# Patient Record
Sex: Male | Born: 1950 | Hispanic: Yes | Marital: Married | Smoking: Never smoker
Health system: Southern US, Community
[De-identification: ages and names within clinical notes are randomized; demographics above are authoritative.]

## PROBLEM LIST (undated history)

## (undated) DIAGNOSIS — K219 Gastro-esophageal reflux disease without esophagitis: Secondary | ICD-10-CM

## (undated) DIAGNOSIS — R112 Nausea with vomiting, unspecified: Secondary | ICD-10-CM

## (undated) DIAGNOSIS — D303 Benign neoplasm of bladder: Secondary | ICD-10-CM

## (undated) DIAGNOSIS — M1990 Unspecified osteoarthritis, unspecified site: Secondary | ICD-10-CM

## (undated) DIAGNOSIS — N281 Cyst of kidney, acquired: Secondary | ICD-10-CM

## (undated) DIAGNOSIS — T8859XA Other complications of anesthesia, initial encounter: Secondary | ICD-10-CM

## (undated) DIAGNOSIS — R3915 Urgency of urination: Secondary | ICD-10-CM

## (undated) DIAGNOSIS — Z9889 Other specified postprocedural states: Secondary | ICD-10-CM

## (undated) DIAGNOSIS — F32A Depression, unspecified: Secondary | ICD-10-CM

## (undated) DIAGNOSIS — Z87440 Personal history of urinary (tract) infections: Secondary | ICD-10-CM

## (undated) DIAGNOSIS — R102 Pelvic and perineal pain: Secondary | ICD-10-CM

## (undated) DIAGNOSIS — G4733 Obstructive sleep apnea (adult) (pediatric): Secondary | ICD-10-CM

## (undated) DIAGNOSIS — N3289 Other specified disorders of bladder: Secondary | ICD-10-CM

## (undated) DIAGNOSIS — Z9989 Dependence on other enabling machines and devices: Secondary | ICD-10-CM

## (undated) DIAGNOSIS — N3010 Interstitial cystitis (chronic) without hematuria: Secondary | ICD-10-CM

## (undated) DIAGNOSIS — R350 Frequency of micturition: Secondary | ICD-10-CM

## (undated) DIAGNOSIS — R011 Cardiac murmur, unspecified: Secondary | ICD-10-CM

## (undated) DIAGNOSIS — R351 Nocturia: Secondary | ICD-10-CM

## (undated) DIAGNOSIS — I10 Essential (primary) hypertension: Secondary | ICD-10-CM

## (undated) DIAGNOSIS — R3911 Hesitancy of micturition: Secondary | ICD-10-CM

## (undated) DIAGNOSIS — L570 Actinic keratosis: Secondary | ICD-10-CM

## (undated) HISTORY — DX: Interstitial cystitis (chronic) without hematuria: N30.10

## (undated) HISTORY — PX: KNEE ARTHROSCOPY: SUR90

## (undated) HISTORY — DX: Actinic keratosis: L57.0

## (undated) HISTORY — PX: TRANSURETHRAL RESECTION OF PROSTATE: SHX73

## (undated) HISTORY — PX: JOINT REPLACEMENT: SHX530

## (undated) HISTORY — PX: COLONOSCOPY: SHX174

## (undated) SURGERY — Surgical Case
Anesthesia: *Unknown

---

## 2009-11-20 HISTORY — PX: INCISION OF BLADDER NECK CONTRACTURE: SHX1811

## 2010-01-22 ENCOUNTER — Emergency Department: Payer: Self-pay | Admitting: Emergency Medicine

## 2010-07-28 ENCOUNTER — Emergency Department: Payer: Self-pay | Admitting: Emergency Medicine

## 2010-08-05 ENCOUNTER — Ambulatory Visit: Payer: Self-pay | Admitting: Urology

## 2010-08-24 ENCOUNTER — Ambulatory Visit: Payer: Self-pay | Admitting: Urology

## 2010-08-25 ENCOUNTER — Ambulatory Visit: Payer: Self-pay | Admitting: Urology

## 2010-10-28 ENCOUNTER — Ambulatory Visit: Payer: Self-pay | Admitting: Gastroenterology

## 2010-11-01 LAB — PATHOLOGY REPORT

## 2011-01-17 DIAGNOSIS — N400 Enlarged prostate without lower urinary tract symptoms: Secondary | ICD-10-CM | POA: Insufficient documentation

## 2011-01-17 DIAGNOSIS — R109 Unspecified abdominal pain: Secondary | ICD-10-CM

## 2011-01-17 HISTORY — DX: Unspecified abdominal pain: R10.9

## 2011-01-31 ENCOUNTER — Ambulatory Visit: Payer: Self-pay | Admitting: Urology

## 2011-04-04 DIAGNOSIS — E785 Hyperlipidemia, unspecified: Secondary | ICD-10-CM | POA: Insufficient documentation

## 2011-04-04 DIAGNOSIS — L989 Disorder of the skin and subcutaneous tissue, unspecified: Secondary | ICD-10-CM | POA: Insufficient documentation

## 2011-04-11 ENCOUNTER — Encounter (HOSPITAL_BASED_OUTPATIENT_CLINIC_OR_DEPARTMENT_OTHER): Payer: Self-pay | Admitting: Anesthesiology

## 2011-05-07 ENCOUNTER — Emergency Department: Payer: Self-pay | Admitting: Unknown Physician Specialty

## 2011-05-08 ENCOUNTER — Emergency Department: Payer: Self-pay | Admitting: Emergency Medicine

## 2011-05-30 ENCOUNTER — Other Ambulatory Visit: Payer: Self-pay | Admitting: Urology

## 2011-05-31 ENCOUNTER — Encounter (HOSPITAL_BASED_OUTPATIENT_CLINIC_OR_DEPARTMENT_OTHER): Payer: Self-pay | Admitting: *Deleted

## 2011-05-31 NOTE — Progress Notes (Addendum)
SPOKE W/ PT WIFE, SPEAKS ONLY SPANISH.  ALL INFO OBTAINED THRU PACIFIC INTERPRETOR [phone].  NPO AFTER MN. ARRIVES AT 1100. NEEDS ISTAT AND EKG. WILL TAKE AMLODIPINE AM OF SURG. W/ SIP OF WATER. PT TO BRING LIST OF MEDS.   SPANISH INTERPRETOR REQUESTED AND ARRANGED TO ARRIVE AT 1100 [phone]).

## 2011-06-01 NOTE — H&P (Signed)
History of Present Illness   [name] was here for urodynamics today. He has had 3 TUR procedures and he voids every hour because of pressure. The pressure is relieved when he voids. He is [place] he received his treatment. He reports a very slow dribbling flow and can almost leak on himself when the flow is slow. He has diffuse suprapubic discomfort that comes and goes. He is currently on Flomax and finasteride. He has had a stricture in the past.   He has tried Rapaflo. I had not seen a recent PSA and he had a small pea-size nodule on the right lateral lobe of the prostate.   He has had a negative cystoscopy and negative CT scan by Vincent Cole. I felt that he could have interstitial cystitis, but my index of suspicion was lower. He voids every 1 to 2 hours and gets up 4 or 5 times a night and has a slow flow. He has infrequent ankle edema.   Review of systems: No change in bowel or neurologic systems.  Unfortunately, I could insert a urodynamics catheter today. The nurses in urodynamics tried the usual techniques, including piggybacking the catheter with different size catheters and coud catheters. I tried the same and failed.   Cystoscopy: The patient underwent cystoscopy to assess for a bladder neck contracture. The penile bulbar and sphincter looked normal. He had a short prostatic urethra. He had a ski-jump effect at 6 o' clock with an open bladder neck.  I could quite readily insert a urodynamics catheter to the bladder neck, but I could not thread it into the bladder under cystoscopic guidance doing the piggyback type maneuver. Oddly, twice the urodynamics catheter would curl up in the penile urethra and with all techniques described above he seemed to have a tight urethra throughout his entire length. In my opinion, this explained why I could not thread the urodynamics catheter beside the cystoscopy.  Review of systems: No change in bowel or neurologic systems.    Past  Medical History Problems  1. History of  Adult Sleep Apnea 780.57 2. History of  Anxiety (Symptom) 300.00 3. History of  Hypercholesterolemia 272.0 4. History of  Hypertension 401.9 5. History of  Murmurs 785.2  Surgical History Problems  1. History of  Knee Surgery 2. History of  Transurethral Resection Of Prostate (TURP)  Current Meds 1. AmLODIPine Besylate 10 MG Oral Tablet; Therapy: (Recorded:10Oct2012) to 2. Finasteride TABS; Therapy: (Recorded:10Oct2012) to 3. Flomax 0.4 MG Oral Capsule; Therapy: (Recorded:10Oct2012) to 4. Ibuprofen CAPS; Therapy: (Recorded:10Oct2012) to  Allergies Medication  1. No Known Drug Allergies  Family History Problems  1. Maternal uncle's history of  Cancer 2. Maternal uncle's history of  Cancer  Social History Problems  1. Alcohol Use 2. Caffeine Use 3. Marital History - Currently Married 4. Never A Smoker 5. Occupation: Games developer Assessed  1. Urinary Stream Is Smaller 788.62 2. Abdominal Pain 789.00  Plan Urinary Stream Is Smaller (788.62)  1. Cysto  Done: 05Nov2012  Discussion/Summary Vincent Cole has a slow flow, but I could not perform urodynamics today.  He does not have a clinically significant stricture or bladder neck contracture.  With 3 previous TUR procedures, it is more doubtful that he still is obstructed.  His pain could be from an inflamed bladder.    Vincent Cole and I talked about a hydrodistension.  We talked about cystoscopy/hydrodistension and instillation in detail. Pros, cons, general surgical and anesthetic risks, and other  options including watchful waiting were discussed. Risks were described but not limited to pain, infection, and bleeding. The risk of bladder perforation and management were discussed. The patient understands that it is primarily a diagnostic procedure.   He would like to proceed with a hydrodistension. I went over his flow symptoms in detail and he says on a few occasions he  has actually gone into retention and he would his abdominal pain syndrome and the retention sorted out. For this reason, we are going to attempt the following.  After his hydrodistension, I am going to place a urodynamics catheter in him. We will tape it to the penis. After he wakes up and eats, etc., we will do urodynamics following, recognizing some of the limitations associated with this. It should not particularly effect the voiding phase.  After a thorough review of the management options for the patient's condition the patient  elected to proceed with surgical therapy as noted above. We have discussed the potential benefits and risks of the procedure, side effects of the proposed treatment, the likelihood of the patient achieving the goals of the procedure, and any potential problems that might occur during the procedure or recuperation. Informed consent has been obtained.

## 2011-06-02 ENCOUNTER — Encounter (HOSPITAL_BASED_OUTPATIENT_CLINIC_OR_DEPARTMENT_OTHER): Payer: Self-pay | Admitting: Anesthesiology

## 2011-06-02 ENCOUNTER — Ambulatory Visit (HOSPITAL_BASED_OUTPATIENT_CLINIC_OR_DEPARTMENT_OTHER): Admission: RE | Admit: 2011-06-02 | Payer: Self-pay | Source: Ambulatory Visit | Admitting: Urology

## 2011-06-02 ENCOUNTER — Encounter (HOSPITAL_BASED_OUTPATIENT_CLINIC_OR_DEPARTMENT_OTHER): Payer: Self-pay | Admitting: *Deleted

## 2011-06-02 ENCOUNTER — Encounter (HOSPITAL_BASED_OUTPATIENT_CLINIC_OR_DEPARTMENT_OTHER): Admission: RE | Payer: Self-pay | Source: Ambulatory Visit

## 2011-06-02 ENCOUNTER — Ambulatory Visit (HOSPITAL_BASED_OUTPATIENT_CLINIC_OR_DEPARTMENT_OTHER): Payer: Federal, State, Local not specified - PPO | Admitting: Anesthesiology

## 2011-06-02 ENCOUNTER — Other Ambulatory Visit: Payer: Self-pay

## 2011-06-02 ENCOUNTER — Encounter (HOSPITAL_BASED_OUTPATIENT_CLINIC_OR_DEPARTMENT_OTHER)
Admission: RE | Disposition: A | Discharge status: Other Acute Inpatient Hospital | Payer: Self-pay | Source: Ambulatory Visit | Attending: Urology

## 2011-06-02 ENCOUNTER — Ambulatory Visit (HOSPITAL_BASED_OUTPATIENT_CLINIC_OR_DEPARTMENT_OTHER)
Admission: RE | Admit: 2011-06-02 | Discharge: 2011-06-02 | Payer: Federal, State, Local not specified - PPO | Source: Ambulatory Visit | Attending: Urology | Admitting: Urology

## 2011-06-02 DIAGNOSIS — G4730 Sleep apnea, unspecified: Secondary | ICD-10-CM | POA: Insufficient documentation

## 2011-06-02 DIAGNOSIS — R39198 Other difficulties with micturition: Secondary | ICD-10-CM | POA: Insufficient documentation

## 2011-06-02 DIAGNOSIS — N3010 Interstitial cystitis (chronic) without hematuria: Secondary | ICD-10-CM | POA: Insufficient documentation

## 2011-06-02 DIAGNOSIS — R109 Unspecified abdominal pain: Secondary | ICD-10-CM | POA: Insufficient documentation

## 2011-06-02 DIAGNOSIS — E7800 Pure hypercholesterolemia, unspecified: Secondary | ICD-10-CM | POA: Insufficient documentation

## 2011-06-02 DIAGNOSIS — I10 Essential (primary) hypertension: Secondary | ICD-10-CM | POA: Insufficient documentation

## 2011-06-02 DIAGNOSIS — Z79899 Other long term (current) drug therapy: Secondary | ICD-10-CM | POA: Insufficient documentation

## 2011-06-02 HISTORY — DX: Dependence on other enabling machines and devices: Z99.89

## 2011-06-02 HISTORY — DX: Essential (primary) hypertension: I10

## 2011-06-02 HISTORY — DX: Personal history of urinary (tract) infections: Z87.440

## 2011-06-02 HISTORY — DX: Obstructive sleep apnea (adult) (pediatric): G47.33

## 2011-06-02 HISTORY — DX: Gastro-esophageal reflux disease without esophagitis: K21.9

## 2011-06-02 HISTORY — DX: Nocturia: R35.1

## 2011-06-02 HISTORY — DX: Cyst of kidney, acquired: N28.1

## 2011-06-02 HISTORY — DX: Benign neoplasm of bladder: D30.3

## 2011-06-02 HISTORY — DX: Pelvic and perineal pain: R10.2

## 2011-06-02 HISTORY — DX: Urgency of urination: R39.15

## 2011-06-02 HISTORY — DX: Frequency of micturition: R35.0

## 2011-06-02 HISTORY — DX: Hesitancy of micturition: R39.11

## 2011-06-02 HISTORY — PX: CYSTO WITH HYDRODISTENSION: SHX5453

## 2011-06-02 HISTORY — DX: Other specified disorders of bladder: N32.89

## 2011-06-02 LAB — POCT I-STAT 4, (NA,K, GLUC, HGB,HCT)
Glucose, Bld: 97 mg/dL (ref 70–99)
Hemoglobin: 13.9 g/dL (ref 13.0–17.0)
Potassium: 3.8 mEq/L (ref 3.5–5.1)
Sodium: 142 mEq/L (ref 135–145)

## 2011-06-02 SURGERY — CYSTOSCOPY, WITH BLADDER HYDRODISTENSION
Anesthesia: General | Site: Bladder | Wound class: Clean Contaminated

## 2011-06-02 SURGERY — CYSTOSCOPY, WITH BLADDER HYDRODISTENSION
Anesthesia: General

## 2011-06-02 MED ORDER — LACTATED RINGERS IV SOLN
INTRAVENOUS | Status: DC
  Administered 2011-06-02: 11:00:00 via INTRAVENOUS

## 2011-06-02 MED ORDER — PROPOFOL 10 MG/ML IV EMUL
INTRAVENOUS | Status: DC | PRN
  Administered 2011-06-02: 200 mg via INTRAVENOUS

## 2011-06-02 MED ORDER — CIPROFLOXACIN HCL 250 MG PO TABS: 250.0000 mg | ORAL_TABLET | Freq: Two times a day (BID) | ORAL | Status: AC

## 2011-06-02 MED ORDER — IOHEXOL 350 MG/ML SOLN
INTRAVENOUS | Status: DC | PRN
  Administered 2011-06-02: 5 mL via INTRAVENOUS

## 2011-06-02 MED ORDER — DEXAMETHASONE SODIUM PHOSPHATE 4 MG/ML IJ SOLN
INTRAMUSCULAR | Status: DC | PRN
  Administered 2011-06-02: 8 mg via INTRAVENOUS

## 2011-06-02 MED ORDER — FENTANYL CITRATE 0.05 MG/ML IJ SOLN
INTRAMUSCULAR | Status: DC | PRN
  Administered 2011-06-02 (×2): 50 ug via INTRAVENOUS

## 2011-06-02 MED ORDER — HYDROCODONE-ACETAMINOPHEN 5-500 MG PO TABS: 1.0000 | ORAL_TABLET | Freq: Four times a day (QID) | ORAL | Status: AC | PRN

## 2011-06-02 MED ORDER — PROMETHAZINE HCL 25 MG/ML IJ SOLN: 6.2500 mg | INTRAMUSCULAR | Status: DC | PRN

## 2011-06-02 MED ORDER — PHENAZOPYRIDINE HCL 200 MG PO TABS: ORAL | Status: DC | PRN

## 2011-06-02 MED ORDER — LIDOCAINE HCL (CARDIAC) 20 MG/ML IV SOLN
INTRAVENOUS | Status: DC | PRN
  Administered 2011-06-02: 100 mg via INTRAVENOUS

## 2011-06-02 MED ORDER — LACTATED RINGERS IV SOLN: INTRAVENOUS | Status: DC

## 2011-06-02 MED ORDER — STERILE WATER FOR IRRIGATION IR SOLN
Status: DC | PRN
  Administered 2011-06-02: 3000 mL

## 2011-06-02 MED ORDER — MIDAZOLAM HCL 5 MG/5ML IJ SOLN
INTRAMUSCULAR | Status: DC | PRN
  Administered 2011-06-02: 2 mg via INTRAVENOUS

## 2011-06-02 MED ORDER — FENTANYL CITRATE 0.05 MG/ML IJ SOLN: 25.0000 ug | INTRAMUSCULAR | Status: DC | PRN

## 2011-06-02 MED ORDER — CIPROFLOXACIN IN D5W 400 MG/200ML IV SOLN
400.0000 mg | INTRAVENOUS | Status: AC
  Administered 2011-06-02: 400 mg via INTRAVENOUS

## 2011-06-02 MED ORDER — MEPERIDINE HCL 25 MG/ML IJ SOLN: 6.2500 mg | INTRAMUSCULAR | Status: DC | PRN

## 2011-06-02 MED ORDER — ONDANSETRON HCL 4 MG/2ML IJ SOLN
INTRAMUSCULAR | Status: DC | PRN
  Administered 2011-06-02: 4 mg via INTRAVENOUS

## 2011-06-02 SURGICAL SUPPLY — 20 items
BAG DRAIN URO-CYSTO SKYTR STRL (DRAIN) ×2 IMPLANT
CANISTER SUCT LVC 12 LTR MEDI- (MISCELLANEOUS) IMPLANT
CATH FOLEY 2WAY SLVR  5CC 18FR (CATHETERS)
CATH FOLEY 2WAY SLVR 5CC 18FR (CATHETERS) IMPLANT
CATH ROBINSON RED A/P 12FR (CATHETERS) IMPLANT
CATH ROBINSON RED A/P 14FR (CATHETERS) IMPLANT
CLOTH BEACON ORANGE TIMEOUT ST (SAFETY) ×2 IMPLANT
DRAPE CAMERA CLOSED 9X96 (DRAPES) ×2 IMPLANT
ELECT REM PT RETURN 9FT ADLT (ELECTROSURGICAL)
ELECTRODE REM PT RTRN 9FT ADLT (ELECTROSURGICAL) IMPLANT
GLOVE BIO SURGEON STRL SZ7.5 (GLOVE) ×2 IMPLANT
GLOVE ECLIPSE 6.0 STRL STRAW (GLOVE) ×2 IMPLANT
GLOVE INDICATOR 6.5 STRL GRN (GLOVE) ×2 IMPLANT
GOWN STRL REIN XL XLG (GOWN DISPOSABLE) ×2 IMPLANT
NDL SAFETY ECLIPSE 18X1.5 (NEEDLE) ×1 IMPLANT
NEEDLE HYPO 18GX1.5 SHARP (NEEDLE) ×1
PACK CYSTOSCOPY (CUSTOM PROCEDURE TRAY) ×2 IMPLANT
SUT SILK 0 TIES 10X30 (SUTURE) IMPLANT
SYR 20CC LL (SYRINGE) ×2 IMPLANT
WATER STERILE IRR 3000ML UROMA (IV SOLUTION) ×2 IMPLANT

## 2011-06-02 NOTE — Transfer of Care (Signed)
Immediate Anesthesia Transfer of Care Note  Patient: Vincent Cole  Procedure(s) Performed:  CYSTOSCOPY/HYDRODISTENSION - catheter placement  Patient Location: PACU  Anesthesia Type: General  Level of Consciousness: awake, oriented, sedated and patient cooperative  Airway & Oxygen Therapy: Patient Spontanous Breathing and Patient connected to face mask oxygen  Post-op Assessment: Report given to PACU RN and Post -op Vital signs reviewed and stable  Post vital signs: Reviewed and stable  Complications: No apparent anesthesia complications

## 2011-06-02 NOTE — Anesthesia Procedure Notes (Signed)
Procedure Name: LMA Insertion Date/Time: 06/02/2011 12:01 PM Performed by: Renella Cunas D Pre-anesthesia Checklist: Patient identified, Emergency Drugs available, Suction available and Patient being monitored Patient Re-evaluated:Patient Re-evaluated prior to inductionOxygen Delivery Method: Circle System Utilized Preoxygenation: Pre-oxygenation with 100% oxygen Intubation Type: IV induction Ventilation: Mask ventilation without difficulty LMA: LMA inserted LMA Size: 4.0 Number of attempts: 1 Airway Equipment and Method: bite block Placement Confirmation: positive ETCO2 Tube secured with: Tape Dental Injury: Teeth and Oropharynx as per pre-operative assessment

## 2011-06-02 NOTE — Anesthesia Preprocedure Evaluation (Signed)
Anesthesia Evaluation  Patient identified by MRN, date of birth, ID band Patient awake    Reviewed: Allergy & Precautions, H&P , NPO status , Patient's Chart, lab work & pertinent test results  Airway Mallampati: II TM Distance: >3 FB Neck ROM: Full    Dental No notable dental hx.    Pulmonary neg pulmonary ROS, sleep apnea and Continuous Positive Airway Pressure Ventilation ,  clear to auscultation  Pulmonary exam normal       Cardiovascular hypertension, Pt. on medications neg cardio ROS Regular Normal    Neuro/Psych Negative Neurological ROS  Negative Psych ROS   GI/Hepatic negative GI ROS, Neg liver ROS,   Endo/Other  Negative Endocrine ROS  Renal/GU negative Renal ROS  Genitourinary negative   Musculoskeletal negative musculoskeletal ROS (+)   Abdominal   Peds negative pediatric ROS (+)  Hematology negative hematology ROS (+)   Anesthesia Other Findings   Reproductive/Obstetrics negative OB ROS                           Anesthesia Physical Anesthesia Plan  ASA: II  Anesthesia Plan: General   Post-op Pain Management:    Induction: Intravenous  Airway Management Planned: LMA  Additional Equipment:   Intra-op Plan:   Post-operative Plan: Extubation in OR  Informed Consent: I have reviewed the patients History and Physical, chart, labs and discussed the procedure including the risks, benefits and alternatives for the proposed anesthesia with the patient or authorized representative who has indicated his/her understanding and acceptance.   Dental advisory given  Plan Discussed with: CRNA  Anesthesia Plan Comments:         Anesthesia Quick Evaluation

## 2011-06-02 NOTE — Anesthesia Postprocedure Evaluation (Signed)
  Anesthesia Post-op Note  Patient: Vincent Cole  Procedure(s) Performed:  CYSTOSCOPY/HYDRODISTENSION - catheter placement  Patient Location: PACU  Anesthesia Type: General  Level of Consciousness: awake and alert   Airway and Oxygen Therapy: Patient Spontanous Breathing  Post-op Pain: mild  Post-op Assessment: Post-op Vital signs reviewed, Patient's Cardiovascular Status Stable, Respiratory Function Stable, Patent Airway and No signs of Nausea or vomiting  Post-op Vital Signs: stable  Complications: No apparent anesthesia complications

## 2011-06-02 NOTE — Interval H&P Note (Signed)
History and Physical Interval Note:  06/02/2011 7:04 AM  Vincent Cole  has presented today for surgery, with the diagnosis of PELVIC PAIN  The various methods of treatment have been discussed with the patient and family. After consideration of risks, benefits and other options for treatment, the patient has consented to  Procedure(s): CYSTOSCOPY/HYDRODISTENSION as a surgical intervention .  The patients' history has been reviewed, patient examined, no change in status, stable for surgery.  I have reviewed the patients' chart and labs.  Questions were answered to the patient's satisfaction.     [name]

## 2011-06-02 NOTE — Op Note (Signed)
Preoperative diagnosis: Pelvic pain and poor flow Postoperative diagnosis: Interstitial cystitis and poor flow Surgery: Bladder hydrodistention plus cystoscopy plus insertion of urodynamics catheter and cystogram Surgeon Dr. Lorin Picket Lamanda Rudder  This patient has chronic pelvic pain and I was trying to sort out of the initial cystitis. Preoperative laboratory tests were normal. Leg position was good. Preoperative antibiotics were given  He had mild meatal stenosis a 52 [place] scope was utilized. The penile bulbar membranous and prostatic urethra were visualized. Distally all urethra was normal. He had a very deep ski jump at the bladder neck the 2 previous transurethral resections of the prostate. He grade 1 or grade 2/4 bladder trabeculation. There is no stitch form body or carcinoma.  He was hydrodistended to 550 mL. The bladder was emptied. On reinspection he mild diffuse glomerulations but no active bleeding and no injury  Because of the anatomy I cystoscoped I. urodynamics catheter up into the bladder neck curling in the bladder.  A separate procedure I did a quick cystogram using some contrast and there is no question the length of the urinary catheter was curled nicely in his bladder. There were no bladder abnormalities or reflux. I taped securely to the penis with my usual technique.  Patient was sent to recovery room will have urodynamics saphena

## 2011-06-03 ENCOUNTER — Encounter (HOSPITAL_BASED_OUTPATIENT_CLINIC_OR_DEPARTMENT_OTHER): Payer: Self-pay | Admitting: Urology

## 2011-06-08 ENCOUNTER — Encounter (HOSPITAL_BASED_OUTPATIENT_CLINIC_OR_DEPARTMENT_OTHER): Payer: Self-pay

## 2011-07-04 DIAGNOSIS — N3010 Interstitial cystitis (chronic) without hematuria: Secondary | ICD-10-CM | POA: Insufficient documentation

## 2012-07-25 ENCOUNTER — Emergency Department: Payer: Self-pay

## 2013-05-02 ENCOUNTER — Ambulatory Visit (INDEPENDENT_AMBULATORY_CARE_PROVIDER_SITE_OTHER): Payer: Federal, State, Local not specified - PPO | Admitting: Podiatry

## 2013-05-02 ENCOUNTER — Ambulatory Visit (INDEPENDENT_AMBULATORY_CARE_PROVIDER_SITE_OTHER): Payer: Federal, State, Local not specified - PPO

## 2013-05-02 ENCOUNTER — Encounter: Payer: Self-pay | Admitting: Podiatry

## 2013-05-02 VITALS — BP 164/97 | HR 98 | Resp 18 | Ht 65.5 in | Wt 244.0 lb

## 2013-05-02 DIAGNOSIS — M79671 Pain in right foot: Secondary | ICD-10-CM

## 2013-05-02 DIAGNOSIS — G5790 Unspecified mononeuropathy of unspecified lower limb: Secondary | ICD-10-CM

## 2013-05-02 DIAGNOSIS — G5791 Unspecified mononeuropathy of right lower limb: Secondary | ICD-10-CM

## 2013-05-02 DIAGNOSIS — M19079 Primary osteoarthritis, unspecified ankle and foot: Secondary | ICD-10-CM

## 2013-05-02 DIAGNOSIS — L608 Other nail disorders: Secondary | ICD-10-CM

## 2013-05-02 DIAGNOSIS — M79609 Pain in unspecified limb: Secondary | ICD-10-CM

## 2013-05-02 MED ORDER — MELOXICAM 7.5 MG PO TABS: 7.5000 mg | ORAL_TABLET | Freq: Every day | ORAL | Status: DC

## 2013-05-02 NOTE — Patient Instructions (Signed)

## 2013-05-02 NOTE — Progress Notes (Signed)
   Subjective:    Patient ID: Vincent Cole, male    DOB: [birth year], 62 y.o.   MRN: [id number]  HPI Comments: " i have a bunion on my right foot that makes my toe feel numb, also my left foot sometimes gets a dull ache in it .  N numb    Dull ache  Fungus on left great toenail  L right foot , left foot top of foot dull ache  D 3 years ago , couple of months ago for the left foot  O gradual,    All of a sudden  C progressively getting worse,  The left foot it does not happen all the time  A standing for long period of time  T no treatment   Foot Pain      Review of Systems  All other systems reviewed and are negative.       Objective:   Physical Exam: I reviewed his past medical history medications allergies surgeries social history. Vital signs are stable he is alert and oriented x3. Vascular evaluation demonstrates strong palpable pulses bilateral. Capillary fill time to digits one through 5 is noted to be immediate. Neurologic sensorium is intact per Semmes-Weinstein monofilament. Deep tendon reflexes are intact and brisk bilateral. Muscle strength is 5 over 5 dorsiflexors plantar flexors inverters everters all intrinsic musculature is intact. Orthopedic evaluation demonstrates all joints distal to the ankle a full range of motion without crepitus he has pain on frontal plane range of motion of the midfoot left. He has numbness on palpation from the medial IP joint of the hallux distally to the tuft of the toe right. He can use evaluation demonstrates supple while hydrated cutis hallux nails are thick yellow dystrophic care rule out mycosis at this point. Radiographs evaluation left foot demonstrates early osteoarthritic changes. Mild hallux abductovalgus deformity right possibly associated with his numbness and tingling.        Assessment & Plan:  Assessment: Neuritis right IP joint hallux. 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  Plan: Discussed etiology pathology conservative versus surgical therapies. Injected a small amount of dexamethasone and local anesthetic to the medial aspect of the hallux right for the neuritis. Should samples of both great toenails send for mycotic evaluation. I wrote her prescription for Mobic 7.5 mg 1 by mouth daily

## 2013-06-10 ENCOUNTER — Telehealth: Payer: Self-pay | Admitting: *Deleted

## 2013-06-10 NOTE — Telephone Encounter (Signed)
Left message for patient to give [place] a call regarding his results of toenail

## 2013-06-12 ENCOUNTER — Telehealth: Payer: Self-pay | Admitting: *Deleted

## 2013-06-12 NOTE — Telephone Encounter (Signed)
CALLED CELL PHONE IN WHICH A VOICEMAIL WAS NOT SET UP TO LEAVE A MESSAGE FOR HIM REGARDING NUVAIL OR GIVE RESULT OF HIS TOENAIL

## 2013-06-19 ENCOUNTER — Encounter: Payer: Self-pay | Admitting: Podiatry

## 2013-06-20 ENCOUNTER — Telehealth: Payer: Self-pay | Admitting: *Deleted

## 2013-06-20 MED ORDER — NUVAIL EX SOLN: CUTANEOUS | Status: DC

## 2013-06-20 NOTE — Telephone Encounter (Signed)
Spoke to patient explained to him that his nail came back negative for fungus , but we can go ahead and try a new prescription for nuvail. Pt agree and will be by the office to pick up discount card for the medication

## 2015-05-12 ENCOUNTER — Ambulatory Visit: Payer: Self-pay | Admitting: Urology

## 2015-05-21 DIAGNOSIS — I10 Essential (primary) hypertension: Secondary | ICD-10-CM | POA: Insufficient documentation

## 2015-05-21 DIAGNOSIS — M1712 Unilateral primary osteoarthritis, left knee: Secondary | ICD-10-CM | POA: Insufficient documentation

## 2015-06-02 ENCOUNTER — Ambulatory Visit: Payer: Self-pay | Admitting: Urology

## 2015-06-02 ENCOUNTER — Encounter: Payer: Self-pay | Admitting: *Deleted

## 2015-06-02 ENCOUNTER — Encounter: Payer: Self-pay | Admitting: Urology

## 2015-07-06 ENCOUNTER — Emergency Department
Admission: EM | Admit: 2015-07-06 | Discharge: 2015-07-06 | Disposition: A | Discharge status: Home / Self Care | Payer: Federal, State, Local not specified - PPO | Attending: Emergency Medicine | Admitting: Emergency Medicine

## 2015-07-06 ENCOUNTER — Encounter: Payer: Self-pay | Admitting: Emergency Medicine

## 2015-07-06 DIAGNOSIS — Z791 Long term (current) use of non-steroidal anti-inflammatories (NSAID): Secondary | ICD-10-CM | POA: Insufficient documentation

## 2015-07-06 DIAGNOSIS — Z79899 Other long term (current) drug therapy: Secondary | ICD-10-CM | POA: Insufficient documentation

## 2015-07-06 DIAGNOSIS — I10 Essential (primary) hypertension: Secondary | ICD-10-CM | POA: Insufficient documentation

## 2015-07-06 DIAGNOSIS — N3090 Cystitis, unspecified without hematuria: Secondary | ICD-10-CM | POA: Diagnosis not present

## 2015-07-06 DIAGNOSIS — N3000 Acute cystitis without hematuria: Secondary | ICD-10-CM

## 2015-07-06 DIAGNOSIS — R339 Retention of urine, unspecified: Secondary | ICD-10-CM

## 2015-07-06 DIAGNOSIS — R509 Fever, unspecified: Secondary | ICD-10-CM

## 2015-07-06 LAB — URINALYSIS COMPLETE WITH MICROSCOPIC (ARMC ONLY)
BILIRUBIN URINE: NEGATIVE
GLUCOSE, UA: NEGATIVE mg/dL
KETONES UR: NEGATIVE mg/dL
Nitrite: NEGATIVE
Protein, ur: NEGATIVE mg/dL
Specific Gravity, Urine: 1.003 — ABNORMAL LOW (ref 1.005–1.030)
pH: 8 (ref 5.0–8.0)

## 2015-07-06 LAB — CBC
HCT: 40.5 % (ref 40.0–52.0)
Hemoglobin: 13.6 g/dL (ref 13.0–18.0)
MCH: 29.3 pg (ref 26.0–34.0)
MCHC: 33.6 g/dL (ref 32.0–36.0)
MCV: 87.2 fL (ref 80.0–100.0)
PLATELETS: 278 10*3/uL (ref 150–440)
RBC: 4.65 MIL/uL (ref 4.40–5.90)
RDW: 13.6 % (ref 11.5–14.5)
WBC: 18 10*3/uL — ABNORMAL HIGH (ref 3.8–10.6)

## 2015-07-06 LAB — BASIC METABOLIC PANEL
Anion gap: 2 — ABNORMAL LOW (ref 5–15)
BUN: 14 mg/dL (ref 6–20)
CALCIUM: 8.5 mg/dL — AB (ref 8.9–10.3)
CO2: 30 mmol/L (ref 22–32)
CREATININE: 0.95 mg/dL (ref 0.61–1.24)
Chloride: 104 mmol/L (ref 101–111)
GFR calc Af Amer: >60 mL/min (ref >60)
GLUCOSE: 105 mg/dL — AB (ref 65–99)
POTASSIUM: 4.2 mmol/L (ref 3.5–5.1)
SODIUM: 136 mmol/L (ref 135–145)

## 2015-07-06 MED ORDER — SODIUM CHLORIDE 0.9 % IV BOLUS (SEPSIS)
1000.0000 mL | Freq: Once | INTRAVENOUS | Status: AC
  Administered 2015-07-06: 1000 mL via INTRAVENOUS
  Filled 2015-07-06: qty 1000

## 2015-07-06 MED ORDER — SULFAMETHOXAZOLE-TRIMETHOPRIM 800-160 MG PO TABS: 1.0000 | ORAL_TABLET | Freq: Two times a day (BID) | ORAL | Status: DC

## 2015-07-06 MED ORDER — DEXTROSE 5 % IV SOLN
1.0000 g | Freq: Once | INTRAVENOUS | Status: AC
  Administered 2015-07-06: 1 g via INTRAVENOUS
  Filled 2015-07-06: qty 10

## 2015-07-06 MED ORDER — ONDANSETRON 4 MG PO TBDP: 4.0000 mg | ORAL_TABLET | Freq: Three times a day (TID) | ORAL | Status: DC | PRN

## 2015-07-06 MED ORDER — LIDOCAINE HCL 2 % EX GEL
1.0000 "application " | Freq: Once | CUTANEOUS | Status: AC
  Administered 2015-07-06: 1 via URETHRAL
  Filled 2015-07-06: qty 5

## 2015-07-06 NOTE — ED Notes (Signed)
C/o pelvic pain and urinary retention

## 2015-07-06 NOTE — ED Notes (Signed)
Attempted to place foley catheter.  Pt tolerated well.  However, unable to fill balloon in catheter, pt reported severe burning and I was met with resistance during catheter insertion.  Output 100 cc from foley catheter.  Catheter removed due to inability to fill balloon.  Will notify md of same.

## 2015-07-06 NOTE — Discharge Instructions (Signed)
Please drink plenty of fluid to stay well hydrated. Please take the entire course of antibiotics, even if you're feeling well. Please make a follow-up appointment with your urologist in [place] that they can reevaluate you and follow-up your urine culture.  Return to the emergency department if you develop severe pain, fever, inability to keep down fluids, or any other symptoms concerning to you.  Retencin urinaria aguda, hombres (Acute Urinary Retention, Male) La retencin urinaria aguda es la incapacidad transitoria para Garment/textile technologist. Es un problema muy frecuente en los hombre de [place]. A medida que el hombre envejece, la prstata se agranda y bloquea el flujo de orina que proviene de la vejiga. Generalmente es un problema que surge gradualmente.  INSTRUCCIONES PARA EL CUIDADO EN EL HOGAR Si van a enviarlo a su casa con un catter Foley y un sistema de drenaje, necesitar resolver cul ser el mejor curso de accin junto con su mdico. Mientras el catter est colocado, mantenga una buena ingesta de lquidos. Mantenga la bolsa de drenaje vaca y en posicin ms baja que el catter. De este modo, la orina contaminada no fluir hacia atrs, hacia la vejiga, lo que podra causar una infeccin urinaria. Hay dos tipos principales de bolsa de [place]. Una es una bolsa grande que generalmente se utiliza por la noche. Tiene una buena capacidad, lo que permitir dormir toda la noche sin Merchant navy officer. El segundo tipo se llama bolsa de pierna. Tiene menos capacidad por lo tanto necesita vaciarse con ms frecuencia. Sin embargo, la ventaja principal es que puede adherirse con una correa a la pierna y puede ir debajo de la ropa, permitiendo la libertad para moverse o dejar su casa. Utilice los medicamentos de venta libre o recetados para Glass blower/designer, Health and safety inspector o la fiebre, segn se lo indique el mdico.  SOLICITE ATENCIN MDICA SI:  Tiene fiebre baja.  Siente espasmos o prdidas de orina con los  espasmos. SOLICITE ATENCIN MDICA DE INMEDIATO SI:   Siente escalofros o le sube la fiebre.  El catter deja de drenar [place].  El catter se Therapist, occupational.  Aumenta el sangrado y no cesa con reposo ni al aumentar la ingesta de lquidos. ASEGRESE DE QUE:  Comprende estas instrucciones.  Controlar su afeccin.  Recibir ayuda de inmediato si no mejora o si empeora.   Esta informacin no tiene Marine scientist el consejo del mdico. Asegrese de hacerle al mdico cualquier pregunta que tenga.   Document Released: 02/16/2005 Document Revised: 09/23/2014 Elsevier Interactive Patient Education [employer].

## 2015-07-06 NOTE — ED Notes (Signed)
Dr Mariea Clonts made aware of results with catheter,  No further orders received regarding catheter placement.

## 2015-07-06 NOTE — ED Provider Notes (Signed)
[facility] Emergency Department Provider Note  ____________________________________________  Time seen: Approximately 8:02 AM  I have reviewed the triage vital signs and the nursing notes.   HISTORY  Chief Complaint Urinary Retention    HPI Vincent Cole is a 65 y.o. male with a long history of BPH status post TURP 3, recurrent urinary retention, presenting with suprapubic pressure, urinary retention, fever and decreased stream.Patient reports that after knee surgery in December, he developed urinary retention and UTI requiring indwelling Foley that was removed 01/24. Since then he has been on antibiotics to treat UTI, which completed 4 days ago. 3 days ago he began to notice a weakened stream and yesterday had a period of several hours where he had the urge to urinate but was unable to do so. This morning he has a weakened stream but continues to have suprapubic pressure. He did have a fever to 102.0 this weekend. No nausea or vomiting. No diarrhea.   Past Medical History  Diagnosis Date  . Pelvic pain in male   . Benign bladder mass   . Simple renal cyst   . Frequency of urination   . Urgency of urination   . Urinary hesitancy   . Nocturia   . OSA on CPAP nightly  . Hypertension   . History of recurrent UTIs   . GERD (gastroesophageal reflux disease) no meds  . IC (interstitial cystitis)     There are no active problems to display for this patient.   Past Surgical History  Procedure Laterality Date  . Cysto surg. for bladder/prostate issues  X3   LAST ONE MAY 2012  . Cysto with hydrodistension  06/02/2011    Procedure: CYSTOSCOPY/HYDRODISTENSION;  Surgeon: Reece Packer, MD;  Location: [facility];  Service: Urology;  Laterality: N/A;  catheter placement    Current Outpatient Rx  Name  Route  Sig  Dispense  Refill  . acetaminophen (TYLENOL) 500 MG tablet   Oral   Take 500 mg by mouth every 6 (six) hours as needed.            Marland Kitchen amLODipine-benazepril (LOTREL) 5-20 MG per capsule   Oral   Take 1 capsule by mouth daily.         . Dermatological Products, Misc. (NUVAIL) SOLN      APPLY  A THIN LAYER TO AFFECTED NAILS ONCE DAILY BEFORE BEDTIME   1 Bottle   12     Dispense as written.   Marland Kitchen ibuprofen (ADVIL,MOTRIN) 200 MG tablet   Oral   Take 200 mg by mouth every 6 (six) hours as needed.           . meloxicam (MOBIC) 7.5 MG tablet   Oral   Take 1 tablet (7.5 mg total) by mouth daily.   30 tablet   5   . Multiple Vitamin (MULTIVITAMIN) tablet   Oral   Take 1 tablet by mouth daily.           . Tamsulosin HCl (FLOMAX) 0.4 MG CAPS   Oral   Take 0.4 mg by mouth daily.           Allergies Review of patient's allergies indicates no known allergies.  History reviewed. No pertinent family history.  Social History Social History  Substance Use Topics  . Smoking status: Never Smoker   . Smokeless tobacco: Never Used  . Alcohol Use: Yes     Comment: OCCASIONAL    Review of Systems Constitutional: Positive  fever. Eyes: No visual changes. ENT: No sore throat. No rhinorrhea. Cardiovascular: Denies chest pain, palpitations. Respiratory: Denies shortness of breath.  No cough. Gastrointestinal: Positive suprapubic abdominal pain.  No nausea, no vomiting.  No diarrhea.  No constipation. Genitourinary: Positive urinary retention, weakened stream. Musculoskeletal: Negative for back pain. Skin: Negative for rash. Neurological: Negative for headaches, focal weakness or numbness.  10-point ROS otherwise negative.  ____________________________________________   PHYSICAL EXAM:  VITAL SIGNS: ED Triage Vitals  Enc Vitals Group     BP 07/06/15 0739 123/81 mmHg     Pulse Rate 07/06/15 0739 87     Resp 07/06/15 0739 18     Temp 07/06/15 0739 98.7 F (37.1 C)     Temp Source 07/06/15 0739 Oral     SpO2 07/06/15 0739 99 %     Weight 07/06/15 0739 220 lb (99.791 kg)     Height 07/06/15  0739 5\' 4"  (1.626 m)     Head Cir --      Peak Flow --      Pain Score 07/06/15 0740 5     Pain Loc --      Pain Edu? --      Excl. in [place]? --     Constitutional: Alert and oriented. Well appearing and in no acute distress. Answer question appropriately. Eyes: Conjunctivae are normal.  EOMI. Head: Atraumatic. Nose: No congestion/rhinnorhea. Mouth/Throat: Mucous membranes are moist.  Neck: No stridor.  Supple.   Cardiovascular: Normal rate, regular rhythm. No murmurs, rubs or gallops.  Respiratory: Normal respiratory effort.  No retractions. Lungs CTAB.  No wheezes, rales or ronchi. Gastrointestinal: Abdomen is soft and nondistended. Patient has a palpable bladder edge 8 cm above the pubic symphysis. No rebound or guarding, no peritoneal signs. Genitourinary: Normal-appearing uncircumcised penis. Normal scrotal and testicular exam. Musculoskeletal: No LE edema.  Neurologic:  Normal speech and language. No gross focal neurologic deficits are appreciated.  Skin:  Skin is warm, dry and intact. No rash noted. Psychiatric: Mood and affect are normal. Speech and behavior are normal.  Normal judgement.  ____________________________________________   LABS (all labs ordered are listed, but only abnormal results are displayed)  Labs Reviewed  URINALYSIS COMPLETEWITH MICROSCOPIC ([place]) - Abnormal; Notable for the following:    Color, Urine STRAW (*)    APPearance CLEAR (*)    Specific Gravity, Urine 1.003 (*)    Hgb urine dipstick 2+ (*)    Leukocytes, UA 3+ (*)    Bacteria, UA MANY (*)    Squamous Epithelial / LPF 0-5 (*)    All other components within normal limits  CBC - Abnormal; Notable for the following:    WBC 18.0 (*)    All other components within normal limits  BASIC METABOLIC PANEL - Abnormal; Notable for the following:    Glucose, Bld 105 (*)    Calcium 8.5 (*)    Anion gap 2 (*)    All other components within normal limits  URINE CULTURE    ____________________________________________  EKG  Not indicated ____________________________________________  RADIOLOGY  No results found.  ____________________________________________   PROCEDURES  Procedure(s) performed: None  Critical Care performed: No ____________________________________________   INITIAL IMPRESSION / ASSESSMENT AND PLAN / ED COURSE  Pertinent labs & imaging results that were available during my care of the patient were reviewed by me and considered in my medical decision making (see chart for details).  65 y.o. with a history of BPH, recurrent urinary tract infection and retention,  presenting with a can stream, fever, and palpable bladder edge. I'll evaluate the patient for urinary tract infection and also do a postvoid residual to see if he is retaining.  ----------------------------------------- 10:32 AM on 07/06/2015 -----------------------------------------  The patient has been able to urinate, over 3 separate episodes, almost a full liter of urine. My nurse was able to passively catheter with less than 100 cc residual. At this time, it appears that the patient is not having urinary retention. He does have a urinary tract infection however with an elevated white blood cell count of 18. He continues to be afebrile, is overall well-appearing and nontoxic, and has normal creatinine. I'll plan to dose him with IV antibiotics 1 here, then discharge him home with oral antibiotics based on what he can report to me about his previous microbiology. We do not have any of his microbiology here. Here he has a urologist in [place] at [facility] u[facility] who will be able to follow him up. He had instant return precautions as well as discharge instructions.  ____________________________________________  FINAL CLINICAL IMPRESSION(S) / ED DIAGNOSES  Final diagnoses:  None      NEW MEDICATIONS STARTED DURING THIS VISIT:  New Prescriptions   No medications  on file     Eula Listen, MD 07/06/15 [phone]

## 2015-07-06 NOTE — ED Notes (Signed)
Dr norman at bedside. 

## 2015-07-06 NOTE — ED Notes (Signed)
Called lab and added urine culture to urine in lab.

## 2015-07-08 LAB — URINE CULTURE

## 2015-07-09 NOTE — Progress Notes (Signed)
ED Culture Results   Allergies: NKA Visit Date: 07/06/15  Chief Complaint: Suprapubic pressure, urinary retention, status post TURP Culture Type: Urine  Culture Results: 100k CFU Ecoli  Original Abx given: Septra DS BID  Original Abx sensitive, intermediate, or resistant: resistant Recommended [id number] 100 mg PO BID x 10 days  ED Physician: Cephas Darby, MD  Contacted Patient: Yes Prescription Called into: [facility] [address], [place]    Larene Beach, PharmD, BCPS Clinical Pharmacist

## 2016-11-22 DIAGNOSIS — R41844 Frontal lobe and executive function deficit: Secondary | ICD-10-CM | POA: Insufficient documentation

## 2016-12-19 ENCOUNTER — Ambulatory Visit: Payer: Self-pay

## 2016-12-26 ENCOUNTER — Ambulatory Visit (INDEPENDENT_AMBULATORY_CARE_PROVIDER_SITE_OTHER): Payer: Federal, State, Local not specified - PPO | Admitting: Urology

## 2016-12-26 ENCOUNTER — Encounter: Payer: Self-pay | Admitting: Urology

## 2016-12-26 VITALS — BP 137/83 | HR 73 | Ht 64.0 in | Wt 226.0 lb

## 2016-12-26 DIAGNOSIS — N3020 Other chronic cystitis without hematuria: Secondary | ICD-10-CM | POA: Diagnosis not present

## 2016-12-26 DIAGNOSIS — N400 Enlarged prostate without lower urinary tract symptoms: Secondary | ICD-10-CM | POA: Diagnosis not present

## 2016-12-26 LAB — URINALYSIS, COMPLETE
BILIRUBIN UA: NEGATIVE
GLUCOSE, UA: NEGATIVE
Ketones, UA: NEGATIVE
Leukocytes, UA: NEGATIVE
NITRITE UA: NEGATIVE
PROTEIN UA: NEGATIVE
Specific Gravity, UA: 1.02 (ref 1.005–1.030)
UUROB: 0.2 mg/dL (ref 0.2–1.0)
pH, UA: 7 (ref 5.0–7.5)

## 2016-12-26 LAB — MICROSCOPIC EXAMINATION
Bacteria, UA: NONE SEEN
Epithelial Cells (non renal): NONE SEEN /hpf (ref 0–10)
WBC UA: NONE SEEN /HPF (ref 0–?)

## 2016-12-26 LAB — BLADDER SCAN AMB NON-IMAGING

## 2016-12-26 NOTE — Progress Notes (Signed)
12/26/2016 10:12 AM   Vincent Cole 1950-11-05 [id number]  Referring provider: Perrin Maltese, MD [address] [place], [place] [address]  Chief Complaint  Patient presents with  . Benign Prostatic Hypertrophy    New Patient    HPI: The patient was here to be evaluated for lower urinary tract symptoms. He apparently had a TURP in 2005 and 2010 and possibly bladder neck surgery on 2011. It had been mentioned and notes that he may have interstitial cystitis with chronic suprapubic abdominal pain.  The patient is followed by myself in [place]. He described a knee replacement 2060 with bleeding probably from traumatic catheterization. He continues to have daily suprapubic pain voiding every approximately 1 hour. He normally gets up 3 and sometimes 4 times at night. He has a feeling of incomplete emptying. He gets one bladder infection year.  I last saw him in 2015. His surgeries have been done in [place]. He did have a positive hydrodistention in January 2013. The [place] ache for his knee was helping and he was still on [place]. I felt his in frequent bladder infections may be false positive. I did not feel I could offer him anything further. I had had him stop his Flomax and finasteride. I thought rescue treatments would be difficult with his bladder neck issues with a coud catheter. I educated him about Prelief  He had a 40-50 g benign prostate today  Modifying factors: There are no other modifying factors  Associated signs and symptoms: There are no other associated signs and symptoms Aggravating and relieving factors: There are no other aggravating or relieving factors Severity: Moderate Duration: Persistent   PMH: Past Medical History:  Diagnosis Date  . Benign bladder mass   . Frequency of urination   . GERD (gastroesophageal reflux disease) no meds  . History of recurrent UTIs   . Hypertension   . IC (interstitial cystitis)   . Nocturia   . OSA on CPAP nightly    . Pelvic pain in male   . Simple renal cyst   . Urgency of urination   . Urinary hesitancy     Surgical History: Past Surgical History:  Procedure Laterality Date  . CYSTO WITH HYDRODISTENSION  06/02/2011   Procedure: CYSTOSCOPY/HYDRODISTENSION;  Surgeon: Reece Packer, MD;  Location: [facility];  Service: Urology;  Laterality: N/A;  catheter placement  . 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 KNEE ARTHROSCOPY    . TRANSURETHRAL RESECTION OF PROSTATE  X3   LAST ONE MAY 2012    Home Medications:  Allergies as of 12/26/2016   No Known Allergies     Medication List       Accurate as of 12/26/16 10:12 AM. Always use your most recent med list.          acetaminophen 500 MG tablet Commonly known as:  TYLENOL Take 500 mg by mouth every 6 (six) hours as needed.   amLODipine-benazepril 5-20 MG capsule Commonly known as:  LOTREL Take 1 capsule by mouth daily.   multivitamin tablet Take 1 tablet by mouth daily.   tamsulosin 0.4 MG Caps capsule Commonly known as:  FLOMAX Take 0.4 mg by mouth daily.   traZODone 50 MG tablet Commonly known as:  DESYREL TAKE 1 TABLET BY MOUTH EVERY DAY AT NIGHT   zolpidem 5 MG tablet Commonly known as:  AMBIEN Take 5 mg by mouth at bedtime as needed for sleep.       Allergies:  No Known Allergies  Family History: No family history on file.  Social History:  reports that he has never smoked. He has never used smokeless tobacco. He reports that he drinks alcohol. He reports that he does not use drugs.  ROS:                                        Physical Exam: BP 137/83   Pulse 73   Ht 5\' 4"  (1.626 m)   Wt 102.5 kg (226 lb)   BMI 38.79 kg/m   Constitutional:  Alert and oriented, No acute distress.  Laboratory Data: Lab Results  Component Value Date   WBC 18.0 (H) 07/06/2015   HGB 13.6 07/06/2015   HCT 40.5 07/06/2015   MCV 87.2 07/06/2015   PLT 278 07/06/2015    Lab  Results  Component Value Date   CREATININE 0.95 07/06/2015    No results found for: PSA  No results found for: TESTOSTERONE  No results found for: HGBA1C  Urinalysis    Component Value Date/Time   COLORURINE STRAW (A) 07/06/2015 0838   APPEARANCEUR CLEAR (A) 07/06/2015 0838   LABSPEC 1.003 (L) 07/06/2015 0838   PHURINE 8.0 07/06/2015 0838   GLUCOSEU NEGATIVE 07/06/2015 0838   HGBUR 2+ (A) 07/06/2015 0838   BILIRUBINUR NEGATIVE 07/06/2015 0838   KETONESUR NEGATIVE 07/06/2015 0838   PROTEINUR NEGATIVE 07/06/2015 0838   NITRITE NEGATIVE 07/06/2015 0838   LEUKOCYTESUR 3+ (A) 07/06/2015 0838    Pertinent Imaging: None  Assessment & Plan:  The patient has a clinical diagnosis of interstitial cystitis. He does have frequency which one could treat with overactive bladder medications. I don't think he has a lot of good options for his chronic pain. He has had urodynamics. He still takes the Mobic only. He might follow up with another opinion with Dr. Amalia Hailey. I will see him when necessary. I did not have another good option for him.  1. Benign prostatic hyperplasia, unspecified whether lower urinary tract symptoms present 2. Chronic cystitis - Urinalysis, Complete - BLADDER SCAN AMB NON-IMAGING   Return if symptoms worsen or fail to improve.  Reece Packer, MD  [facility] [address], [place], [place] [address] [phone]

## 2017-04-19 DIAGNOSIS — M75122 Complete rotator cuff tear or rupture of left shoulder, not specified as traumatic: Secondary | ICD-10-CM | POA: Insufficient documentation

## 2017-04-19 HISTORY — DX: Complete rotator cuff tear or rupture of left shoulder, not specified as traumatic: M75.122

## 2017-04-23 ENCOUNTER — Emergency Department
Admission: EM | Admit: 2017-04-23 | Discharge: 2017-04-23 | Disposition: A | Discharge status: Home / Self Care | Payer: Federal, State, Local not specified - PPO | Attending: Emergency Medicine | Admitting: Emergency Medicine

## 2017-04-23 DIAGNOSIS — Z79899 Other long term (current) drug therapy: Secondary | ICD-10-CM | POA: Insufficient documentation

## 2017-04-23 DIAGNOSIS — K047 Periapical abscess without sinus: Secondary | ICD-10-CM | POA: Diagnosis not present

## 2017-04-23 DIAGNOSIS — I10 Essential (primary) hypertension: Secondary | ICD-10-CM | POA: Insufficient documentation

## 2017-04-23 DIAGNOSIS — R350 Frequency of micturition: Secondary | ICD-10-CM | POA: Diagnosis present

## 2017-04-23 DIAGNOSIS — R300 Dysuria: Secondary | ICD-10-CM

## 2017-04-23 LAB — BASIC METABOLIC PANEL
Anion gap: 8 (ref 5–15)
BUN: 25 mg/dL — AB (ref 6–20)
CO2: 24 mmol/L (ref 22–32)
CREATININE: 1.02 mg/dL (ref 0.61–1.24)
Calcium: 8.9 mg/dL (ref 8.9–10.3)
Chloride: 104 mmol/L (ref 101–111)
GFR calc Af Amer: >60 mL/min (ref >60)
GLUCOSE: 92 mg/dL (ref 65–99)
POTASSIUM: 3.8 mmol/L (ref 3.5–5.1)
SODIUM: 136 mmol/L (ref 135–145)

## 2017-04-23 LAB — CBC WITH DIFFERENTIAL/PLATELET
BASOS ABS: 0 10*3/uL (ref 0–0.1)
Basophils Relative: 0 %
Eosinophils Absolute: 0.1 10*3/uL (ref 0–0.7)
Eosinophils Relative: 1 %
HEMATOCRIT: 39.5 % — AB (ref 40.0–52.0)
Hemoglobin: 13.3 g/dL (ref 13.0–18.0)
LYMPHS ABS: 1.2 10*3/uL (ref 1.0–3.6)
LYMPHS PCT: 13 %
MCH: 29.6 pg (ref 26.0–34.0)
MCHC: 33.6 g/dL (ref 32.0–36.0)
MCV: 88.1 fL (ref 80.0–100.0)
MONO ABS: 1 10*3/uL (ref 0.2–1.0)
MONOS PCT: 11 %
NEUTROS ABS: 6.7 10*3/uL — AB (ref 1.4–6.5)
Neutrophils Relative %: 75 %
Platelets: 292 10*3/uL (ref 150–440)
RBC: 4.48 MIL/uL (ref 4.40–5.90)
RDW: 14.2 % (ref 11.5–14.5)
WBC: 9 10*3/uL (ref 3.8–10.6)

## 2017-04-23 LAB — URINALYSIS, COMPLETE (UACMP) WITH MICROSCOPIC
BACTERIA UA: NONE SEEN
BILIRUBIN URINE: NEGATIVE
Glucose, UA: NEGATIVE mg/dL
KETONES UR: NEGATIVE mg/dL
Leukocytes, UA: NEGATIVE
NITRITE: NEGATIVE
PROTEIN: NEGATIVE mg/dL
Specific Gravity, Urine: 1.019 (ref 1.005–1.030)
pH: 5 (ref 5.0–8.0)

## 2017-04-23 MED ORDER — SULFAMETHOXAZOLE-TRIMETHOPRIM 800-160 MG PO TABS: 1.0000 | ORAL_TABLET | Freq: Two times a day (BID) | ORAL | 0 refills | Status: DC

## 2017-04-23 NOTE — ED Provider Notes (Signed)
[facility] Emergency Department Provider Note ____________________________________________   None    11:05 AM I have reviewed the triage vital signs and the nursing notes.   HISTORY  Chief Complaint Urinary Frequency   HPI Vincent Cole is a 66 y.o. male who presents to the emergency department for evaluation of dysuria and dental pain. He recently finished Cipro for UTI, but continues to have dysuria. Dental pain started 3 days ago and has had some drainage from the gumline above the tooth. He reports feeling chills and having a fever.  Past Medical History:  Diagnosis Date  . Benign bladder mass   . Frequency of urination   . GERD (gastroesophageal reflux disease) no meds  . History of recurrent UTIs   . Hypertension   . IC (interstitial cystitis)   . Nocturia   . OSA on CPAP nightly  . Pelvic pain in male   . Simple renal cyst   . Urgency of urination   . Urinary hesitancy     Patient Active Problem List   Diagnosis Date Noted  . 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 Hypertension 05/21/2015  . Arthritis of left knee 05/21/2015    Past Surgical History:  Procedure Laterality Date  . CYSTO WITH HYDRODISTENSION  06/02/2011   Procedure: CYSTOSCOPY/HYDRODISTENSION;  Surgeon: Reece Packer, MD;  [facility]  Service: Urology;  Laterality: N/A;  catheter placement  . 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 KNEE ARTHROSCOPY    . TRANSURETHRAL RESECTION OF PROSTATE  X3   LAST ONE MAY 2012    Prior to Admission medications   Medication Sig Start Date End Date Taking? Authorizing Provider  acetaminophen (TYLENOL) 500 MG tablet Take 500 mg by mouth every 6 (six) hours as needed.      [provider]  amLODipine-benazepril (LOTREL) 5-20 MG per capsule Take 1 capsule by mouth daily.    [provider]  Multiple Vitamin (MULTIVITAMIN) tablet Take 1 tablet by mouth daily.      [provider]  Tamsulosin HCl (FLOMAX) 0.4 MG CAPS Take 0.4 mg by mouth daily.    [provider]  traZODone (DESYREL) 50 MG tablet TAKE 1 TABLET BY MOUTH EVERY DAY AT NIGHT 12/19/16   [provider]  zolpidem (AMBIEN) 5 MG tablet Take 5 mg by mouth at bedtime as needed for sleep.    [provider]    Allergies Patient has no known allergies.  History reviewed. No pertinent family history.  Social History Social History   Tobacco Use  . Smoking status: Never Smoker  . Smokeless tobacco: Never Used  Substance Use Topics  . Alcohol use: Yes    Comment: OCCASIONAL  . Drug use: No    Review of Systems  Constitutional: Positive for fever/chills Eyes: No visual changes. ENT: No sore throat. Positive for left upper side dental pain. Cardiovascular: Denies chest pain. Respiratory: Denies shortness of breath. Gastrointestinal: No abdominal pain.  No nausea, no vomiting.  No diarrhea.  No constipation. Genitourinary: Positive for dysuria. Musculoskeletal: Negative for back pain. Skin: Negative for rash. Neurological: Negative for headaches, focal weakness or numbness.  ____________________________________________   PHYSICAL EXAM:  VITAL SIGNS: ED Triage Vitals  Enc Vitals Group     BP 04/23/17 1022 123/71     Pulse Rate 04/23/17 1022 93     Resp 04/23/17 1022 18     Temp 04/23/17 1022 100.1 F (37.8 C)  Temp Source 04/23/17 1022 Oral     SpO2 04/23/17 1022 100 %     Weight 04/23/17 1024 212 lb (96.2 kg)     Height 04/23/17 1024 5\' 4"  (1.626 m)     Head Circumference --      Peak Flow --      Pain Score 04/23/17 1042 0     Pain Loc --      Pain Edu? --      Excl. in [place]? --     Constitutional: Alert and oriented. Well appearing and in no acute distress. Eyes: Conjunctivae are normal. PERRL. EOMI. Head: Atraumatic. Nose: No congestion/rhinnorhea. Mouth/Throat: Mucous membranes are moist.  Oropharynx non-erythematous. Tooth #15 has a  crown in place with associated gingival erythema and mild edema. No fluctuant area identified. No active drainage observed.  Neck: No stridor.   Cardiovascular: Normal rate, regular rhythm. Grossly normal heart sounds.  Good peripheral circulation. Respiratory: Normal respiratory effort.  No retractions. Gastrointestinal: Soft and nontender. No distention. No abdominal bruits. No CVA tenderness Musculoskeletal: No lower extremity tenderness nor edema.  No joint effusions. Neurologic:  Normal speech and language. No gross focal neurologic deficits are appreciated. No gait instability. Skin:  Skin is warm, dry and intact. No rash noted. Psychiatric: Mood and affect are normal. Speech and behavior are normal.  ____________________________________________   LABS (all labs ordered are listed, but only abnormal results are displayed)  Labs Reviewed - No data to display ____________________________________________  EKG  Not indicated. ____________________________________________  RADIOLOGY  No results found.  ____________________________________________   PROCEDURES  Procedure(s) performed: None  Procedures  Critical Care performed: No  ____________________________________________   INITIAL IMPRESSION / ASSESSMENT AND PLAN / ED COURSE    66 year old male presenting to the emergency department for treatment and evaluation of dysuria dental pain.  Patient states that he has an appointment scheduled with the urologist tomorrow, but thought he would mention the dysuria since he was here for his dental pain anyway.  He states that he finished a course of ciprofloxacin a few days ago and the dysuria returned.  He has not taken any medications since symptoms return.  For his dental pain, he has not been taking any medications.  He states that he has tasted and has "squeezed" some infection from around the tooth.  He will be treated with Bactrim and encouraged to see a dentist within 2  weeks.  He was also encouraged to see the urologist as scheduled tomorrow.  A culture has been requested of the urinalysis that was sent today.  Patient was advised to follow-up with his primary care provider for symptoms of change or worsen if he is unable to see the specialist.  He was advised to return to the emergency department if unable to schedule an appointment.      ____________________________________________   FINAL CLINICAL IMPRESSION(S) / ED DIAGNOSES  Final diagnoses:  None     ED Discharge Orders    None       Note:  This document was prepared using Dragon voice recognition software and may include unintentional dictation errors.    Victorino Dike, FNP 04/23/17 1521    Delman Kitten, MD 04/23/17 [phone]

## 2017-04-23 NOTE — ED Triage Notes (Signed)
Pt presents via POV c/o urinary symptoms. Reports taking Cipro for UTI with unresolved symptoms. Also reports left sided dental pain.

## 2017-04-23 NOTE — ED Notes (Signed)
First Nurse Note: Pt ambulatory into ED c/o possibly UTI. Pt in NAD at this time.

## 2017-04-24 LAB — URINE CULTURE: Culture: <10000 — AB

## 2017-07-31 DIAGNOSIS — N320 Bladder-neck obstruction: Secondary | ICD-10-CM | POA: Insufficient documentation

## 2017-08-02 DIAGNOSIS — G4733 Obstructive sleep apnea (adult) (pediatric): Secondary | ICD-10-CM | POA: Insufficient documentation

## 2017-08-02 DIAGNOSIS — M1990 Unspecified osteoarthritis, unspecified site: Secondary | ICD-10-CM | POA: Insufficient documentation

## 2017-08-02 DIAGNOSIS — G4700 Insomnia, unspecified: Secondary | ICD-10-CM | POA: Insufficient documentation

## 2017-08-03 DIAGNOSIS — M75101 Unspecified rotator cuff tear or rupture of right shoulder, not specified as traumatic: Secondary | ICD-10-CM | POA: Insufficient documentation

## 2017-08-19 ENCOUNTER — Encounter: Payer: Self-pay | Admitting: *Deleted

## 2017-08-19 ENCOUNTER — Emergency Department
Admission: EM | Admit: 2017-08-19 | Discharge: 2017-08-19 | Disposition: A | Discharge status: Home / Self Care | Payer: Medicare Other | Attending: Emergency Medicine | Admitting: Emergency Medicine

## 2017-08-19 ENCOUNTER — Other Ambulatory Visit: Payer: Self-pay

## 2017-08-19 DIAGNOSIS — R339 Retention of urine, unspecified: Secondary | ICD-10-CM | POA: Diagnosis not present

## 2017-08-19 DIAGNOSIS — Z5321 Procedure and treatment not carried out due to patient leaving prior to being seen by health care provider: Secondary | ICD-10-CM | POA: Diagnosis not present

## 2017-08-19 LAB — URINALYSIS, COMPLETE (UACMP) WITH MICROSCOPIC
BACTERIA UA: NONE SEEN
BILIRUBIN URINE: NEGATIVE
Glucose, UA: NEGATIVE mg/dL
Ketones, ur: NEGATIVE mg/dL
NITRITE: NEGATIVE
Protein, ur: 30 mg/dL — AB
SPECIFIC GRAVITY, URINE: 1.021 (ref 1.005–1.030)
pH: 6 (ref 5.0–8.0)

## 2017-08-19 NOTE — ED Notes (Signed)
Bladder scan in triage showed 255ml

## 2017-08-19 NOTE — ED Triage Notes (Signed)
Patient states he had a TURP on 3/22 and Foley catheter was removed on 3/25. Patient state he voided without difficulty initially, but now voids only small amounts and has a feeling of fullness. Patient contacted his surgeon who advised him to come here to the ED.

## 2017-09-05 ENCOUNTER — Encounter (INDEPENDENT_AMBULATORY_CARE_PROVIDER_SITE_OTHER): Payer: Self-pay | Admitting: Vascular Surgery

## 2017-09-05 ENCOUNTER — Ambulatory Visit (INDEPENDENT_AMBULATORY_CARE_PROVIDER_SITE_OTHER): Payer: Medicare Other | Admitting: Vascular Surgery

## 2017-09-05 VITALS — BP 109/72 | HR 68 | Resp 17 | Ht 64.5 in | Wt 208.0 lb

## 2017-09-05 DIAGNOSIS — I2699 Other pulmonary embolism without acute cor pulmonale: Secondary | ICD-10-CM

## 2017-09-05 DIAGNOSIS — I739 Peripheral vascular disease, unspecified: Secondary | ICD-10-CM | POA: Diagnosis not present

## 2017-09-05 NOTE — Progress Notes (Signed)
Subjective:    Patient ID: Vincent Cole, male    DOB: July 11, 1950, 67 y.o.   MRN: [id number] Chief Complaint  Patient presents with  . New Patient (Initial Visit)    ref Humphrey Rolls for leg pain   Presents as a new patient referred by Dr. Humphrey Rolls for evaluation of bilateral lower extremity pain.  The patient notes primarily right lower extremity discomfort since March 2019.  The patient describes his discomfort as a "numbness" which starts at the hip and radiates to the toes.  The patient notes calf pain at night.  The patient has similar symptoms to the left lower extremity however there are not as uncomfortable when compared to the right.  The patient was recently diagnosed with a small right lower lobe pulmonary embolism after a CTA was completed for an abnormal ABI.  Venous Dopplers were negative for DVT.  The patient was started on Xarelto.  The patient denies any chest pain or shortness of breath. The patient does note a recent left rotator cuff repair surgery as well as a long car/plane ride in January as well.  Patient denies any ulceration to the bilateral lower extremity.  Patient denies any fever, nausea vomiting.  Review of Systems  Constitutional: Negative.   HENT: Negative.   Eyes: Negative.   Respiratory: Negative.   Cardiovascular:       Bilateral lower extremity claudication Bilateral lower extremity numbness Pulmonary embolism  Gastrointestinal: Negative.   Endocrine: Negative.   Genitourinary: Negative.   Musculoskeletal: Negative.   Skin: Negative.   Allergic/Immunologic: Negative.   Neurological: Negative.   Hematological: Negative.   Psychiatric/Behavioral: Negative.       Objective:   Physical Exam  Constitutional: He is oriented to person, place, and time. He appears well-developed and well-nourished. No distress.  HENT:  Head: Normocephalic and atraumatic.  Right Ear: External ear normal.  Left Ear: External ear normal.  Eyes: Pupils are equal, round, and  reactive to light. Conjunctivae and EOM are normal.  Neck: Normal range of motion.  Cardiovascular: Normal rate, regular rhythm, normal heart sounds and intact distal pulses.  Pulses:      Radial pulses are 2+ on the right side, and 2+ on the left side.       Dorsalis pedis pulses are 1+ on the right side, and 1+ on the left side.       Posterior tibial pulses are 2+ on the right side, and 1+ on the left side.  Pulmonary/Chest: Effort normal and breath sounds normal. No stridor. No respiratory distress. He has no wheezes.  Musculoskeletal: Normal range of motion. He exhibits no edema.  Neurological: He is alert and oriented to person, place, and time.  Skin: Skin is warm and dry. He is not diaphoretic.  Psychiatric: He has a normal mood and affect. His behavior is normal. Judgment and thought content normal.  Vitals reviewed.  BP 109/72 (BP Location: Left Arm)   Pulse 68   Resp 17   Ht 5' 4.5" (1.638 m)   Wt 208 lb (94.3 kg)   BMI 35.15 kg/m   Past Medical History:  Diagnosis Date  . Benign bladder mass   . Frequency of urination   . GERD (gastroesophageal reflux disease) no meds  . History of recurrent UTIs   . Hypertension   . IC (interstitial cystitis)   . Nocturia   . OSA on CPAP nightly  . Pelvic pain in male   . Simple renal cyst   .  Urgency of urination   . Urinary hesitancy    Social History   Socioeconomic History  . Marital status: Married    Spouse name: Not on file  . Number of children: Not on file  . Years of education: Not on file  . Highest education level: Not on file  Occupational History  . Not on file  Social Needs  . Financial resource strain: Not on file  . Food insecurity:    Worry: Not on file    Inability: Not on file  . Transportation needs:    Medical: Not on file    Non-medical: Not on file  Tobacco Use  . Smoking status: Never Smoker  . Smokeless tobacco: Never Used  Substance and Sexual Activity  . Alcohol use: Not Currently  .  Drug use: No  . Sexual activity: Not on file  Lifestyle  . Physical activity:    Days per week: Not on file    Minutes per session: Not on file  . Stress: Not on file  Relationships  . Social connections:    Talks on phone: Not on file    Gets together: Not on file    Attends religious service: Not on file    Active member of club or organization: Not on file    Attends meetings of clubs or organizations: Not on file    Relationship status: Not on file  . Intimate partner violence:    Fear of current or ex partner: Not on file    Emotionally abused: Not on file    Physically abused: Not on file    Forced sexual activity: Not on file  Other Topics Concern  . Not on file  Social History Narrative  . Not on file   Past Surgical History:  Procedure Laterality Date  . CYSTO WITH HYDRODISTENSION  06/02/2011   Procedure: CYSTOSCOPY/HYDRODISTENSION;  Surgeon: Reece Packer, MD;  Location: [facility];  Service: Urology;  Laterality: N/A;  catheter placement  . 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 KNEE ARTHROSCOPY    . TRANSURETHRAL RESECTION OF PROSTATE  X3   LAST ONE MAY 2012   Family History  Problem Relation Age of Onset  . Heart disease Mother    No Known Allergies     Assessment & Plan:  Presents as a new patient referred by Dr. Humphrey Rolls for evaluation of bilateral lower extremity pain.  The patient notes primarily right lower extremity discomfort since March 2019.  The patient describes his discomfort as a "numbness" which starts at the hip and radiates to the toes.  The patient notes calf pain at night.  The patient has similar symptoms to the left lower extremity however there are not as uncomfortable when compared to the right.  The patient was recently diagnosed with a small right lower lobe pulmonary embolism after a CTA was completed for an abnormal ABI.  Venous Dopplers were negative for DVT.  The patient was started on Xarelto.  The patient  denies any chest pain or shortness of breath. The patient does note a recent left rotator cuff repair surgery as well as a long car/plane ride in January as well.  Patient denies any ulceration to the bilateral lower extremity.  Patient denies any fever, nausea vomiting.  1. PAD (peripheral artery disease) ([place]) - New Patient with abnormal ABI conducted at Dr. Laurelyn Sickle office Patient is experiencing right > left lower extremity numbness and pain since March Palpable pedal pulses  on exam however I will bring the patient back and have him undergo an aortoiliac and bilateral lower extremity arterial duplex as the patient has discomfort from his hip distally to the bilateral lower extremity I have discussed with the patient at length the risk factors for and pathogenesis of atherosclerotic disease and encouraged a healthy diet, regular exercise regimen and blood pressure / glucose control.  The patient was encouraged to call the office in the interim if he experiences any claudication like symptoms, rest pain or ulcers to his feet / toes.  - VAS [place] LOWER EXTREMITY ARTERIAL DUPLEX; Future - VAS US AORTA/IVC/ILIACS; Future  2. Other acute pulmonary embolism without acute cor pulmonale ([place]) - New Patient was found to have a small right lower lobe pulmonary embolism Patient was started on Xarelto by his primary care Pulmonary embolisms usually require anticoagulation for at least a year Patient was encouraged to follow-up with his primary care physician in regard to continued observation for this The patient does not have any lower extremity DVT The patient does not have any issues with anticoagulation so far  Current Outpatient Medications on File Prior to Visit  Medication Sig Dispense Refill  . amLODipine-benazepril (LOTREL) 5-20 MG per capsule Take 1 capsule by mouth daily.    Marland Kitchen gabapentin (NEURONTIN) 100 MG capsule Take by mouth daily.  6  . hydrOXYzine (ATARAX/VISTARIL) 25 MG tablet hydroxyzine  HCl 25 mg tablet  TAKE 1-2 TABS BY MOUTH 1 HOUR BEFORE SLEEP    . oxybutynin (DITROPAN-XL) 5 MG 24 hr tablet TAKE 2 TABS BY MOUTH DAILY AS NEEDED FOR BLADDER SPASMS/PELVIC PAIN  0  . Rivaroxaban (XARELTO) 15 MG TABS tablet Take 15 mg by mouth daily.    . traZODone (DESYREL) 50 MG tablet TAKE 1 TABLET BY MOUTH EVERY DAY AT NIGHT  2  . acetaminophen (TYLENOL) 500 MG tablet Take 500 mg by mouth every 6 (six) hours as needed.      . Multiple Vitamin (MULTIVITAMIN) tablet Take 1 tablet by mouth daily.      Marland Kitchen sulfamethoxazole-trimethoprim (BACTRIM DS,SEPTRA DS) 800-160 MG tablet Take 1 tablet by mouth 2 (two) times daily. (Patient not taking: Reported on 09/05/2017) 20 tablet 0  . Tamsulosin HCl (FLOMAX) 0.4 MG CAPS Take 0.4 mg by mouth daily.    Marland Kitchen zolpidem (AMBIEN) 5 MG tablet Take 5 mg by mouth at bedtime as needed for sleep.     No current facility-administered medications on file prior to visit.    There are no Patient Instructions on file for this visit. No follow-ups on file.  Ieisha Gao A Marquize Seib, PA-C

## 2017-10-20 ENCOUNTER — Other Ambulatory Visit (INDEPENDENT_AMBULATORY_CARE_PROVIDER_SITE_OTHER): Payer: Self-pay | Admitting: Vascular Surgery

## 2017-10-20 ENCOUNTER — Encounter (INDEPENDENT_AMBULATORY_CARE_PROVIDER_SITE_OTHER): Payer: Self-pay

## 2017-10-20 ENCOUNTER — Encounter (INDEPENDENT_AMBULATORY_CARE_PROVIDER_SITE_OTHER): Payer: Self-pay | Admitting: Vascular Surgery

## 2017-10-20 ENCOUNTER — Encounter (INDEPENDENT_AMBULATORY_CARE_PROVIDER_SITE_OTHER): Payer: Medicare Other

## 2017-10-20 ENCOUNTER — Encounter

## 2017-10-20 ENCOUNTER — Other Ambulatory Visit (INDEPENDENT_AMBULATORY_CARE_PROVIDER_SITE_OTHER): Payer: Medicare Other

## 2017-10-20 ENCOUNTER — Ambulatory Visit (INDEPENDENT_AMBULATORY_CARE_PROVIDER_SITE_OTHER): Payer: Medicare Other | Admitting: Vascular Surgery

## 2017-10-20 VITALS — BP 137/92 | HR 70 | Resp 15 | Ht 64.5 in | Wt 213.0 lb

## 2017-10-20 DIAGNOSIS — I2699 Other pulmonary embolism without acute cor pulmonale: Secondary | ICD-10-CM | POA: Diagnosis not present

## 2017-10-20 DIAGNOSIS — I739 Peripheral vascular disease, unspecified: Secondary | ICD-10-CM

## 2017-10-20 DIAGNOSIS — M79609 Pain in unspecified limb: Secondary | ICD-10-CM | POA: Insufficient documentation

## 2017-10-20 DIAGNOSIS — M79605 Pain in left leg: Secondary | ICD-10-CM | POA: Diagnosis not present

## 2017-10-20 DIAGNOSIS — I10 Essential (primary) hypertension: Secondary | ICD-10-CM

## 2017-10-20 DIAGNOSIS — M79604 Pain in right leg: Secondary | ICD-10-CM

## 2017-10-20 NOTE — Assessment & Plan Note (Signed)
His ABIs today were normal at 1.3 bilaterally with brisk triphasic waveforms and normal digital pressures and waveforms bilaterally consistent with no hemodynamically significant stenosis in either lower extremity.  His numbness in the right leg he has improved with gabapentin. It does not appears if his lower extremity symptoms are secondary to vascular disease.  Neuropathic pain seems to be present.  He can return to see [place] on an as-needed basis.

## 2017-10-20 NOTE — Assessment & Plan Note (Signed)
Small.  6 months of anticoagulation would be appropriate.

## 2017-10-20 NOTE — Progress Notes (Signed)
MRN : [id number]  Vincent Cole is a 67 y.o. (07/26/1950) male who presents with chief complaint of  Chief Complaint  Patient presents with  . Follow-up    ABI follow up  .  History of Present Illness: Patient returns today in follow up of leg numbness with arterial studies today.  His ABIs today were normal at 1.3 bilaterally with brisk triphasic waveforms and normal digital pressures and waveforms bilaterally consistent with no hemodynamically significant stenosis in either lower extremity.  His numbness in the right leg he has improved with gabapentin. He also remains on anticoagulation.  About 2 months ago, he was diagnosed with a very small pulmonary embolus.  He has tolerated anticoagulation without bleeding.  Current Outpatient Medications  Medication Sig Dispense Refill  . acetaminophen (TYLENOL) 500 MG tablet Take 500 mg by mouth every 6 (six) hours as needed.      Marland Kitchen amLODipine-benazepril (LOTREL) 5-20 MG per capsule Take 1 capsule by mouth daily.    . Cholecalciferol (VITAMIN D3) 1000 units CAPS TAKE ONE CAPSULE BY MOUTH DAILY    . docusate sodium (COLACE) 100 MG capsule Take 100 mg by mouth 2 (two) times daily.  0  . gabapentin (NEURONTIN) 100 MG capsule Take by mouth daily.  6  . hydrocortisone cream 1 % Apply topically.    . hydrOXYzine (ATARAX/VISTARIL) 25 MG tablet hydroxyzine HCl 25 mg tablet  TAKE 1-2 TABS BY MOUTH 1 HOUR BEFORE SLEEP    . meloxicam (MOBIC) 15 MG tablet     . Multiple Vitamin (MULTIVITAMIN) tablet Take 1 tablet by mouth daily.      Marland Kitchen oxybutynin (DITROPAN-XL) 5 MG 24 hr tablet TAKE 2 TABS BY MOUTH DAILY AS NEEDED FOR BLADDER SPASMS/PELVIC PAIN  0  . pentosan polysulfate (ELMIRON) 100 MG capsule [place]iron 100 mg capsule  TAKE 1 CAPSULE (100 MG TOTAL) BY MOUTH 3 TIMES DAILY BEFORE MEALS    . Polyethylene Glycol 3350 (PEG 3350) POWD Take 17 g by mouth.    . Rivaroxaban (XARELTO) 15 MG TABS tablet Take 15 mg by mouth daily.    Marland Kitchen  sulfamethoxazole-trimethoprim (BACTRIM DS,SEPTRA DS) 800-160 MG tablet Take 1 tablet by mouth 2 (two) times daily. 20 tablet 0  . Tamsulosin HCl (FLOMAX) 0.4 MG CAPS Take 0.4 mg by mouth daily.    . traMADol (ULTRAM) 50 MG tablet tramadol 50 mg tablet  TAKE 1 TABLET(S) EVERY 6 HOURS BY ORAL ROUTE AS NEEDED.      Marland Kitchen traZODone (DESYREL) 50 MG tablet TAKE 1 TABLET BY MOUTH EVERY DAY AT NIGHT  2  . zolpidem (AMBIEN) 5 MG tablet Take 5 mg by mouth at bedtime as needed for sleep.     No current facility-administered medications for this visit.     Past Medical History:  Diagnosis Date  . Benign bladder mass   . Frequency of urination   . GERD (gastroesophageal reflux disease) no meds  . History of recurrent UTIs   . Hypertension   . IC (interstitial cystitis)   . Nocturia   . OSA on CPAP nightly  . Pelvic pain in male   . Simple renal cyst   . Urgency of urination   . Urinary hesitancy     Past Surgical History:  Procedure Laterality Date  . CYSTO WITH HYDRODISTENSION  06/02/2011   Procedure: CYSTOSCOPY/HYDRODISTENSION;  Surgeon: Reece Packer, MD;  Location: [facility];  Service: Urology;  Laterality: N/A;  catheter placement  . INCISION OF  BLADDER NECK CONTRACTURE  11/2009  . KNEE ARTHROSCOPY    . TRANSURETHRAL RESECTION OF PROSTATE  X3   LAST ONE MAY 2012    Social History Social History   Tobacco Use  . Smoking status: Never Smoker  . Smokeless tobacco: Never Used  Substance Use Topics  . Alcohol use: Not Currently  . Drug use: No      Family History Family History  Problem Relation Age of Onset  . Heart disease Mother      No Known Allergies   REVIEW OF SYSTEMS (Negative unless checked)  Constitutional: [] Weight loss  [] Fever  [] Chills Cardiac: [] Chest pain   [] Chest pressure   [] Palpitations   [] Shortness of breath when laying flat   [] Shortness of breath at rest   [] Shortness of breath with exertion. Vascular:  [] Pain in legs with  walking   [] Pain in legs at rest   [] Pain in legs when laying flat   [] Claudication   [] Pain in feet when walking  [] Pain in feet at rest  [] Pain in feet when laying flat   [x] History of DVT   [] Phlebitis   [x] Swelling in legs   [] Varicose veins   [] Non-healing ulcers Pulmonary:   [] Uses home oxygen   [] Productive cough   [] Hemoptysis   [] Wheeze  [] COPD   [] Asthma Neurologic:  [] Dizziness  [] Blackouts   [] Seizures   [] History of stroke   [] History of TIA  [] Aphasia   [] Temporary blindness   [] Dysphagia   [] Weakness or numbness in arms   [x] Weakness or numbness in legs Musculoskeletal:  [x] Arthritis   [] Joint swelling   [] Joint pain   [] Low back pain Hematologic:  [] Easy bruising  [] Easy bleeding   [] Hypercoagulable state   [] Anemic   Gastrointestinal:  [] Blood in stool   [] Vomiting blood  [] Gastroesophageal reflux/heartburn   [] Abdominal pain Genitourinary:  [] Chronic kidney disease   [] Difficult urination  [] Frequent urination  [] Burning with urination   [] Hematuria Skin:  [] Rashes   [] Ulcers   [] Wounds Psychological:  [] History of anxiety   []  History of major depression.  Physical Examination  BP (!) 137/92 (BP Location: Right Arm, Patient Position: Sitting)   Pulse 70   Resp 15   Ht 5' 4.5" (1.638 m)   Wt 213 lb (96.6 kg)   BMI 36.00 kg/m  Gen:  WD/WN, NAD Head: [place]/AT, No temporalis wasting. Ear/Nose/Throat: Hearing grossly intact, nares w/o erythema or drainage Eyes: Conjunctiva clear. Sclera non-icteric Neck: Supple.  Trachea midline Pulmonary:  Good air movement, no use of accessory muscles.  Cardiac: RRR, no JVD Vascular:  Vessel Right Left  Radial Palpable Palpable                          PT Palpable Palpable  DP Palpable Palpable    Musculoskeletal: M/S 5/5 throughout.  No deformity or atrophy. no edema. Neurologic: Sensation grossly intact in extremities.  Symmetrical.  Speech is fluent.  Psychiatric: Judgment intact, Mood & affect appropriate for pt's clinical  situation. Dermatologic: No rashes or ulcers noted.  No cellulitis or open wounds.       Labs Recent Results (from the past 2160 hour(s))  Urinalysis, Complete w Microscopic     Status: Abnormal   Collection Time: 08/19/17  2:38 PM  Result Value Ref Range   Color, Urine YELLOW (A) YELLOW   APPearance HAZY (A) CLEAR   Specific Gravity, Urine 1.021 1.005 - 1.030   pH 6.0 5.0 - 8.0  Glucose, UA NEGATIVE NEGATIVE mg/dL   Hgb urine dipstick SMALL (A) NEGATIVE   Bilirubin Urine NEGATIVE NEGATIVE   Ketones, ur NEGATIVE NEGATIVE mg/dL   Protein, ur 30 (A) NEGATIVE mg/dL   Nitrite NEGATIVE NEGATIVE   Leukocytes, UA SMALL (A) NEGATIVE   RBC / HPF TOO NUMEROUS TO COUNT 0 - 5 RBC/hpf   WBC, UA 6-30 0 - 5 WBC/hpf   Bacteria, UA NONE SEEN NONE SEEN   Squamous Epithelial / LPF 0-5 (A) NONE SEEN   Mucus PRESENT     Comment: Performed at [facility], [address], [place], [place] [address]    Radiology No results found.  Assessment/Plan  PE (pulmonary thromboembolism) (HCC) Small.  6 months of anticoagulation would be appropriate.  Pain in limb His ABIs today were normal at 1.3 bilaterally with brisk triphasic waveforms and normal digital pressures and waveforms bilaterally consistent with no hemodynamically significant stenosis in either lower extremity.  His numbness in the right leg he has improved with gabapentin. It does not appears if his lower extremity symptoms are secondary to vascular disease.  Neuropathic pain seems to be present.  He can return to see [place] on an as-needed basis.    Leotis Pain, MD  10/20/2017 10:19 AM    This note was created with Dragon medical transcription system.  Any errors from dictation are purely unintentional

## 2017-10-24 ENCOUNTER — Other Ambulatory Visit: Payer: Self-pay | Admitting: Orthopedic Surgery

## 2017-10-24 DIAGNOSIS — M75122 Complete rotator cuff tear or rupture of left shoulder, not specified as traumatic: Secondary | ICD-10-CM

## 2017-11-03 ENCOUNTER — Ambulatory Visit
Admission: RE | Admit: 2017-11-03 | Discharge: 2017-11-03 | Disposition: A | Discharge status: Home / Self Care | Payer: Medicare Other | Source: Ambulatory Visit | Attending: Orthopedic Surgery | Admitting: Orthopedic Surgery

## 2017-11-03 DIAGNOSIS — M25412 Effusion, left shoulder: Secondary | ICD-10-CM | POA: Insufficient documentation

## 2017-11-03 DIAGNOSIS — S46812A Strain of other muscles, fascia and tendons at shoulder and upper arm level, left arm, initial encounter: Secondary | ICD-10-CM | POA: Insufficient documentation

## 2017-11-03 DIAGNOSIS — M75122 Complete rotator cuff tear or rupture of left shoulder, not specified as traumatic: Secondary | ICD-10-CM | POA: Diagnosis present

## 2017-11-03 DIAGNOSIS — S43012A Anterior subluxation of left humerus, initial encounter: Secondary | ICD-10-CM | POA: Insufficient documentation

## 2017-11-03 DIAGNOSIS — S46112A Strain of muscle, fascia and tendon of long head of biceps, left arm, initial encounter: Secondary | ICD-10-CM | POA: Diagnosis not present

## 2017-11-03 DIAGNOSIS — S46012A Strain of muscle(s) and tendon(s) of the rotator cuff of left shoulder, initial encounter: Secondary | ICD-10-CM | POA: Insufficient documentation

## 2017-11-03 DIAGNOSIS — Z9889 Other specified postprocedural states: Secondary | ICD-10-CM | POA: Diagnosis not present

## 2017-11-03 DIAGNOSIS — X58XXXA Exposure to other specified factors, initial encounter: Secondary | ICD-10-CM | POA: Insufficient documentation

## 2018-06-22 DIAGNOSIS — M5137 Other intervertebral disc degeneration, lumbosacral region: Secondary | ICD-10-CM | POA: Insufficient documentation

## 2018-06-22 DIAGNOSIS — M47816 Spondylosis without myelopathy or radiculopathy, lumbar region: Secondary | ICD-10-CM | POA: Insufficient documentation

## 2018-06-25 ENCOUNTER — Other Ambulatory Visit: Payer: Self-pay | Admitting: Internal Medicine

## 2018-06-25 DIAGNOSIS — M47816 Spondylosis without myelopathy or radiculopathy, lumbar region: Secondary | ICD-10-CM

## 2018-06-27 ENCOUNTER — Ambulatory Visit: Payer: Medicare Other

## 2018-06-28 ENCOUNTER — Ambulatory Visit
Admission: RE | Admit: 2018-06-28 | Discharge: 2018-06-28 | Disposition: A | Discharge status: Home / Self Care | Payer: Medicare Other | Source: Ambulatory Visit | Attending: Internal Medicine | Admitting: Internal Medicine

## 2018-06-28 DIAGNOSIS — M47816 Spondylosis without myelopathy or radiculopathy, lumbar region: Secondary | ICD-10-CM | POA: Diagnosis present

## 2018-07-05 ENCOUNTER — Ambulatory Visit (INDEPENDENT_AMBULATORY_CARE_PROVIDER_SITE_OTHER): Payer: Medicare Other | Admitting: Podiatry

## 2018-07-05 ENCOUNTER — Encounter: Payer: Self-pay | Admitting: Podiatry

## 2018-07-05 VITALS — BP 133/81 | HR 72

## 2018-07-05 DIAGNOSIS — M722 Plantar fascial fibromatosis: Secondary | ICD-10-CM | POA: Diagnosis not present

## 2018-07-05 DIAGNOSIS — M2170 Unequal limb length (acquired), unspecified site: Secondary | ICD-10-CM | POA: Diagnosis not present

## 2018-07-05 DIAGNOSIS — M2011 Hallux valgus (acquired), right foot: Secondary | ICD-10-CM | POA: Diagnosis not present

## 2018-07-05 DIAGNOSIS — D689 Coagulation defect, unspecified: Secondary | ICD-10-CM

## 2018-07-05 DIAGNOSIS — L603 Nail dystrophy: Secondary | ICD-10-CM | POA: Diagnosis not present

## 2018-07-05 NOTE — Progress Notes (Signed)
This patient presents the office today with 3 distinct foot problems he desires to discuss.  First he says that he has a pair of orthoses which have a heel lift.  He has been wearing these orthoses for over 20 years.  He has already talk to Liliane Channel and Liliane Channel has agreed to provide him with a new pair of orthoses with a heel lift once he is evaluated by myself.  Patient does have a previous history of scoliosis which allows for a limb length discrepancy with the left foot is shorter than his right.  He also says that he has a painful bunion on the big toe of the right foot.  He says he has done research on them that and he desires minimal incision surgery.  He states that the bunion is painful walking and wearing his shoes.  Finally he has a dead nail noted on the big toe of the left foot.  Patient states that there is no drainage or pain related to the unattached nail.  He presents the office today for an evaluation and treatment of these conditions. Patient is taking xarelto.  Vascular  Dorsalis pedis and posterior tibial pulses are palpable  B/L.  Capillary return  WNL.  Temperature gradient is  WNL.  Skin turgor  WNL  Sensorium  Senn Weinstein monofilament wire  WNL. Normal tactile sensation.  Nail Exam  Patient has normal nails with no evidence of bacterial or fungal infection. Unattached nail plate left hallux with no redness or drainage noted.  Orthopedic  Exam  Muscle tone and muscle strength  WNL.  No limitations of motion feet  B/L.  No crepitus or joint effusion noted.  Foot type is unremarkable and digits show no abnormalities.  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  Skin  No open lesions.  Normal skin texture and turgor.  Limb length discrepancy left leg.  Nail Dystrophy left hallux nail.  HAV  B/L.  IE.  Discussed these diagnoses with this patient.  Told him that he does have a limb length discrepancy and he needs a new pair of orthoses with a heel lift.  He will make an appointment with  Liliane Channel for evaluation.  He does have an unattached nail plate left hallux which is causing no pain or discomfort.  Discussed conservative versus surgical treatment and we decided to treat conservatively.  Patient is also interested in minimal incision surgery for the correction of the bunion of the big toe joint right foot.  Told him that none of the doctors in this practice performed minimal incision surgery.  Told him I would gladly refer him to the office doctors if he wants to have his surgery done openly.  He is to think about this and call the office in the future.   Gardiner Barefoot DPM

## 2018-08-06 ENCOUNTER — Other Ambulatory Visit: Payer: Medicare Other | Admitting: Orthotics

## 2019-04-15 ENCOUNTER — Other Ambulatory Visit: Payer: Self-pay | Admitting: *Deleted

## 2019-04-15 DIAGNOSIS — Z20822 Contact with and (suspected) exposure to covid-19: Secondary | ICD-10-CM

## 2019-04-17 LAB — NOVEL CORONAVIRUS, NAA: SARS-CoV-2, NAA: NOT DETECTED

## 2019-06-10 ENCOUNTER — Ambulatory Visit: Payer: Medicare Other | Attending: Internal Medicine

## 2019-06-10 DIAGNOSIS — Z20822 Contact with and (suspected) exposure to covid-19: Secondary | ICD-10-CM

## 2019-06-11 LAB — NOVEL CORONAVIRUS, NAA: SARS-CoV-2, NAA: NOT DETECTED

## 2019-07-05 ENCOUNTER — Ambulatory Visit: Payer: Medicare Other

## 2019-08-14 ENCOUNTER — Other Ambulatory Visit: Payer: Self-pay | Admitting: Orthopedic Surgery

## 2019-08-14 DIAGNOSIS — M75101 Unspecified rotator cuff tear or rupture of right shoulder, not specified as traumatic: Secondary | ICD-10-CM

## 2019-08-14 DIAGNOSIS — M25511 Pain in right shoulder: Secondary | ICD-10-CM

## 2019-08-26 ENCOUNTER — Other Ambulatory Visit: Payer: Self-pay

## 2019-08-26 ENCOUNTER — Ambulatory Visit
Admission: RE | Admit: 2019-08-26 | Discharge: 2019-08-26 | Disposition: A | Discharge status: Home / Self Care | Payer: Medicare Other | Source: Ambulatory Visit | Attending: Orthopedic Surgery | Admitting: Orthopedic Surgery

## 2019-08-26 DIAGNOSIS — M25512 Pain in left shoulder: Secondary | ICD-10-CM | POA: Diagnosis present

## 2019-08-26 DIAGNOSIS — M25511 Pain in right shoulder: Secondary | ICD-10-CM | POA: Diagnosis present

## 2019-08-26 DIAGNOSIS — M75101 Unspecified rotator cuff tear or rupture of right shoulder, not specified as traumatic: Secondary | ICD-10-CM

## 2019-11-23 ENCOUNTER — Other Ambulatory Visit: Payer: Self-pay

## 2019-11-23 DIAGNOSIS — R519 Headache, unspecified: Secondary | ICD-10-CM | POA: Insufficient documentation

## 2019-11-23 DIAGNOSIS — I10 Essential (primary) hypertension: Secondary | ICD-10-CM | POA: Insufficient documentation

## 2019-11-23 DIAGNOSIS — Z86711 Personal history of pulmonary embolism: Secondary | ICD-10-CM | POA: Insufficient documentation

## 2019-11-23 DIAGNOSIS — Z79899 Other long term (current) drug therapy: Secondary | ICD-10-CM | POA: Diagnosis not present

## 2019-11-23 DIAGNOSIS — Z20822 Contact with and (suspected) exposure to covid-19: Secondary | ICD-10-CM | POA: Diagnosis not present

## 2019-11-23 DIAGNOSIS — Z7901 Long term (current) use of anticoagulants: Secondary | ICD-10-CM | POA: Diagnosis not present

## 2019-11-23 DIAGNOSIS — R0981 Nasal congestion: Secondary | ICD-10-CM | POA: Insufficient documentation

## 2019-11-23 LAB — TROPONIN I (HIGH SENSITIVITY): Troponin I (High Sensitivity): 7 ng/L (ref <18)

## 2019-11-23 LAB — BASIC METABOLIC PANEL
Anion gap: 6 (ref 5–15)
BUN: 17 mg/dL (ref 8–23)
CO2: 24 mmol/L (ref 22–32)
Calcium: 8.7 mg/dL — ABNORMAL LOW (ref 8.9–10.3)
Chloride: 106 mmol/L (ref 98–111)
Creatinine, Ser: 0.96 mg/dL (ref 0.61–1.24)
GFR calc Af Amer: >60 mL/min (ref >60)
GFR calc non Af Amer: >60 mL/min (ref >60)
Glucose, Bld: 104 mg/dL — ABNORMAL HIGH (ref 70–99)
Potassium: 4 mmol/L (ref 3.5–5.1)
Sodium: 136 mmol/L (ref 135–145)

## 2019-11-23 LAB — CBC
HCT: 45 % (ref 39.0–52.0)
Hemoglobin: 15.7 g/dL (ref 13.0–17.0)
MCH: 28.9 pg (ref 26.0–34.0)
MCHC: 34.9 g/dL (ref 30.0–36.0)
MCV: 82.9 fL (ref 80.0–100.0)
Platelets: 284 10*3/uL (ref 150–400)
RBC: 5.43 MIL/uL (ref 4.22–5.81)
RDW: 14.9 % (ref 11.5–15.5)
WBC: 6.9 10*3/uL (ref 4.0–10.5)
nRBC: 0 % (ref 0.0–0.2)

## 2019-11-23 NOTE — ED Triage Notes (Signed)
Pt arrives via POV to ED with headache and congestion for 3 days.  Pt states he recently had a UTI, pt states [place] get the prescribed abx there so he was given something different.

## 2019-11-24 ENCOUNTER — Emergency Department
Admission: EM | Admit: 2019-11-24 | Discharge: 2019-11-24 | Disposition: A | Discharge status: Home / Self Care | Payer: Medicare Other | Attending: Student in an Organized Health Care Education/Training Program | Admitting: Student in an Organized Health Care Education/Training Program

## 2019-11-24 ENCOUNTER — Emergency Department: Payer: Medicare Other

## 2019-11-24 DIAGNOSIS — R0981 Nasal congestion: Secondary | ICD-10-CM

## 2019-11-24 LAB — SARS CORONAVIRUS 2 BY RT PCR (HOSPITAL ORDER, PERFORMED IN ~~LOC~~ HOSPITAL LAB): SARS Coronavirus 2: NEGATIVE

## 2019-11-24 LAB — URINALYSIS, COMPLETE (UACMP) WITH MICROSCOPIC
Bilirubin Urine: NEGATIVE
Glucose, UA: NEGATIVE mg/dL
Hgb urine dipstick: NEGATIVE
Ketones, ur: NEGATIVE mg/dL
Leukocytes,Ua: NEGATIVE
Nitrite: NEGATIVE
Protein, ur: NEGATIVE mg/dL
Specific Gravity, Urine: 1.005 (ref 1.005–1.030)
pH: 6 (ref 5.0–8.0)

## 2019-11-24 LAB — TROPONIN I (HIGH SENSITIVITY): Troponin I (High Sensitivity): 5 ng/L (ref <18)

## 2019-11-24 MED ORDER — DOXYCYCLINE HYCLATE 50 MG PO CAPS
100.0000 mg | ORAL_CAPSULE | Freq: Two times a day (BID) | ORAL | 0 refills | Status: DC
Start: 2019-11-24 — End: 2019-11-24

## 2019-11-24 MED ORDER — DOXYCYCLINE HYCLATE 50 MG PO CAPS
100.0000 mg | ORAL_CAPSULE | Freq: Two times a day (BID) | ORAL | 0 refills | Status: AC
Start: 2019-11-24 — End: 2019-11-29

## 2019-11-24 MED ORDER — DOXYCYCLINE HYCLATE 100 MG PO TABS
100.0000 mg | ORAL_TABLET | Freq: Once | ORAL | Status: AC
  Administered 2019-11-24: 100 mg via ORAL
  Filled 2019-11-24: qty 1

## 2019-11-24 MED ORDER — DOXYCYCLINE HYCLATE 100 MG PO TABS
100.0000 mg | ORAL_TABLET | Freq: Two times a day (BID) | ORAL | 0 refills | Status: AC
Start: 2019-11-24 — End: 2019-11-29

## 2019-11-24 NOTE — ED Provider Notes (Signed)
[facility] Emergency Department Provider Note    First MD Initiated Contact with Patient 11/24/19 0154     (approximate)  I have reviewed the triage vital signs and the nursing notes.   HISTORY  Chief Complaint Headache and Nasal Congestion    HPI Vincent Cole is a 69 y.o. male below's past medical history presents to the ER for congestion and headache.  Patient just returned from 3 weeks in [place].  States he was recently placed on antibiotics for bladder infection states has had similar issues with bladder infections in the past.  Denies any abdominal pain.  No flank pain.  No neck stiffness.  No fevers.  States he did get his Covid vaccination.  States he was only in his city and not in a malaria prone area.    Past Medical History:  Diagnosis Date  . Benign bladder mass   . Frequency of urination   . GERD (gastroesophageal reflux disease) no meds  . History of recurrent UTIs   . Hypertension   . IC (interstitial cystitis)   . Nocturia   . OSA on CPAP nightly  . Pelvic pain in male   . Simple renal cyst   . Urgency of urination   . Urinary hesitancy    Family History  Problem Relation Age of Onset  . Heart disease Mother    Past Surgical History:  Procedure Laterality Date  . CYSTO WITH HYDRODISTENSION  06/02/2011   Procedure: CYSTOSCOPY/HYDRODISTENSION;  Surgeon: Reece Packer, MD;  Location: [facility];  Service: Urology;  Laterality: N/A;  catheter placement  . 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 KNEE ARTHROSCOPY    . TRANSURETHRAL RESECTION OF PROSTATE  X3   LAST ONE MAY 2012   Patient Active Problem List   Diagnosis Date Noted  . PE (pulmonary thromboembolism) ([place]) 10/20/2017  . Pain in limb 10/20/2017  . 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 Hypertension 05/21/2015  . Arthritis of left knee 05/21/2015      Prior to Admission medications   Medication Sig Start Date End Date Taking?  Authorizing Provider  acetaminophen (TYLENOL) 500 MG tablet Take 500 mg by mouth every 6 (six) hours as needed.      [provider]  amLODipine-benazepril (LOTREL) 5-20 MG per capsule Take 1 capsule by mouth daily.    [provider]  Cholecalciferol (VITAMIN D3) 1000 units CAPS TAKE ONE CAPSULE BY MOUTH DAILY 01/20/17   [provider]  docusate sodium (COLACE) 100 MG capsule Take 100 mg by mouth 2 (two) times daily. 07/28/17   [provider]  doxycycline (VIBRA-TABS) 100 MG tablet Take 1 tablet (100 mg total) by mouth 2 (two) times daily for 5 days. 11/24/19 11/29/19  Merlyn Lot, MD  doxycycline (VIBRAMYCIN) 50 MG capsule Take 2 capsules (100 mg total) by mouth 2 (two) times daily for 5 days. 11/24/19 11/29/19  Merlyn Lot, MD  gabapentin (NEURONTIN) 100 MG capsule Take by mouth daily. 08/17/17   [provider]  hydrocortisone cream 1 % Apply topically.    [provider]  hydrOXYzine (ATARAX/VISTARIL) 25 MG tablet hydroxyzine HCl 25 mg tablet  TAKE 1-2 TABS BY MOUTH 1 HOUR BEFORE SLEEP 04/24/17   [provider]  meloxicam (MOBIC) 15 MG tablet  08/31/17   [provider]  Multiple Vitamin (MULTIVITAMIN) tablet Take 1 tablet by mouth daily.      [provider]  oxybutynin (DITROPAN-XL) 5 MG  24 hr tablet TAKE 2 TABS BY MOUTH DAILY AS NEEDED FOR BLADDER SPASMS/PELVIC PAIN 08/24/17   [provider]  pentosan polysulfate (ELMIRON) 100 MG capsule Elmiron 100 mg capsule  TAKE 1 CAPSULE (100 MG TOTAL) BY MOUTH 3 TIMES DAILY BEFORE MEALS 04/24/17   [provider]  Polyethylene Glycol 3350 (PEG 3350) POWD Take 17 g by mouth. 08/12/17   [provider]  Rivaroxaban (XARELTO) 15 MG TABS tablet Take 15 mg by mouth daily.    [provider]  sulfamethoxazole-trimethoprim (BACTRIM DS,SEPTRA DS) 800-160 MG tablet Take 1 tablet by mouth 2 (two) times daily. 04/23/17   Triplett, Johnette Abraham B, FNP    Tamsulosin HCl (FLOMAX) 0.4 MG CAPS Take 0.4 mg by mouth daily.    [provider]  traMADol (ULTRAM) 50 MG tablet tramadol 50 mg tablet  TAKE 1 TABLET(S) EVERY 6 HOURS BY ORAL ROUTE AS NEEDED.      [provider]  traZODone (DESYREL) 50 MG tablet TAKE 1 TABLET BY MOUTH EVERY DAY AT NIGHT 12/19/16   [provider]  zolpidem (AMBIEN) 5 MG tablet Take 5 mg by mouth at bedtime as needed for sleep.    [provider]    Allergies Patient has no known allergies.    Social History Social History   Tobacco Use  . Smoking status: Never Smoker  . Smokeless tobacco: Never Used  Substance Use Topics  . Alcohol use: Not Currently  . Drug use: No    Review of Systems Patient denies headaches, rhinorrhea, blurry vision, numbness, shortness of breath, chest pain, edema, cough, abdominal pain, nausea, vomiting, diarrhea, dysuria, fevers, rashes or hallucinations unless otherwise stated above in HPI. ____________________________________________   PHYSICAL EXAM:  VITAL SIGNS: Vitals:   11/24/19 0229 11/24/19 0302  BP: (!) 155/108 (!) 153/106  Pulse: 78 71  Resp: 18 20  Temp:    SpO2: 97% 100%    Constitutional: Alert and oriented.  Eyes: Conjunctivae are normal.  Head: Atraumatic. Nose: No congestion/rhinnorhea. Mouth/Throat: Mucous membranes are moist.   Neck: No stridor. Painless ROM.  Cardiovascular: Normal rate, regular rhythm. Grossly normal heart sounds.  Good peripheral circulation. Respiratory: Normal respiratory effort.  No retractions. Lungs CTAB. Gastrointestinal: Soft and nontender. No distention. No abdominal bruits. No CVA tenderness. Genitourinary:  Musculoskeletal: No lower extremity tenderness nor edema.  No joint effusions. Neurologic:  Normal speech and language. No gross focal neurologic deficits are appreciated. No facial droop Skin:  Skin is warm, dry and intact. No rash noted. Psychiatric: Mood and affect are normal.  Speech and behavior are normal.  ____________________________________________   LABS (all labs ordered are listed, but only abnormal results are displayed)  Results for orders placed or performed during the hospital encounter of 11/24/19 (from the past 24 hour(s))  Basic metabolic panel     Status: Abnormal   Collection Time: 11/23/19  8:54 PM  Result Value Ref Range   Sodium 136 135 - 145 mmol/L   Potassium 4.0 3.5 - 5.1 mmol/L   Chloride 106 98 - 111 mmol/L   CO2 24 22 - 32 mmol/L   Glucose, Bld 104 (H) 70 - 99 mg/dL   BUN 17 8 - 23 mg/dL   Creatinine, Ser 0.96 0.61 - 1.24 mg/dL   Calcium 8.7 (L) 8.9 - 10.3 mg/dL   GFR calc non Af Amer >60 >60 mL/min   GFR calc Af Amer >60 >60 mL/min   Anion gap 6 5 - 15  CBC  Status: None   Collection Time: 11/23/19  8:54 PM  Result Value Ref Range   WBC 6.9 4.0 - 10.5 K/uL   RBC 5.43 4.22 - 5.81 MIL/uL   Hemoglobin 15.7 13.0 - 17.0 g/dL   HCT 45.0 39 - 52 %   MCV 82.9 80.0 - 100.0 fL   MCH 28.9 26.0 - 34.0 pg   MCHC 34.9 30.0 - 36.0 g/dL   RDW 14.9 11.5 - 15.5 %   Platelets 284 150 - 400 K/uL   nRBC 0.0 0.0 - 0.2 %  Troponin I (High Sensitivity)     Status: None   Collection Time: 11/23/19  8:54 PM  Result Value Ref Range   Troponin I (High Sensitivity) 7 <18 ng/L  Urinalysis, Complete w Microscopic     Status: Abnormal   Collection Time: 11/24/19  2:07 AM  Result Value Ref Range   Color, Urine STRAW (A) YELLOW   APPearance CLEAR (A) CLEAR   Specific Gravity, Urine 1.005 1.005 - 1.030   pH 6.0 5.0 - 8.0   Glucose, UA NEGATIVE NEGATIVE mg/dL   Hgb urine dipstick NEGATIVE NEGATIVE   Bilirubin Urine NEGATIVE NEGATIVE   Ketones, ur NEGATIVE NEGATIVE mg/dL   Protein, ur NEGATIVE NEGATIVE mg/dL   Nitrite NEGATIVE NEGATIVE   Leukocytes,Ua NEGATIVE NEGATIVE   RBC / HPF 0-5 0 - 5 RBC/hpf   WBC, UA 0-5 0 - 5 WBC/hpf   Bacteria, UA RARE (A) NONE SEEN   Squamous Epithelial / LPF 0-5 0 - 5    ____________________________________________  EKG My review and personal interpretation at Time: 20:49   Indication: congestion  Rate: 75  Rhythm: sinus Axis: normal Other: normal intervals, no stemi ____________________________________________  RADIOLOGY  I personally reviewed all radiographic images ordered to evaluate for the above acute complaints and reviewed radiology reports and findings.  These findings were personally discussed with the patient.  Please see medical record for radiology report.  ____________________________________________   PROCEDURES  Procedure(s) performed:  Procedures    Critical Care performed: no ____________________________________________   INITIAL IMPRESSION / ASSESSMENT AND PLAN / ED COURSE  Pertinent labs & imaging results that were available during my care of the patient were reviewed by me and considered in my medical decision making (see chart for details).   DDX: uri, covid, pna, uti, sinusitis, doubt mening, encephalitis, malaria  Vincent Cole is a 69 y.o. who presents to the ED with symptoms as described above recently having travel to [place].  He is clinically very well-appearing no acute distress, not c/w mening or encephalitis.  Exam is reassuring.  No sirs criteria primary complaint is nasal congestion as well as urinary frequency.  Will check urine.  States he got his Covid vaccine but will repeat that.  Will check chest x-ray and observe.    Clinical Course as of Nov 24 319  Sun Nov 24, 2019  0303 Patient's work-up is grossly reassuring.  Given his nasal congestion will cover with Doxy.  He does not meet any septic criteria.  Neuro exam is nonfocal.  Do not believe that additional diagnostic testing on an emergent basis is clinically indicated with his well appearance and exam.   [PR]    Clinical Course User Index [PR] Merlyn Lot, MD    The patient was evaluated in Emergency Department today for the symptoms  described in the history of present illness. He/she was evaluated in the context of the global COVID-19 pandemic, which necessitated consideration that the patient might  be at risk for infection with the SARS-CoV-2 virus that causes COVID-19. Institutional protocols and algorithms that pertain to the evaluation of patients at risk for COVID-19 are in a state of rapid change based on information released by regulatory bodies including the CDC and federal and state organizations. These policies and algorithms were followed during the patient's care in the ED.  As part of my medical decision making, I reviewed the following data within the [place] notes reviewed and incorporated, Labs reviewed, notes from prior ED visits and [place] Controlled Substance Database   ____________________________________________   FINAL CLINICAL IMPRESSION(S) / ED DIAGNOSES  Final diagnoses:  Nasal congestion      NEW MEDICATIONS STARTED DURING THIS VISIT:  New Prescriptions   DOXYCYCLINE (VIBRA-TABS) 100 MG TABLET    Take 1 tablet (100 mg total) by mouth 2 (two) times daily for 5 days.   DOXYCYCLINE (VIBRAMYCIN) 50 MG CAPSULE    Take 2 capsules (100 mg total) by mouth 2 (two) times daily for 5 days.     Note:  This document was prepared using Dragon voice recognition software and may include unintentional dictation errors.    Merlyn Lot, MD 11/24/19 [phone]

## 2020-02-03 DIAGNOSIS — Z86718 Personal history of other venous thrombosis and embolism: Secondary | ICD-10-CM | POA: Insufficient documentation

## 2020-02-03 DIAGNOSIS — R600 Localized edema: Secondary | ICD-10-CM | POA: Insufficient documentation

## 2020-02-03 HISTORY — DX: Personal history of other venous thrombosis and embolism: Z86.718

## 2020-02-18 IMAGING — MR MR LUMBAR SPINE W/O CM
5 series · 31 of 48 positions shown · non-contrast
Comparison: CT Abdomen and Pelvis 01/31/2011 and earlier.

CLINICAL DATA: 67-year-old male with lumbar back pain, bilateral
leg pain and numbness for 5-6 months.

EXAM:
MRI LUMBAR SPINE WITHOUT CONTRAST
TECHNIQUE: Multiplanar, multisequence MR imaging of the lumbar spine was
performed. No intravenous contrast was administered.

[Series 5: T2 · sagittal · 4.0mm · 0.75mm/px · 7 of 17 slices shown (1 of 2)]
[im 1/17]
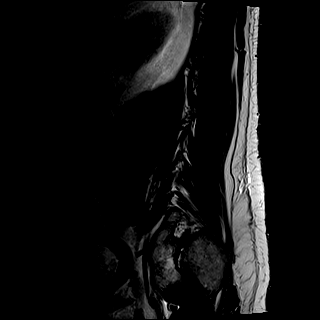
[im 3/17]
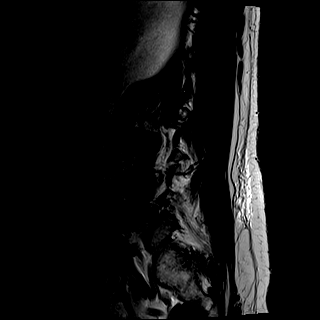
[im 6/17]
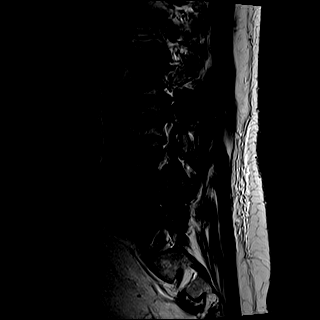
[im 9/17]
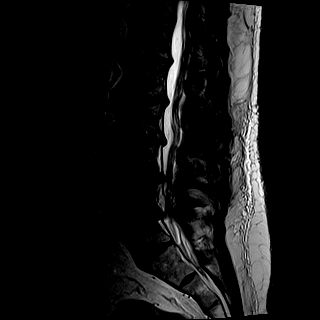
[im 11/17]
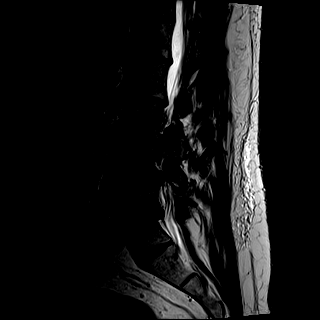
[im 14/17]
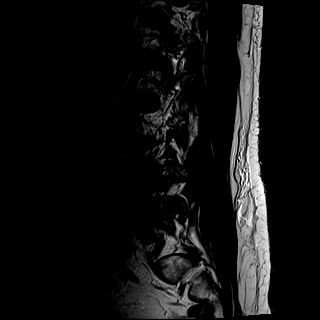
[im 17/17]
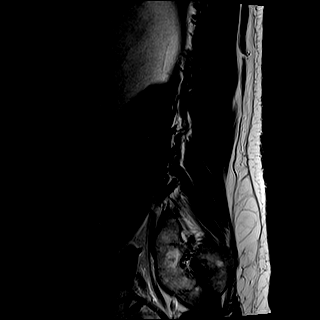

[Series 6: T1 · sagittal · 4.0mm · 0.81mm/px · 7 of 17 slices shown (1 of 2)]
[im 1/17]
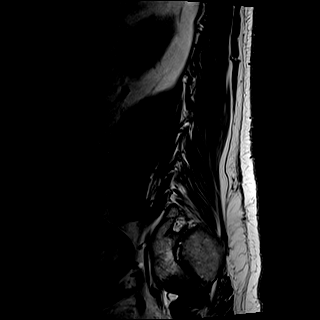
[im 3/17]
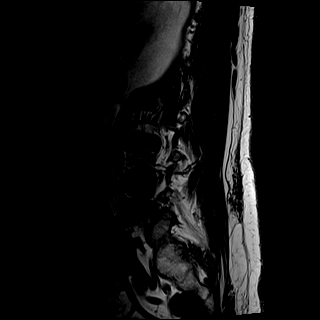
[im 6/17]
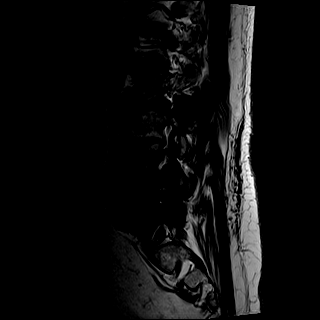
[im 9/17]
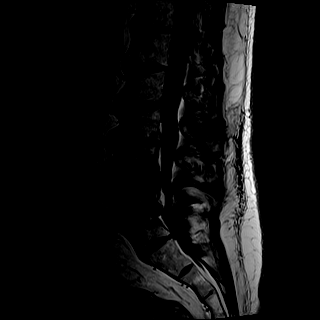
[im 11/17]
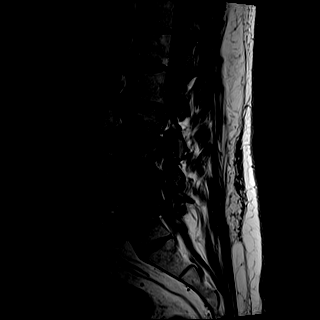
[im 14/17]
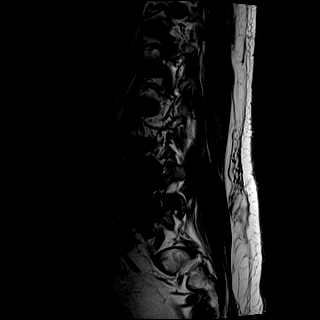
[im 17/17]
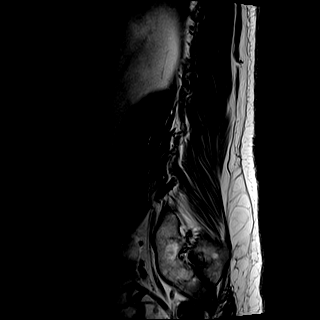

[Series 7: STIR · sagittal · 4.0mm · 0.41mm/px · 1 of 17 slices shown]
[im 1/17]
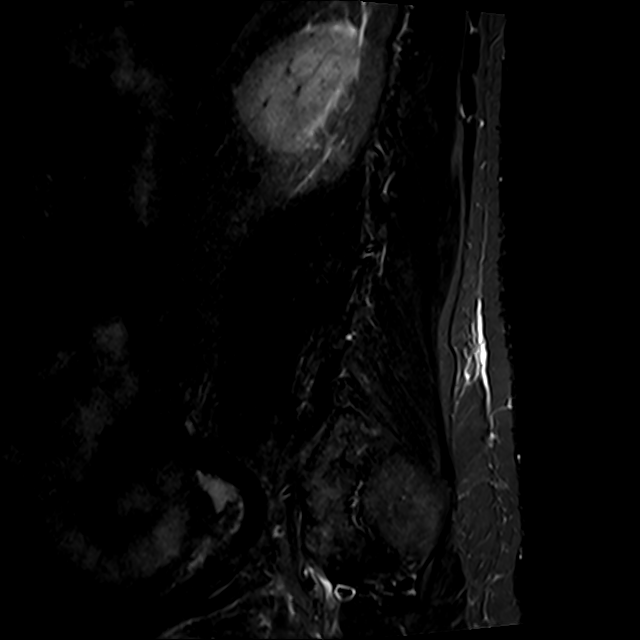

[Series 8: T2 · axial · 4.0mm · 0.78mm/px · z∈[-65,+150]mm · 8 of 38 slices shown (2 of 2)]
[im 1/38]
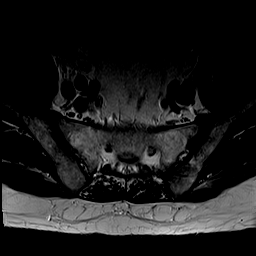
[im 6/38]
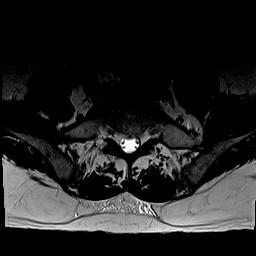
[im 12/38]
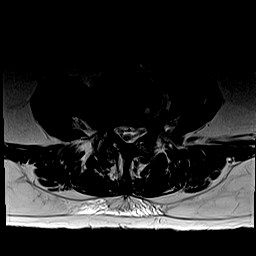
[im 18/38]
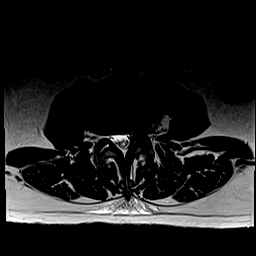
[im 20/38]
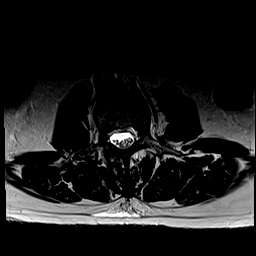
[im 26/38]
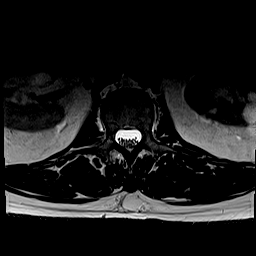
[im 32/38]
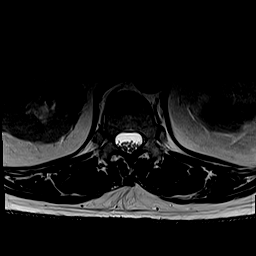
[im 38/38]
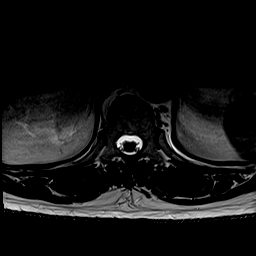

[Series 9: T1 · axial · 4.0mm · 0.39mm/px · z∈[-65,+150]mm · 8 of 38 slices shown (2 of 2)]
[im 1/38]
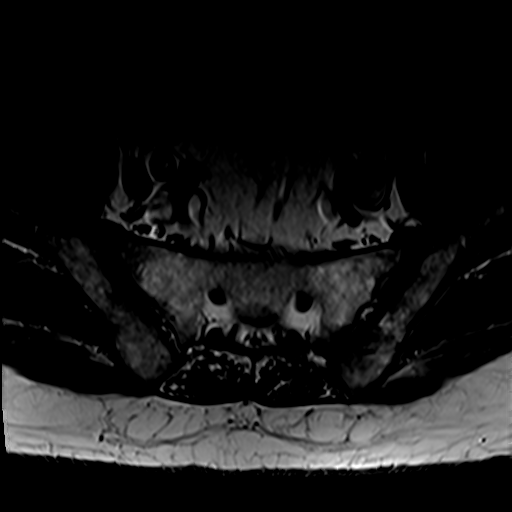
[im 6/38]
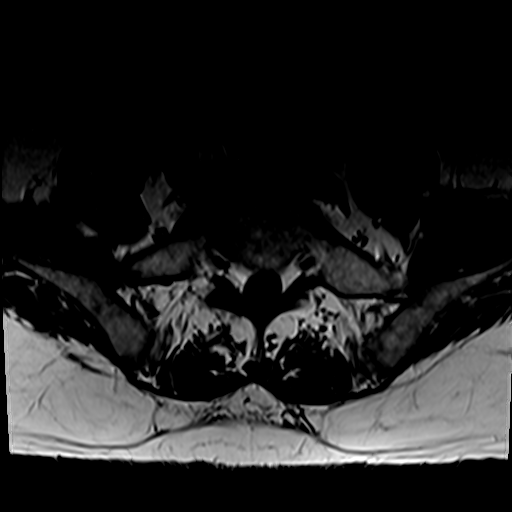
[im 12/38]
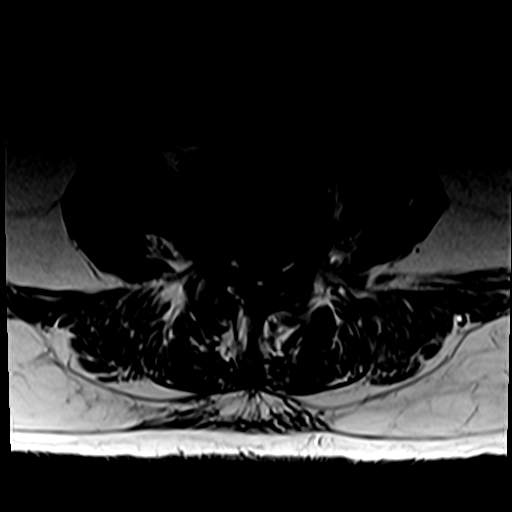
[im 18/38]
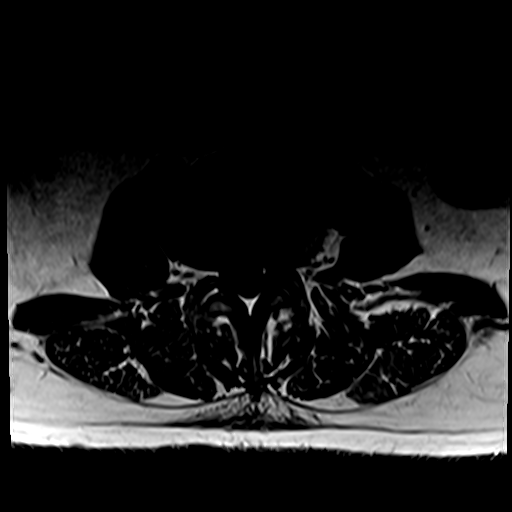
[im 20/38]
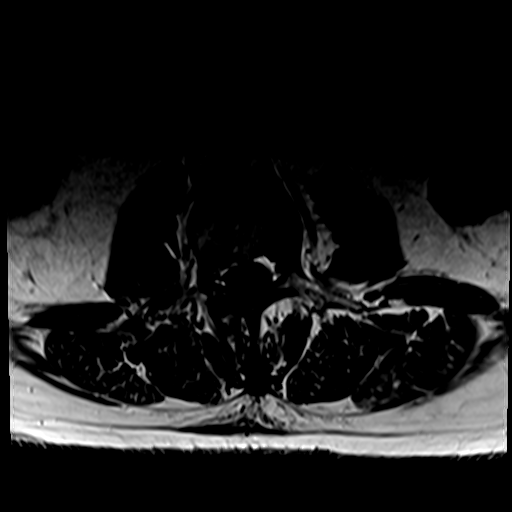
[im 26/38]
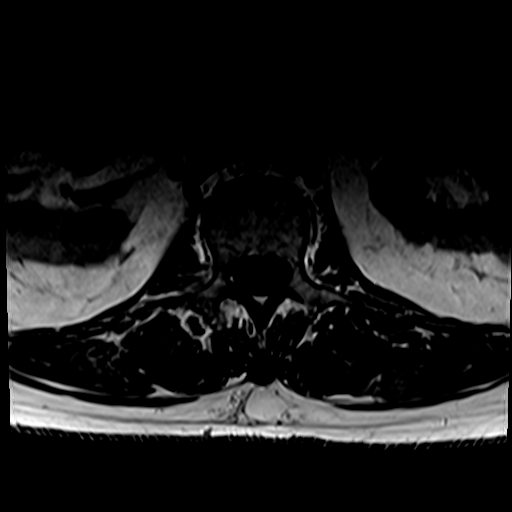
[im 32/38]
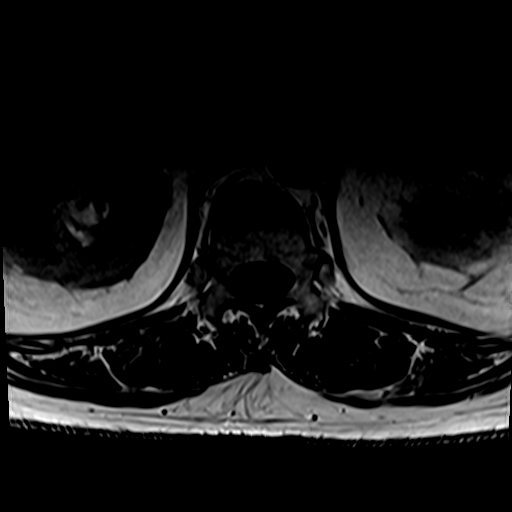
[im 38/38]
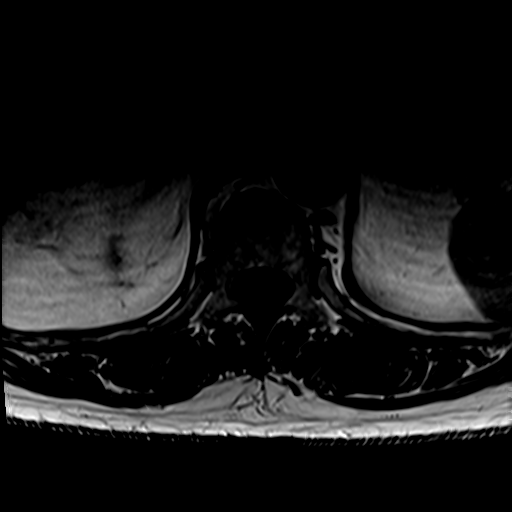

[31 of 48 positions shown; findings below may reference images not displayed]

FINDINGS: Segmentation: Judging from the prior CTs L5 is sacralized. Still
there is a nearly full size L5-S1 disc space. Correlation with
radiographs is recommended prior to any operative intervention.

Alignment: Straightening of lumbar lordosis with mild lower lumbar
scoliosis. No spondylolisthesis.

Vertebrae: Bulky degenerative endplate spurring L2-L3 through L4-L5.
Chronic degenerative endplate marrow signal changes. No marrow edema
or evidence of acute osseous abnormality. Intact visible sacrum and
SI joints.

Conus medullaris and cauda equina: Conus extends to the T12 level.
No lower spinal cord or conus signal abnormality.

Paraspinal and other soft tissues: Negative. Chronic benign renal
cysts are partially visible.

Disc levels:

T11-T12: Minimal disc bulge. Small chronic endplate Schmorl's nodes.
Mild facet hypertrophy greater on the right. No stenosis.

T12-L1: Disc desiccation with circumferential disc bulge and mild
posterior element hypertrophy. No stenosis.

L1-L2: Disc space loss with circumferential disc osteophyte complex.
Mild to moderate posterior element hypertrophy. Borderline to mild
right lateral recess stenosis (right L2 nerve level) and spinal
stenosis. Mild to moderate right L1 foraminal stenosis.

L2-L3: Severe disc space loss and bulky circumferential disc
osteophyte complex eccentric to the left. Mild to moderate posterior
element hypertrophy. Up to mild spinal but no convincing lateral
recess stenosis. Moderate left L2 foraminal stenosis.

L3-L4: Severe disc space loss and bulky circumferential disc
osteophyte complex. Broad-based posterior component and moderate
posterior element hypertrophy. Moderate to severe spinal stenosis
(series 8, image 26) with at least moderate bilateral lateral recess
stenosis (L4 nerve levels). Moderate to severe left and moderate
right L3 foraminal stenosis.

L4-L5: Severe disc space loss with bulky circumferential disc
osteophyte complex eccentric to the right. Mild to moderate
posterior element hypertrophy greater on the right. Mild spinal
stenosis. Borderline to mild bilateral lateral recess stenosis (L5
nerve levels). Moderate left and moderate to severe right L4
foraminal stenosis.

L5-S1:  Sacralized, no stenosis.
IMPRESSION: 1. Transitional anatomy with sacralized L5 level. Correlation with
radiographs is recommended prior to any operative intervention.
2. Severe chronic disc and endplate degeneration L2-L3 through L4-L5
with varying degrees of posterior element hypertrophy.
3. L3-L4 moderate to severe spinal, bilateral lateral recess, and
left greater than right neural foraminal stenosis.
4. Mild spinal stenosis at L2-L3 and L4-L5. Up to moderate left L2
and moderate to severe right greater than left L4 neural foraminal
stenosis.
5. Borderline to mild spinal stenosis at L1-L2 with up to moderate
right L1 foraminal stenosis.

## 2020-11-17 DIAGNOSIS — Z6836 Body mass index (BMI) 36.0-36.9, adult: Secondary | ICD-10-CM | POA: Insufficient documentation

## 2020-12-31 ENCOUNTER — Other Ambulatory Visit: Payer: Self-pay | Admitting: Podiatry

## 2021-01-06 ENCOUNTER — Other Ambulatory Visit: Payer: Self-pay

## 2021-01-06 ENCOUNTER — Other Ambulatory Visit
Admission: RE | Admit: 2021-01-06 | Discharge: 2021-01-06 | Disposition: A | Discharge status: Home / Self Care | Payer: Medicare Other | Source: Ambulatory Visit | Attending: Podiatry | Admitting: Podiatry

## 2021-01-06 HISTORY — DX: Other specified postprocedural states: R11.2

## 2021-01-06 HISTORY — DX: Unspecified osteoarthritis, unspecified site: M19.90

## 2021-01-06 HISTORY — DX: Other complications of anesthesia, initial encounter: T88.59XA

## 2021-01-06 HISTORY — DX: Cardiac murmur, unspecified: R01.1

## 2021-01-06 HISTORY — DX: Other specified postprocedural states: Z98.890

## 2021-01-06 HISTORY — DX: Depression, unspecified: F32.A

## 2021-01-06 NOTE — Patient Instructions (Addendum)
Your procedure is scheduled on: 01/15/21 -  Friday Report to the Registration Desk on the 1st floor of the [facility]. To find out your arrival time, please call [phone] between 1PM - 3PM on: 01/16/21 - Thursday  REMEMBER: Instructions that are not followed completely may result in serious medical risk, up to and including death; or upon the discretion of your surgeon and anesthesiologist your surgery may need to be rescheduled.  Do not eat food after midnight the night before surgery.  No gum chewing, lozengers or hard candies.  You may however, drink CLEAR liquids up to 2 hours before you are scheduled to arrive for your surgery. Do not drink anything within 2 hours of your scheduled arrival time.  Clear liquids include: - water  - apple juice without pulp - gatorade (not RED, PURPLE, OR BLUE) - black coffee or tea (Do NOT add milk or creamers to the coffee or tea) Do NOT drink anything that is not on this list.  Type 1 and Type 2 diabetics should only drink water.  In addition, your doctor has ordered for you to drink the provided  Ensure Pre-Surgery Clear Carbohydrate Drink  Drinking this carbohydrate drink up to two hours before surgery helps to reduce insulin resistance and improve patient outcomes. Please complete drinking 2 hours prior to scheduled arrival time.  TAKE THESE MEDICATIONS THE MORNING OF SURGERY WITH A SIP OF WATER: - DULoxetine (CYMBALTA) 30 MG capsule - gabapentin (NEURONTIN) 300 MG capsule  One week prior to surgery: Stop Anti-inflammatories (NSAIDS) such as Advil, Aleve, Ibuprofen, Motrin, Naproxen, Naprosyn and Aspirin based products such as Excedrin, Goodys Powder, BC Powder.  Stop ANY OVER THE COUNTER supplements until after surgery.  You may take Tylenol if needed for pain up until the day of surgery.  No Alcohol for 24 hours before or after surgery.  No Smoking including e-cigarettes for 24 hours prior to surgery.  No chewable tobacco  products for at least 6 hours prior to surgery.  No nicotine patches on the day of surgery.  Do not use any "recreational" drugs for at least a week prior to your surgery.  Please be advised that the combination of cocaine and anesthesia may have negative outcomes, up to and including death. If you test positive for cocaine, your surgery will be cancelled.  On the morning of surgery brush your teeth with toothpaste and water, you may rinse your mouth with mouthwash if you wish. Do not swallow any toothpaste or mouthwash.  Do not wear jewelry, make-up, hairpins, clips or nail polish.  Do not wear lotions, powders, or perfumes.   Do not shave body from the neck down 48 hours prior to surgery just in case you cut yourself which could leave a site for infection.  Also, freshly shaved skin may become irritated if using the CHG soap.  Contact lenses, hearing aids and dentures may not be worn into surgery.  Do not bring valuables to the hospital. [facility] is not responsible for any missing/lost belongings or valuables.   Use CHG Soap or wipes as directed on instruction sheet.  Bring your C-PAP to the hospital with you in case you may have to spend the night.   Notify your doctor if there is any change in your medical condition (cold, fever, infection).  Wear comfortable clothing (specific to your surgery type) to the hospital.  After surgery, you can help prevent lung complications by doing breathing exercises.  Take deep breaths and cough  every 1-2 hours. Your doctor may order a device called an Incentive Spirometer to help you take deep breaths. When coughing or sneezing, hold a pillow firmly against your incision with both hands. This is called "splinting." Doing this helps protect your incision. It also decreases belly discomfort.  If you are being admitted to the hospital overnight, leave your suitcase in the car. After surgery it may be brought to your room.  If you are being  discharged the day of surgery, you will not be allowed to drive home. You will need a responsible adult (18 years or older) to drive you home and stay with you that night.   If you are taking public transportation, you will need to have a responsible adult (18 years or older) with you. Please confirm with your physician that it is acceptable to use public transportation.   Please call the [facility] at [phone] if you have any questions about these instructions.  Surgery Visitation Policy:  Patients undergoing a surgery or procedure may have one family member or support person with them as long as that person is not COVID-19 positive or experiencing its symptoms.  That person may remain in the waiting area during the procedure.  Inpatient Visitation:    Visiting hours are 7 a.m. to 8 p.m. Inpatients will be allowed two visitors daily. The visitors may change each day during the patient's stay. No visitors under the age of 40. Any visitor under the age of 51 must be accompanied by an adult. The visitor must pass COVID-19 screenings, use hand sanitizer when entering and exiting the patient's room and wear a mask at all times, including in the patient's room. Patients must also wear a mask when staff or their visitor are in the room. Masking is required regardless of vaccination status.

## 2021-01-07 ENCOUNTER — Encounter: Payer: Self-pay | Admitting: Urgent Care

## 2021-01-07 ENCOUNTER — Encounter: Payer: Self-pay | Admitting: *Deleted

## 2021-01-07 ENCOUNTER — Other Ambulatory Visit: Payer: Self-pay

## 2021-01-07 ENCOUNTER — Emergency Department
Admission: EM | Admit: 2021-01-07 | Discharge: 2021-01-08 | Disposition: A | Discharge status: Home / Self Care | Payer: Medicare Other | Attending: Emergency Medicine | Admitting: Emergency Medicine

## 2021-01-07 ENCOUNTER — Other Ambulatory Visit
Admission: RE | Admit: 2021-01-07 | Discharge: 2021-01-07 | Disposition: A | Discharge status: Home / Self Care | Payer: Medicare Other | Source: Ambulatory Visit | Attending: Podiatry | Admitting: Podiatry

## 2021-01-07 DIAGNOSIS — I2699 Other pulmonary embolism without acute cor pulmonale: Secondary | ICD-10-CM | POA: Diagnosis not present

## 2021-01-07 DIAGNOSIS — Z79899 Other long term (current) drug therapy: Secondary | ICD-10-CM | POA: Insufficient documentation

## 2021-01-07 DIAGNOSIS — M1712 Unilateral primary osteoarthritis, left knee: Secondary | ICD-10-CM | POA: Diagnosis not present

## 2021-01-07 DIAGNOSIS — M79609 Pain in unspecified limb: Secondary | ICD-10-CM | POA: Diagnosis present

## 2021-01-07 DIAGNOSIS — F1290 Cannabis use, unspecified, uncomplicated: Secondary | ICD-10-CM | POA: Insufficient documentation

## 2021-01-07 DIAGNOSIS — I10 Essential (primary) hypertension: Secondary | ICD-10-CM | POA: Insufficient documentation

## 2021-01-07 DIAGNOSIS — Z96652 Presence of left artificial knee joint: Secondary | ICD-10-CM | POA: Insufficient documentation

## 2021-01-07 DIAGNOSIS — Z046 Encounter for general psychiatric examination, requested by authority: Secondary | ICD-10-CM | POA: Diagnosis present

## 2021-01-07 DIAGNOSIS — F0390 Unspecified dementia without behavioral disturbance: Secondary | ICD-10-CM | POA: Diagnosis not present

## 2021-01-07 DIAGNOSIS — Z01818 Encounter for other preprocedural examination: Secondary | ICD-10-CM | POA: Insufficient documentation

## 2021-01-07 DIAGNOSIS — F32A Depression, unspecified: Secondary | ICD-10-CM | POA: Insufficient documentation

## 2021-01-07 DIAGNOSIS — F29 Unspecified psychosis not due to a substance or known physiological condition: Secondary | ICD-10-CM | POA: Insufficient documentation

## 2021-01-07 DIAGNOSIS — F419 Anxiety disorder, unspecified: Secondary | ICD-10-CM

## 2021-01-07 DIAGNOSIS — Y903 Blood alcohol level of 60-79 mg/100 ml: Secondary | ICD-10-CM | POA: Diagnosis not present

## 2021-01-07 DIAGNOSIS — R456 Violent behavior: Secondary | ICD-10-CM | POA: Insufficient documentation

## 2021-01-07 DIAGNOSIS — R41844 Frontal lobe and executive function deficit: Secondary | ICD-10-CM | POA: Diagnosis not present

## 2021-01-07 DIAGNOSIS — R4689 Other symptoms and signs involving appearance and behavior: Secondary | ICD-10-CM

## 2021-01-07 LAB — BASIC METABOLIC PANEL
Anion gap: 9 (ref 5–15)
BUN: 22 mg/dL (ref 8–23)
CO2: 25 mmol/L (ref 22–32)
Calcium: 9.2 mg/dL (ref 8.9–10.3)
Chloride: 103 mmol/L (ref 98–111)
Creatinine, Ser: 0.88 mg/dL (ref 0.61–1.24)
GFR, Estimated: >60 mL/min (ref >60)
Glucose, Bld: 89 mg/dL (ref 70–99)
Potassium: 3.7 mmol/L (ref 3.5–5.1)
Sodium: 137 mmol/L (ref 135–145)

## 2021-01-07 LAB — COMPREHENSIVE METABOLIC PANEL
ALT: 33 U/L (ref 0–44)
AST: 33 U/L (ref 15–41)
Albumin: 4.8 g/dL (ref 3.5–5.0)
Alkaline Phosphatase: 77 U/L (ref 38–126)
Anion gap: 10 (ref 5–15)
BUN: 19 mg/dL (ref 8–23)
CO2: 22 mmol/L (ref 22–32)
Calcium: 9.2 mg/dL (ref 8.9–10.3)
Chloride: 105 mmol/L (ref 98–111)
Creatinine, Ser: 1.18 mg/dL (ref 0.61–1.24)
GFR, Estimated: >60 mL/min (ref >60)
Glucose, Bld: 108 mg/dL — ABNORMAL HIGH (ref 70–99)
Potassium: 3.8 mmol/L (ref 3.5–5.1)
Sodium: 137 mmol/L (ref 135–145)
Total Bilirubin: 0.7 mg/dL (ref 0.3–1.2)
Total Protein: 8.2 g/dL — ABNORMAL HIGH (ref 6.5–8.1)

## 2021-01-07 LAB — CBC
HCT: 45.3 % (ref 39.0–52.0)
HCT: 46.8 % (ref 39.0–52.0)
Hemoglobin: 15 g/dL (ref 13.0–17.0)
Hemoglobin: 15.8 g/dL (ref 13.0–17.0)
MCH: 29 pg (ref 26.0–34.0)
MCH: 30.1 pg (ref 26.0–34.0)
MCHC: 33.1 g/dL (ref 30.0–36.0)
MCHC: 33.8 g/dL (ref 30.0–36.0)
MCV: 87.5 fL (ref 80.0–100.0)
MCV: 89.1 fL (ref 80.0–100.0)
Platelets: 311 10*3/uL (ref 150–400)
Platelets: 308 10*3/uL (ref 150–400)
RBC: 5.18 MIL/uL (ref 4.22–5.81)
RBC: 5.25 MIL/uL (ref 4.22–5.81)
RDW: 14.2 % (ref 11.5–15.5)
RDW: 14.3 % (ref 11.5–15.5)
WBC: 5.9 10*3/uL (ref 4.0–10.5)
WBC: 9 10*3/uL (ref 4.0–10.5)
nRBC: 0 % (ref 0.0–0.2)
nRBC: 0 % (ref 0.0–0.2)

## 2021-01-07 NOTE — ED Provider Notes (Signed)
[facility] Emergency Department Provider Note   ____________________________________________   Event Date/Time   First MD Initiated Contact with Patient 01/07/21 2358     (approximate)  I have reviewed the triage vital signs and the nursing notes.   HISTORY  Chief Complaint Psychiatric Evaluation    HPI Vincent Cole is a 70 y.o. male brought to the ED under IVC by [employer] for aggressive behavior.  History of dementia, anxiety and depression.  Patient denies active SI/HI/AH/VH.  Voices no medical complaints.     Past Medical History:  Diagnosis Date   Arthritis    Benign bladder mass    Complication of anesthesia    PONV x 1 in 2010   Depression    Frequency of urination    GERD (gastroesophageal reflux disease) no meds   Heart murmur    was told many years ago   History of recurrent UTIs    Hypertension    IC (interstitial cystitis)    Nocturia    OSA on CPAP nightly   Pelvic pain in male    PONV (postoperative nausea and vomiting)    Simple renal cyst    Urgency of urination    Urinary hesitancy     Patient Active Problem List   Diagnosis Date Noted   PE (pulmonary thromboembolism) ([place]) 10/20/2017   Pain in limb 10/20/2017   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 Authorizing Provider  amLODipine-benazepril (LOTREL) 5-40 MG capsule Take 1  capsule by mouth in the morning. 12/10/20   [provider]  CINNAMON PO Take 1,000 mg by mouth in the morning.    [provider]  DULoxetine (CYMBALTA) 30 MG capsule Take 30 mg by mouth in the morning. 11/30/20   [provider]  eszopiclone (LUNESTA) 2 MG TABS tablet Take 2 mg by mouth at bedtime. 12/28/20   [provider]  fluticasone (FLONASE) 50 MCG/ACT nasal spray Place 1-2 sprays into both nostrils at bedtime. 12/28/20   [provider]  gabapentin (NEURONTIN) 300 MG capsule Take 300 mg by mouth 3 (three) times daily. 10/29/20   [provider]  hydrocortisone cream 1 % Apply 1 application topically 2 (two) times daily as needed (skin irritation/itching).    [provider]  meloxicam (MOBIC) 15 MG tablet Take 15 mg by mouth in the morning. 08/31/17   [provider]  Omega-3 Fatty Acids (OMEGA 3 PO) Take 1 capsule by mouth once a week.    [provider]  Vilazodone HCl (VIIBRYD STARTER PACK) 10 & 20 MG KIT Take 10-20 mg by mouth daily at 4 PM.    [provider]    Allergies Patient has no known allergies.  Family History  Problem Relation Age of Onset   Heart disease Mother     Social History Social History  Tobacco Use   Smoking status: Never   Smokeless tobacco: Never  Substance Use Topics   Alcohol use: Not Currently   Drug use: No    Review of Systems  Constitutional: No fever/chills Eyes: No visual changes. ENT: No sore throat. Cardiovascular: Denies chest pain. Respiratory: Denies shortness of breath. Gastrointestinal: No abdominal pain.  No nausea, no vomiting.  No diarrhea.  No constipation. Genitourinary: Negative for dysuria. Musculoskeletal: Negative for back pain. Skin: Negative for rash. Neurological: Negative for headaches, focal weakness or numbness. Psychiatric: Positive for aggression.  ____________________________________________   PHYSICAL EXAM:  VITAL  SIGNS: ED Triage Vitals  Enc Vitals Group     BP 01/07/21 2322 (!) 167/110     Pulse Rate 01/07/21 2322 (!) 115     Resp 01/07/21 2322 16     Temp 01/07/21 2324 99.1 F (37.3 C)     Temp Source 01/07/21 2324 Oral     SpO2 01/07/21 2322 96 %     Weight --      Height --      Head Circumference --      Peak Flow --      Pain Score 01/07/21 2323 4     Pain Loc --      Pain Edu? --      Excl. in [place]? --     Constitutional: Alert and oriented. Well appearing and in no acute distress. Eyes: Conjunctivae are normal. PERRL. EOMI. Head: Atraumatic. Nose: No congestion/rhinnorhea. Mouth/Throat: Mucous membranes are moist.   Neck: No stridor.   Cardiovascular: Normal rate, regular rhythm. Grossly normal heart sounds.  Good peripheral circulation. Respiratory: Normal respiratory effort.  No retractions. Lungs CTAB. Gastrointestinal: Soft and nontender. No distention. No abdominal bruits. No CVA tenderness. Musculoskeletal: No lower extremity tenderness nor edema.  No joint effusions. Neurologic:  Normal speech and language. No gross focal neurologic deficits are appreciated. No gait instability. Skin:  Skin is warm, dry and intact. No rash noted. Psychiatric: Mood and affect are normal. Speech and behavior are normal.  ____________________________________________   LABS (all labs ordered are listed, but only abnormal results are displayed)  Labs Reviewed  COMPREHENSIVE METABOLIC PANEL - Abnormal; Notable for the following components:      Result Value   Glucose, Bld 108 (*)    Total Protein 8.2 (*)    All other components within normal limits  ETHANOL - Abnormal; Notable for the following components:   Alcohol, Ethyl (B) 64 (*)    All other components within normal limits  URINE DRUG SCREEN, QUALITATIVE ([facility] ONLY) - Abnormal; Notable for the following components:   Cannabinoid 50 Ng, Ur [place] POSITIVE (*)    All other components within normal limits  ACETAMINOPHEN LEVEL -  Abnormal; Notable for the following components:   Acetaminophen (Tylenol), Serum <10 (*)    All other components within normal limits  SALICYLATE LEVEL - Abnormal; Notable for the following components:   Salicylate Lvl <1.6 (*)    All other components within normal limits  CBC  TROPONIN I (HIGH SENSITIVITY)   ____________________________________________  EKG  ED ECG REPORT I, Prinston Kynard J, the attending physician, personally viewed and interpreted this ECG.   Date: 01/08/2021  EKG Time: 0157  Rate: 108  Rhythm: sinus tachycardia  Axis: Normal  Intervals:none  ST&T Change: Nonspecific  ____________________________________________  RADIOLOGY I, Jakeob Tullis J, personally viewed and evaluated these images (plain radiographs) as part of my medical decision making, as well as reviewing the written report  by the radiologist.  ED MD interpretation: None  Official radiology report(s): No results found.  ____________________________________________   PROCEDURES  Procedure(s) performed (including Critical Care):  Procedures   ____________________________________________   INITIAL IMPRESSION / ASSESSMENT AND PLAN / ED COURSE  As part of my medical decision making, I reviewed the following data within the [place] notes reviewed and incorporated, Labs reviewed, EKG interpreted, Old chart reviewed, A consult was requested and obtained from this/these consultant(s) Psychiatry, and Notes from prior ED visits     70 year old male brought to the ED under IVC for aggressive behavior. The patient has been placed in psychiatric observation due to the need to provide a safe environment for the patient while obtaining psychiatric consultation and evaluation, as well as ongoing medical and medication management to treat the patient's condition.  The patient has been placed under full IVC at this time.    Clinical Course as of 01/08/21 8546  Fri Jan 08, 2021   0604 Patient seen by psychiatric NP overnight; plan for psych reassessment this morning. [JS]    Clinical Course User Index [JS] Paulette Blanch, MD     ____________________________________________   FINAL CLINICAL IMPRESSION(S) / ED DIAGNOSES  Final diagnoses:  Aggressive behavior  Anxiety  Depression, unspecified depression type  Marijuana use     ED Discharge Orders     None        Note:  This document was prepared using Dragon voice recognition software and may include unintentional dictation errors.    Paulette Blanch, MD 01/08/21 [phone]
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
## 2021-01-08 DIAGNOSIS — R41844 Frontal lobe and executive function deficit: Secondary | ICD-10-CM

## 2021-01-08 LAB — URINE DRUG SCREEN, QUALITATIVE (ARMC ONLY)
Amphetamines, Ur Screen: NOT DETECTED
Barbiturates, Ur Screen: NOT DETECTED
Benzodiazepine, Ur Scrn: NOT DETECTED
Cannabinoid 50 Ng, Ur ~~LOC~~: POSITIVE — AB
Cocaine Metabolite,Ur ~~LOC~~: NOT DETECTED
MDMA (Ecstasy)Ur Screen: NOT DETECTED
Methadone Scn, Ur: NOT DETECTED
Opiate, Ur Screen: NOT DETECTED
Phencyclidine (PCP) Ur S: NOT DETECTED
Tricyclic, Ur Screen: NOT DETECTED

## 2021-01-08 LAB — ACETAMINOPHEN LEVEL: Acetaminophen (Tylenol), Serum: <10 ug/mL — ABNORMAL LOW (ref 10–30)

## 2021-01-08 LAB — SALICYLATE LEVEL: Salicylate Lvl: <7 mg/dL — ABNORMAL LOW (ref 7.0–30.0)

## 2021-01-08 LAB — ETHANOL: Alcohol, Ethyl (B): 64 mg/dL — ABNORMAL HIGH (ref <10)

## 2021-01-08 LAB — TROPONIN I (HIGH SENSITIVITY): Troponin I (High Sensitivity): 6 ng/L (ref <18)

## 2021-01-08 MED ORDER — BENAZEPRIL HCL 20 MG PO TABS: 40.0000 mg | ORAL_TABLET | Freq: Every day | ORAL | Status: DC

## 2021-01-08 MED ORDER — DULOXETINE HCL 30 MG PO CPEP: 30.0000 mg | ORAL_CAPSULE | Freq: Every morning | ORAL | Status: DC

## 2021-01-08 MED ORDER — RISPERIDONE 0.25 MG PO TABS: 0.2500 mg | ORAL_TABLET | Freq: Two times a day (BID) | ORAL | 0 refills | Status: DC

## 2021-01-08 MED ORDER — AMLODIPINE BESYLATE 5 MG PO TABS: 5.0000 mg | ORAL_TABLET | Freq: Every day | ORAL | Status: DC

## 2021-01-08 MED ORDER — RISPERIDONE 0.25 MG PO TABS
0.2500 mg | ORAL_TABLET | Freq: Two times a day (BID) | ORAL | Status: DC
  Filled 2021-01-08 (×2): qty 1

## 2021-01-08 MED ORDER — GABAPENTIN 300 MG PO CAPS: 300.0000 mg | ORAL_CAPSULE | Freq: Three times a day (TID) | ORAL | Status: DC

## 2021-01-08 NOTE — ED Notes (Signed)
Pt son here to pick up patient. E-signature not working at this time. Pt and son verbalized understanding of D/C instructions, prescriptions and follow up care with no further questions at this time. Pt in NAD and ambulatory at time of D/C. Belongings bag 1/1 returned to patient.
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
## 2021-01-08 NOTE — Consult Note (Signed)
Client denies suicidal/homicidal ideations, hallucinations, and substance abuse.  IVC rescinded, client placed for discharged this morning by evening providers.  Waylan Boga, PMHNP
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
## 2021-01-08 NOTE — ED Notes (Signed)
IVC, pending consult

## 2021-01-08 NOTE — ED Notes (Signed)
Pt wishing to take medications when he gets home.

## 2021-01-08 NOTE — Discharge Instructions (Signed)
Follow up with outpatient services

## 2021-01-15 ENCOUNTER — Encounter
Admission: RE | Disposition: A | Discharge status: Home / Self Care | Payer: Self-pay | Source: Home / Self Care | Attending: Podiatry

## 2021-01-15 ENCOUNTER — Ambulatory Visit: Payer: Medicare Other | Admitting: Anesthesiology

## 2021-01-15 ENCOUNTER — Ambulatory Visit
Admission: RE | Admit: 2021-01-15 | Discharge: 2021-01-15 | Disposition: A | Discharge status: Home / Self Care | Payer: Medicare Other | Attending: Podiatry | Admitting: Podiatry

## 2021-01-15 ENCOUNTER — Encounter: Payer: Self-pay | Admitting: Podiatry

## 2021-01-15 ENCOUNTER — Other Ambulatory Visit: Payer: Self-pay

## 2021-01-15 ENCOUNTER — Ambulatory Visit: Payer: Medicare Other

## 2021-01-15 DIAGNOSIS — M2011 Hallux valgus (acquired), right foot: Secondary | ICD-10-CM | POA: Insufficient documentation

## 2021-01-15 DIAGNOSIS — Z79899 Other long term (current) drug therapy: Secondary | ICD-10-CM | POA: Insufficient documentation

## 2021-01-15 DIAGNOSIS — M2041 Other hammer toe(s) (acquired), right foot: Secondary | ICD-10-CM | POA: Diagnosis not present

## 2021-01-15 DIAGNOSIS — Z7901 Long term (current) use of anticoagulants: Secondary | ICD-10-CM | POA: Insufficient documentation

## 2021-01-15 DIAGNOSIS — Z96612 Presence of left artificial shoulder joint: Secondary | ICD-10-CM | POA: Insufficient documentation

## 2021-01-15 DIAGNOSIS — Z96652 Presence of left artificial knee joint: Secondary | ICD-10-CM | POA: Diagnosis not present

## 2021-01-15 HISTORY — PX: BUNIONECTOMY: SHX129

## 2021-01-15 HISTORY — PX: WEIL OSTEOTOMY: SHX5044

## 2021-01-15 HISTORY — PX: HAMMER TOE SURGERY: SHX385

## 2021-01-15 HISTORY — PX: CORRECTION OVERLAPPING TOES: SHX6615

## 2021-01-15 SURGERY — BUNIONECTOMY
Anesthesia: General | Site: Toe | Laterality: Right

## 2021-01-15 MED ORDER — BUPIVACAINE HCL (PF) 0.25 % IJ SOLN
INTRAMUSCULAR | Status: AC
  Filled 2021-01-15: qty 30

## 2021-01-15 MED ORDER — ONDANSETRON HCL 4 MG/2ML IJ SOLN
INTRAMUSCULAR | Status: AC
  Filled 2021-01-15: qty 2

## 2021-01-15 MED ORDER — APREPITANT 40 MG PO CAPS
ORAL_CAPSULE | ORAL | Status: AC
  Administered 2021-01-15: 40 mg via ORAL
  Filled 2021-01-15: qty 1

## 2021-01-15 MED ORDER — FAMOTIDINE 20 MG PO TABS: 20.0000 mg | ORAL_TABLET | Freq: Once | ORAL | Status: AC

## 2021-01-15 MED ORDER — ACETAMINOPHEN 10 MG/ML IV SOLN
INTRAVENOUS | Status: AC
  Filled 2021-01-15: qty 100

## 2021-01-15 MED ORDER — BUPIVACAINE LIPOSOME 1.3 % IJ SUSP
INTRAMUSCULAR | Status: DC | PRN
  Administered 2021-01-15: 15 mL

## 2021-01-15 MED ORDER — APIXABAN 2.5 MG PO TABS: 2.5000 mg | ORAL_TABLET | Freq: Two times a day (BID) | ORAL | 0 refills | Status: DC

## 2021-01-15 MED ORDER — LIDOCAINE HCL (CARDIAC) PF 100 MG/5ML IV SOSY
PREFILLED_SYRINGE | INTRAVENOUS | Status: DC | PRN
  Administered 2021-01-15: 100 mg via INTRAVENOUS

## 2021-01-15 MED ORDER — LACTATED RINGERS IV SOLN: INTRAVENOUS | Status: DC

## 2021-01-15 MED ORDER — APREPITANT 40 MG PO CAPS: 40.0000 mg | ORAL_CAPSULE | Freq: Once | ORAL | Status: AC

## 2021-01-15 MED ORDER — EPHEDRINE 5 MG/ML INJ
INTRAVENOUS | Status: AC
  Filled 2021-01-15: qty 5

## 2021-01-15 MED ORDER — FENTANYL CITRATE (PF) 100 MCG/2ML IJ SOLN
INTRAMUSCULAR | Status: AC
  Filled 2021-01-15: qty 2

## 2021-01-15 MED ORDER — 0.9 % SODIUM CHLORIDE (POUR BTL) OPTIME
TOPICAL | Status: DC | PRN
  Administered 2021-01-15: 1000 mL

## 2021-01-15 MED ORDER — CEFAZOLIN SODIUM-DEXTROSE 2-4 GM/100ML-% IV SOLN
2.0000 g | INTRAVENOUS | Status: AC
  Administered 2021-01-15: 2 g via INTRAVENOUS

## 2021-01-15 MED ORDER — BUPIVACAINE HCL (PF) 0.5 % IJ SOLN
INTRAMUSCULAR | Status: AC
  Filled 2021-01-15: qty 30

## 2021-01-15 MED ORDER — SUGAMMADEX SODIUM 200 MG/2ML IV SOLN
INTRAVENOUS | Status: DC | PRN
  Administered 2021-01-15: 160 mg via INTRAVENOUS

## 2021-01-15 MED ORDER — PHENYLEPHRINE HCL (PRESSORS) 10 MG/ML IV SOLN
INTRAVENOUS | Status: DC | PRN
  Administered 2021-01-15 (×4): 100 ug via INTRAVENOUS

## 2021-01-15 MED ORDER — OXYCODONE HCL 5 MG PO TABS
ORAL_TABLET | ORAL | Status: AC
  Filled 2021-01-15: qty 1

## 2021-01-15 MED ORDER — DEXAMETHASONE SODIUM PHOSPHATE 10 MG/ML IJ SOLN
INTRAMUSCULAR | Status: DC | PRN
  Administered 2021-01-15: 4 mg via INTRAVENOUS

## 2021-01-15 MED ORDER — MIDAZOLAM HCL 2 MG/2ML IJ SOLN
INTRAMUSCULAR | Status: DC | PRN
  Administered 2021-01-15: 1 mg via INTRAVENOUS

## 2021-01-15 MED ORDER — ACETAMINOPHEN 10 MG/ML IV SOLN
INTRAVENOUS | Status: DC | PRN
  Administered 2021-01-15: 1000 mg via INTRAVENOUS

## 2021-01-15 MED ORDER — LIDOCAINE HCL (PF) 2 % IJ SOLN
INTRAMUSCULAR | Status: AC
  Filled 2021-01-15: qty 5

## 2021-01-15 MED ORDER — EPHEDRINE SULFATE 50 MG/ML IJ SOLN
INTRAMUSCULAR | Status: DC | PRN
  Administered 2021-01-15 (×3): 5 mg via INTRAVENOUS
  Administered 2021-01-15: 10 mg via INTRAVENOUS

## 2021-01-15 MED ORDER — OXYCODONE-ACETAMINOPHEN 5-325 MG PO TABS: 1.0000 | ORAL_TABLET | Freq: Four times a day (QID) | ORAL | 0 refills | Status: DC | PRN

## 2021-01-15 MED ORDER — MIDAZOLAM HCL 2 MG/2ML IJ SOLN
INTRAMUSCULAR | Status: AC
  Filled 2021-01-15: qty 2

## 2021-01-15 MED ORDER — FAMOTIDINE 20 MG PO TABS
ORAL_TABLET | ORAL | Status: AC
  Administered 2021-01-15: 20 mg via ORAL
  Filled 2021-01-15: qty 1

## 2021-01-15 MED ORDER — ONDANSETRON HCL 4 MG/2ML IJ SOLN
INTRAMUSCULAR | Status: DC | PRN
  Administered 2021-01-15: 4 mg via INTRAVENOUS

## 2021-01-15 MED ORDER — ROCURONIUM BROMIDE 100 MG/10ML IV SOLN
INTRAVENOUS | Status: DC | PRN
  Administered 2021-01-15: 10 mg via INTRAVENOUS
  Administered 2021-01-15: 50 mg via INTRAVENOUS
  Administered 2021-01-15: 10 mg via INTRAVENOUS

## 2021-01-15 MED ORDER — BUPIVACAINE LIPOSOME 1.3 % IJ SUSP
INTRAMUSCULAR | Status: DC | PRN
  Administered 2021-01-15: 10 mL

## 2021-01-15 MED ORDER — OXYCODONE HCL 5 MG/5ML PO SOLN: 5.0000 mg | Freq: Once | ORAL | Status: AC | PRN

## 2021-01-15 MED ORDER — FENTANYL CITRATE (PF) 100 MCG/2ML IJ SOLN
25.0000 ug | INTRAMUSCULAR | Status: DC | PRN
  Administered 2021-01-15: 50 ug via INTRAVENOUS
  Administered 2021-01-15 (×2): 25 ug via INTRAVENOUS

## 2021-01-15 MED ORDER — PROPOFOL 10 MG/ML IV BOLUS
INTRAVENOUS | Status: DC | PRN
  Administered 2021-01-15: 120 mg via INTRAVENOUS

## 2021-01-15 MED ORDER — POVIDONE-IODINE 7.5 % EX SOLN
Freq: Once | CUTANEOUS | Status: DC
  Filled 2021-01-15: qty 118

## 2021-01-15 MED ORDER — FENTANYL CITRATE (PF) 100 MCG/2ML IJ SOLN
INTRAMUSCULAR | Status: DC | PRN
  Administered 2021-01-15 (×2): 50 ug via INTRAVENOUS

## 2021-01-15 MED ORDER — PROMETHAZINE HCL 25 MG/ML IJ SOLN: 6.2500 mg | INTRAMUSCULAR | Status: DC | PRN

## 2021-01-15 MED ORDER — MEPERIDINE HCL 25 MG/ML IJ SOLN: 6.2500 mg | INTRAMUSCULAR | Status: DC | PRN

## 2021-01-15 MED ORDER — CEFAZOLIN SODIUM-DEXTROSE 2-4 GM/100ML-% IV SOLN
INTRAVENOUS | Status: AC
  Filled 2021-01-15: qty 100

## 2021-01-15 MED ORDER — CHLORHEXIDINE GLUCONATE 0.12 % MT SOLN: 15.0000 mL | Freq: Once | OROMUCOSAL | Status: AC

## 2021-01-15 MED ORDER — BUPIVACAINE LIPOSOME 1.3 % IJ SUSP
INTRAMUSCULAR | Status: AC
  Filled 2021-01-15: qty 20

## 2021-01-15 MED ORDER — OXYCODONE HCL 5 MG PO TABS
5.0000 mg | ORAL_TABLET | Freq: Once | ORAL | Status: AC | PRN
Start: 2021-01-15 — End: 2021-01-15
  Administered 2021-01-15: 5 mg via ORAL

## 2021-01-15 MED ORDER — LIDOCAINE HCL (PF) 1 % IJ SOLN
INTRAMUSCULAR | Status: AC
  Filled 2021-01-15: qty 30

## 2021-01-15 MED ORDER — PHENYLEPHRINE HCL (PRESSORS) 10 MG/ML IV SOLN
INTRAVENOUS | Status: AC
  Filled 2021-01-15: qty 1

## 2021-01-15 MED ORDER — PROPOFOL 10 MG/ML IV BOLUS
INTRAVENOUS | Status: AC
  Filled 2021-01-15: qty 20

## 2021-01-15 MED ORDER — CHLORHEXIDINE GLUCONATE 0.12 % MT SOLN
OROMUCOSAL | Status: AC
  Administered 2021-01-15: 15 mL via OROMUCOSAL
  Filled 2021-01-15: qty 15

## 2021-01-15 MED ORDER — ORAL CARE MOUTH RINSE: 15.0000 mL | Freq: Once | OROMUCOSAL | Status: AC

## 2021-01-15 MED ORDER — SODIUM CHLORIDE 0.9 % IV SOLN
INTRAVENOUS | Status: DC | PRN
  Administered 2021-01-15: 18 mL/h via INTRAVENOUS

## 2021-01-15 SURGICAL SUPPLY — 87 items
APL SKNCLS STERI-STRIP NONHPOA (GAUZE/BANDAGES/DRESSINGS) ×2
BENZOIN TINCTURE PRP APPL 2/3 (GAUZE/BANDAGES/DRESSINGS) ×3 IMPLANT
BIT DRILL 1.5MM 95MM JCC (BIT) ×2 IMPLANT
BIT DRILL 100X2XQC STRL (BIT) ×2 IMPLANT
BIT DRILL QC 2.0X100 (BIT) ×3
BIT DRL 100X2XQC STRL (BIT) ×2
BLADE MED AGGRESSIVE (BLADE) IMPLANT
BLADE OSC/SAGITTAL 5.5X25 (BLADE) ×3 IMPLANT
BLADE OSC/SAGITTAL MD 5.5X18 (BLADE) ×3 IMPLANT
BLADE SAW LAPIPLASTY 40X11 (BLADE) ×3 IMPLANT
BLADE SURG 15 STRL LF DISP TIS (BLADE) ×4 IMPLANT
BLADE SURG 15 STRL SS (BLADE) ×6
BNDG CMPR 75X41 PLY HI ABS (GAUZE/BANDAGES/DRESSINGS) ×2
BNDG CMPR STD VLCR NS LF 5.8X4 (GAUZE/BANDAGES/DRESSINGS) ×6
BNDG COHESIVE 4X5 TAN ST LF (GAUZE/BANDAGES/DRESSINGS) ×6 IMPLANT
BNDG CONFORM 3 STRL LF (GAUZE/BANDAGES/DRESSINGS) IMPLANT
BNDG ELASTIC 4X5.8 VLCR NS LF (GAUZE/BANDAGES/DRESSINGS) ×9 IMPLANT
BNDG ESMARK 4X12 TAN STRL LF (GAUZE/BANDAGES/DRESSINGS) ×3 IMPLANT
BNDG GAUZE ELAST 4 BULKY (GAUZE/BANDAGES/DRESSINGS) ×6 IMPLANT
BNDG STRETCH 4X75 STRL LF (GAUZE/BANDAGES/DRESSINGS) ×3 IMPLANT
BOOT STEPPER DURA MED (SOFTGOODS) ×3 IMPLANT
C-WIRE 0.45" SPADE ×3 IMPLANT
CANISTER SUCT 1200ML W/VALVE (MISCELLANEOUS) IMPLANT
COVER LIGHT HANDLE STERIS (MISCELLANEOUS) ×6 IMPLANT
CUFF TOURN SGL QUICK 12 (TOURNIQUET CUFF) IMPLANT
CUFF TOURN SGL QUICK 18X4 (TOURNIQUET CUFF) ×3 IMPLANT
DRAPE FLUOR MINI C-ARM 54X84 (DRAPES) ×3 IMPLANT
DRILL BIT QC 1.5MM (BIT) ×1
DURAPREP 26ML APPLICATOR (WOUND CARE) ×3 IMPLANT
ELECT REM PT RETURN 9FT ADLT (ELECTROSURGICAL) ×3
ELECTRODE REM PT RTRN 9FT ADLT (ELECTROSURGICAL) ×2 IMPLANT
GAUZE 4X4 16PLY ~~LOC~~+RFID DBL (SPONGE) ×3 IMPLANT
GAUZE SPONGE 4X4 12PLY STRL (GAUZE/BANDAGES/DRESSINGS) ×6 IMPLANT
GAUZE XEROFORM 1X8 LF (GAUZE/BANDAGES/DRESSINGS) IMPLANT
GLOVE SURG ENC MOIS LTX SZ7.5 (GLOVE) ×3 IMPLANT
GLOVE SURG UNDER LTX SZ8 (GLOVE) ×3 IMPLANT
GOWN STRL REUS W/ TWL XL LVL3 (GOWN DISPOSABLE) ×4 IMPLANT
GOWN STRL REUS W/TWL XL LVL3 (GOWN DISPOSABLE) ×6
K-WIRE DBL END TROCAR 6X.045 (WIRE)
K-WIRE DBL END TROCAR 6X.062 (WIRE)
KIT TURNOVER KIT A (KITS) ×3 IMPLANT
KWIRE DBL END TROCAR 6X.045 (WIRE) IMPLANT
KWIRE DBL END TROCAR 6X.062 (WIRE) IMPLANT
LABEL OR SOLS (LABEL) IMPLANT
LAPIPLASTY SYS 4A (Orthopedic Implant) ×3 IMPLANT
MANIFOLD NEPTUNE II (INSTRUMENTS) ×3 IMPLANT
NDL SAFETY ECLIPSE 18X1.5 (NEEDLE) ×2 IMPLANT
NEEDLE HYPO 18GX1.5 SHARP (NEEDLE) ×3
NEEDLE HYPO 22GX1.5 SAFETY (NEEDLE) ×3 IMPLANT
NEEDLE HYPO 25X1 1.5 SAFETY (NEEDLE) ×9 IMPLANT
NS IRRIG 1000ML POUR BTL (IV SOLUTION) ×3 IMPLANT
NS IRRIG 500ML POUR BTL (IV SOLUTION) IMPLANT
PACK EXTREMITY ARMC (MISCELLANEOUS) ×3 IMPLANT
PENCIL ELECTRO HAND CTR (MISCELLANEOUS) IMPLANT
PIN BALLS 3/8 F/.045 WIRE (MISCELLANEOUS) ×3 IMPLANT
RASP SM TEAR CROSS CUT (RASP) ×3 IMPLANT
SCREW 2.7 HIGH PITCH LOCKING (Screw) ×3 IMPLANT
SCREW CORTEX 2.0 8MM (Screw) ×1 IMPLANT
SCREW CORTEX 2.7X14MM (Screw) ×3 IMPLANT
SCREW HIGH PITCH LOCK 2.7 (Screw) ×3 IMPLANT
STOCKINETTE IMPERV 14X48 (MISCELLANEOUS) ×3 IMPLANT
STRAP SAFETY 5IN WIDE (MISCELLANEOUS) ×3 IMPLANT
STRIP CLOSURE SKIN 1/4X4 (GAUZE/BANDAGES/DRESSINGS) ×3 IMPLANT
SUT ETHILON 4-0 (SUTURE)
SUT ETHILON 4-0 FS2 18XMFL BLK (SUTURE)
SUT ETHILON 5-0 FS-2 18 BLK (SUTURE) IMPLANT
SUT MNCRL 4-0 (SUTURE) ×6
SUT MNCRL 4-0 27XMFL (SUTURE) ×4
SUT MNCRL 5-0+ PC-1 (SUTURE) IMPLANT
SUT MONOCRYL 5-0 (SUTURE)
SUT VIC AB 2-0 SH 27 (SUTURE)
SUT VIC AB 2-0 SH 27XBRD (SUTURE) IMPLANT
SUT VIC AB 3-0 SH 27 (SUTURE) ×6
SUT VIC AB 3-0 SH 27X BRD (SUTURE) ×4 IMPLANT
SUT VIC AB 4-0 FS2 27 (SUTURE) IMPLANT
SUT VIC AB 4-0 SH 27 (SUTURE) ×3
SUT VIC AB 4-0 SH 27XANBCTRL (SUTURE) ×2 IMPLANT
SUT VICRYL AB 3-0 FS1 BRD 27IN (SUTURE) IMPLANT
SUTURE ETHLN 4-0 FS2 18XMF BLK (SUTURE) IMPLANT
SUTURE MNCRL 4-0 27XMF (SUTURE) ×4 IMPLANT
SYR 10ML LL (SYRINGE) ×6 IMPLANT
SYR 50ML LL SCALE MARK (SYRINGE) ×3 IMPLANT
SYSTEM LAPIPLASTY 4A (Orthopedic Implant) ×2 IMPLANT
WATER STERILE IRR 500ML POUR (IV SOLUTION) ×3 IMPLANT
WIRE Z .045 C-WIRE SPADE TIP (WIRE) ×3 IMPLANT
WIRE Z .062 C-WIRE SPADE TIP (WIRE) IMPLANT
rev b saw blade ×3 IMPLANT

## 2021-01-15 NOTE — Op Note (Signed)
Operative note   Surgeon:Thelia Tanksley Lawyer: None    Preop diagnosis: 1.Hallux valgus deformity right foot 2.  Hammertoe contracture right second toe 3.  Dislocation right second MTPJ    Postop diagnosis: Same    Procedure: Lapidus hallux valgus correction right foot 2.  PIPJ arthrodesis right second toe 3.  Weil osteotomy right second metatarsal    EBL: Minimal    Anesthesia:local and general    Hemostasis: Mid calf tourniquet inflated to 250 mmHg for 120-minute    Specimen: None    Complications: None    Operative indications:Dontrel Prokop is an 70 y.o. that presents today for surgical intervention.  The risks/benefits/alternatives/complications have been discussed and consent has been given.    Procedure:  Patient was brought into the OR and placed on the operating table in thesupine position. After anesthesia was obtained theright lower extremity was prepped and draped in usual sterile fashion.  Attention was directed to the dorsal aspect of the foot where a dorsal incision was made at the first met cuneiforms joint.  Sharp and blunt dissection was carried down to the periosteum.  Subperiosteal dissection was then undertaken.  This exposed the first met cuneiform joint.  This was then freed and loosened.  A small fulcrum was placed between the base of the first metatarsal and second metatarsal.  Next the joint positioner was placed on the medial aspect of the metatarsal.  A small stab incision was made at the second metatarsal.  Compression was placed for the positioner.  Good realignment of the first intermetatarsal angle was noted at this time.  Attention was then directed to the dorsomedial first MTPJ where an incision was performed.  Sharp and blunt dissection was carried down to the capsule.  The intermetatarsal space was then entered.  The conjoined tendon of the abductor was then freed from the base of the proximal phalanx.  Attention was to redirected to the first  met cuneiform joint.  At this time the osteotomy cut guide was placed into the joint region.  2 vertical cuts were then made.  The cartilage material was removed from the first met cuneiform joint and the joint was then prepped with a 2.0 mm drill bit.  The joint compressor was then placed.  Good compression and realignment was noted.  Next the medial and dorsal locking plates were then placed from the Selmer set.  Realignment and stability was noted in all planes.  Attention was redirected to the dorsomedial first MTPJ.  A small T capsulotomy was performed.  The dorsomedial eminence was noted and transected and smoothed with a power rasp.  A small capsulorrhaphy was performed.    Attention was then directed to the right second toe where a 70 linear incision was made from the MTPJ to the PIPJ.  Sharp and blunt dissection carried down to the long extensor tendon.  A tenotomy was created.  The tendon was reflected proximally.  The PIPJ was exposed and the head of the proximal phalanx and base of the middle phalanx were excised.  Attention was redirected further to the metatarsophalangeal joint.  The toe was completely disarticulated at the metatarsophalangeal joint.  Dorsomedial lateral capsulotomy was performed.  Next a Weil osteotomy was created from distal to proximal.  The capital fragment was brought back proximal.  The ensuing overhanging ledge was transected and removed.  The osteotomy was then stabilized with a 2.0 mm Synthes cortical screw.  Good stability and alignment was noted.  The  second toe was in a much straighter position.  Next a K wire was driven from the base of the middle phalanx through the tip of the toe and then retrograded back across the PIPJ crossing the MTPJ with the second toe in a much more stable aligned position.  Good alignment was noted with the foot in a simulated weightbearing position.  At this time layered closure was performed to all areas with 3-0 Vicryl for the deeper  and subcutaneous tissue and a 4-0 Monocryl for the skin.  A bulky sterile dressing was applied.  Patient was placed in an equalizer walker  Further local anesthesia was performed at the end of the case.    Patient tolerated the procedure and anesthesia well.  Was transported from the OR to the PACU with all vital signs stable and vascular status intact. To be discharged per routine protocol.  Will follow up in approximately 1 week in the outpatient clinic.

## 2021-01-15 NOTE — Anesthesia Procedure Notes (Signed)
Procedure Name: Intubation Date/Time: 01/15/2021 10:53 AM Performed by: Babs Sciara, CRNA Pre-anesthesia Checklist: Patient identified, Emergency Drugs available, Suction available, Patient being monitored and Timeout performed Patient Re-evaluated:Patient Re-evaluated prior to induction Oxygen Delivery Method: Circle system utilized Preoxygenation: Pre-oxygenation with 100% oxygen Induction Type: IV induction Ventilation: Mask ventilation without difficulty and Oral airway inserted - appropriate to patient size Laryngoscope Size: McGraph and 3 Grade View: Grade I Tube type: Oral Tube size: 7.0 mm Number of attempts: 1 Airway Equipment and Method: Stylet and Video-laryngoscopy Placement Confirmation: positive ETCO2 and breath sounds checked- equal and bilateral Secured at: 22 (lip) cm Tube secured with: Tape Dental Injury: Teeth and Oropharynx as per pre-operative assessment

## 2021-01-15 NOTE — Transfer of Care (Signed)
Immediate Anesthesia Transfer of Care Note  Patient: Vincent Cole  Procedure(s) Performed: Lillard Anes; LAPIDUS-TYPE (Right: Toe) WEIL (Right) HAMMER TOE CORRECTION (Right: Toe) CORRECTION OVERLAPPING TOES (Right)  Patient Location: PACU  Anesthesia Type:General  Level of Consciousness: awake, alert  and oriented  Airway & Oxygen Therapy: Patient Spontanous Breathing and Patient connected to face mask oxygen  Post-op Assessment: Report given to RN and Post -op Vital signs reviewed and stable  Post vital signs: Reviewed and stable  Last Vitals:  Vitals Value Taken Time  BP 119/84 01/15/21 1330  Temp    Pulse 77 01/15/21 1333  Resp 15 01/15/21 1333  SpO2 98 % 01/15/21 1333  Vitals shown include unvalidated device data.  Last Pain:  Vitals:   01/15/21 0922  TempSrc: Temporal  PainSc: 3          Complications: No notable events documented.

## 2021-01-15 NOTE — Anesthesia Preprocedure Evaluation (Signed)
Anesthesia Evaluation  Patient identified by MRN, date of birth, ID band Patient awake    Reviewed: Allergy & Precautions, NPO status , Patient's Chart, lab work & pertinent test results  History of Anesthesia Complications (+) PONV and history of anesthetic complications  Airway Mallampati: II  TM Distance: >3 FB Neck ROM: Full    Dental  (+) Implants   Pulmonary sleep apnea and Continuous Positive Airway Pressure Ventilation ,    breath sounds clear to auscultation- rhonchi (-) wheezing      Cardiovascular Exercise Tolerance: Good hypertension, Pt. on medications (-) CAD, (-) Past MI, (-) Cardiac Stents and (-) CABG  Rhythm:Regular Rate:Normal - Systolic murmurs and - Diastolic murmurs    Neuro/Psych neg Seizures PSYCHIATRIC DISORDERS Depression negative neurological ROS     GI/Hepatic Neg liver ROS, GERD  ,  Endo/Other  negative endocrine ROSneg diabetes  Renal/GU negative Renal ROS     Musculoskeletal  (+) Arthritis ,   Abdominal (+) + obese,   Peds  Hematology negative hematology ROS (+)   Anesthesia Other Findings Past Medical History: No date: Arthritis No date: Benign bladder mass No date: Complication of anesthesia     Comment:  PONV x 1 in 2010 No date: Depression No date: Frequency of urination no meds: GERD (gastroesophageal reflux disease) No date: Heart murmur     Comment:  was told many years ago No date: History of recurrent UTIs No date: Hypertension No date: IC (interstitial cystitis) No date: Nocturia nightly: OSA on CPAP No date: Pelvic pain in male No date: PONV (postoperative nausea and vomiting) No date: Simple renal cyst No date: Urgency of urination No date: Urinary hesitancy   Reproductive/Obstetrics                             Anesthesia Physical Anesthesia Plan  ASA: 3  Anesthesia Plan: General   Post-op Pain Management:    Induction:  Intravenous  PONV Risk Score and Plan: 2 and Ondansetron, Dexamethasone and Aprepitant  Airway Management Planned: LMA  Additional Equipment:   Intra-op Plan:   Post-operative Plan:   Informed Consent: I have reviewed the patients History and Physical, chart, labs and discussed the procedure including the risks, benefits and alternatives for the proposed anesthesia with the patient or authorized representative who has indicated his/her understanding and acceptance.     Dental advisory given  Plan Discussed with: CRNA and Anesthesiologist  Anesthesia Plan Comments:         Anesthesia Quick Evaluation

## 2021-01-15 NOTE — H&P (Signed)
HISTORY AND PHYSICAL INTERVAL NOTE:  01/15/2021  10:35 AM  Vincent Cole  has presented today for surgery, with the diagnosis of M20.11- Hallux valgus, right M20.41- Hammer toe of right foot.  The various methods of treatment have been discussed with the patient.  No guarantees were given.  After consideration of risks, benefits and other options for treatment, the patient has consented to surgery.  I have reviewed the patients' chart and labs.     A history and physical examination was performed in my office.  The patient was reexamined.  There have been no changes to this history and physical examination.  [name] A

## 2021-01-15 NOTE — Anesthesia Postprocedure Evaluation (Signed)
Anesthesia Post Note  Patient: Vincent Cole  Procedure(s) Performed: Lillard Anes; LAPIDUS-TYPE (Right: Toe) WEIL (Right) HAMMER TOE CORRECTION (Right: Toe) CORRECTION OVERLAPPING TOES (Right)  Patient location during evaluation: PACU Anesthesia Type: General Level of consciousness: awake and alert and oriented Pain management: pain level controlled Vital Signs Assessment: post-procedure vital signs reviewed and stable Respiratory status: spontaneous breathing, nonlabored ventilation and respiratory function stable Cardiovascular status: blood pressure returned to baseline and stable Postop Assessment: no signs of nausea or vomiting Anesthetic complications: no   No notable events documented.   Last Vitals:  Vitals:   01/15/21 1345 01/15/21 1400  BP: 107/76   Pulse: 73 68  Resp: 14 15  Temp:    SpO2: 94% 94%    Last Pain:  Vitals:   01/15/21 1400  TempSrc:   PainSc: 5                  Mouhamad Teed

## 2021-01-15 NOTE — Discharge Instructions (Addendum)
[facility]  POST OPERATIVE INSTRUCTIONS FOR DR. FOWLER AND [facility] PODIATRY DEPART[facility]   Take your medication as prescribed.  Pain medication should be taken only as needed.  Keep the dressing clean, dry and intact.  Keep your foot elevated above the heart level for the first 48 hours.  We have instructed you to be non-weight bearing.  Always wear your post-op shoe when walking.  Always use your crutches if you are to be non-weight bearing.  Do not take a shower. Baths are permissible as long as the foot is kept out of the water.   Every hour you are awake:  Bend your knee 15 times.  Call [facility] ([phone]) if any of the following problems occur: You develop a temperature or fever. The bandage becomes saturated with blood. Medication does not stop your pain. Injury of the foot occurs. Any symptoms of infection including redness, odor, or red streaks running from wound.  AMBULATORY SURGERY  DISCHARGE INSTRUCTIONS   The drugs that you were given will stay in your system until tomorrow so for the next 24 hours you should not:  Drive an automobile Make any legal decisions Drink any alcoholic beverage   You may resume regular meals tomorrow.  Today it is better to start with liquids and gradually work up to solid foods.  You may eat anything you prefer, but it is better to start with liquids, then soup and crackers, and gradually work up to solid foods.   Please notify your doctor immediately if you have any unusual bleeding, trouble breathing, redness and pain at the surgery site, drainage, fever, or pain not relieved by medication.    Additional Instructions:  Please contact your physician with any problems or Same Day Surgery at [phone], Monday through Friday 6 am to 4 pm, or [place] [facility] [place] Main number at [phone]. 

## 2021-01-18 ENCOUNTER — Encounter: Payer: Self-pay | Admitting: Podiatry

## 2021-01-19 ENCOUNTER — Encounter: Payer: Self-pay | Admitting: Podiatry

## 2021-02-02 ENCOUNTER — Other Ambulatory Visit (HOSPITAL_COMMUNITY): Payer: Self-pay | Admitting: Psychiatry

## 2021-02-11 ENCOUNTER — Other Ambulatory Visit: Payer: Self-pay

## 2021-02-11 ENCOUNTER — Encounter: Payer: Self-pay | Admitting: Dermatology

## 2021-02-11 ENCOUNTER — Ambulatory Visit (INDEPENDENT_AMBULATORY_CARE_PROVIDER_SITE_OTHER): Payer: Medicare Other | Admitting: Dermatology

## 2021-02-11 DIAGNOSIS — L219 Seborrheic dermatitis, unspecified: Secondary | ICD-10-CM | POA: Diagnosis not present

## 2021-02-11 DIAGNOSIS — L720 Epidermal cyst: Secondary | ICD-10-CM

## 2021-02-11 MED ORDER — DOXYCYCLINE MONOHYDRATE 100 MG PO CAPS: 100.0000 mg | ORAL_CAPSULE | Freq: Two times a day (BID) | ORAL | 0 refills | Status: AC

## 2021-02-11 MED ORDER — KETOCONAZOLE 2 % EX SHAM: 1.0000 "application " | MEDICATED_SHAMPOO | CUTANEOUS | 1 refills | Status: DC

## 2021-02-11 NOTE — Progress Notes (Deleted)
   Follow-Up Visit   Subjective  Vincent Cole is a 70 y.o. male who presents for the following: No chief complaint on file..  ***  The following portions of the chart were reviewed this encounter and updated as appropriate:      {Review of Systems:34166::"No other skin or systemic complaints."}  Objective  Well appearing patient in no apparent distress; mood and affect are within normal limits.  {VHSJ:29090::"B full examination was performed including scalp, head, eyes, ears, nose, lips, neck, chest, axillae, abdomen, back, buttocks, bilateral upper extremities, bilateral lower extremities, hands, feet, fingers, toes, fingernails, and toenails. All findings within normal limits unless otherwise noted below."}   Assessment & Plan   No follow-ups on file.

## 2021-02-11 NOTE — Patient Instructions (Addendum)
Doxycycline should be taken with food to prevent nausea. Do not lay down for 30 minutes after taking. Be cautious with sun exposure and use good sun protection while on this medication. Pregnant women should not take this medication.     If you have any questions or concerns for your doctor, please call our main line at [phone] and press option 4 to reach your doctor's medical assistant. If no one answers, please leave a voicemail as directed and we will return your call as soon as possible. Messages left after 4 pm will be answered the following business day.   You may also send [place] a message via [place]. We typically respond to MyChart messages within 1-2 business days.  For prescription refills, please ask your pharmacy to contact our office. Our fax number is [phone].  If you have an urgent issue when the clinic is closed that cannot wait until the next business day, you can page your doctor at the number below.    Please note that while we do our best to be available for urgent issues outside of office hours, we are not available 24/7.   If you have an urgent issue and are unable to reach [place], you may choose to seek medical care at your doctor's office, retail clinic, urgent care center, or emergency room.  If you have a medical emergency, please immediately call 911 or go to the emergency department.  Pager Numbers  - Dr. Nehemiah Massed: [phone]  - Dr. Laurence Ferrari: [phone]  - Dr. Nicole Kindred: [phone]  In the event of inclement weather, please call our main line at [phone] for an update on the status of any delays or closures.  Dermatology Medication Tips: Please keep the boxes that topical medications come in in order to help keep track of the instructions about where and how to use these. Pharmacies typically print the medication instructions only on the boxes and not directly on the medication tubes.   If your medication is too expensive, please contact our office at  [phone] option 4 or send [place] a message through [place].   We are unable to tell what your co-pay for medications will be in advance as this is different depending on your insurance coverage. However, we may be able to find a substitute medication at lower cost or fill out paperwork to get insurance to cover a needed medication.   If a prior authorization is required to get your medication covered by your insurance company, please allow [place] 1-2 business days to complete this process.  Drug prices often vary depending on where the prescription is filled and some pharmacies may offer cheaper prices.  The website www.goodrx.com contains coupons for medications through different pharmacies. The prices here do not account for what the cost may be with help from insurance (it may be cheaper with your insurance), but the website can give you the price if you did not use any insurance.  - You can print the associated coupon and take it with your prescription to the pharmacy.  - You may also stop by our office during regular business hours and pick up a GoodRx coupon card.  - If you need your prescription sent electronically to a different pharmacy, notify our office through [facility] or by phone at [phone] option 4.

## 2021-02-11 NOTE — Progress Notes (Signed)
   New Patient Visit  Subjective  Vincent Cole is a 70 y.o. male who presents for the following: New Patient (Initial Visit) (Patient here today concerning a spot at back. Patient reports he has had since 70 years old. He reports son has drained it but it comes back. Patient reports it has been inflammed. ).  The following portions of the chart were reviewed this encounter and updated as appropriate:   Tobacco  Allergies  Meds  Problems  Med Hx  Surg Hx  Fam Hx     Review of Systems:  No other skin or systemic complaints except as noted in HPI or Assessment and Plan.  Objective  Well appearing patient in no apparent distress; mood and affect are within normal limits.  A focused examination was performed including back, face, scalp, eyebrows. Relevant physical exam findings are noted in the Assessment and Plan.  upper back Subcutaneous nodule.   bilateral eyebrows, scalp, face Pink scaly patches at eyebrows    Assessment & Plan  Epidermal inclusion cyst - with history of inflammation upper back Cyst with symptoms and/or recent change.  Discussed surgical excision to remove, including resulting scar and possible recurrence.  Patient will schedule for surgery. Pre-op information given.  doxycycline (MONODOX) 100 MG capsule - upper back Take 1 capsule (100 mg total) by mouth 2 (two) times daily for 10 days. Take with food - take 2 weeks before surgery  Seborrheic dermatitis bilateral eyebrows, scalp, face  Seborrheic Dermatitis  -  is a chronic persistent rash characterized by pinkness and scaling most commonly of the mid face but also can occur on the scalp (dandruff), ears; mid chest, mid back and groin.  It tends to be exacerbated by stress and cooler weather.  People who have neurologic disease may experience new onset or exacerbation of existing seborrheic dermatitis.  The condition is not curable but treatable and can be controlled.  Start Ketoconazole shampoo apply  three times per week, massage into scalp, eyebrow, and face and leave in for 10 minutes before rinsing out  ketoconazole (NIZORAL) 2 % shampoo - bilateral eyebrows, scalp, face Apply 1 application topically See admin instructions. apply three times per week, massage into scalp, face, and eyebrows and leave in for 10 minutes before rinsing out  Return for surgery for cyst .  I, Ruthell Rummage, CMA, am acting as scribe for Sarina Ser, MD. Documentation: I have reviewed the above documentation for accuracy and completeness, and I agree with the above.  Sarina Ser, MD

## 2021-02-14 ENCOUNTER — Other Ambulatory Visit (HOSPITAL_COMMUNITY): Payer: Self-pay | Admitting: Psychiatry

## 2021-02-16 ENCOUNTER — Encounter: Payer: Self-pay | Admitting: Dermatology

## 2021-02-25 ENCOUNTER — Other Ambulatory Visit: Payer: Self-pay | Admitting: Physician Assistant

## 2021-02-25 DIAGNOSIS — R413 Other amnesia: Secondary | ICD-10-CM

## 2021-02-25 DIAGNOSIS — F339 Major depressive disorder, recurrent, unspecified: Secondary | ICD-10-CM

## 2021-02-25 DIAGNOSIS — R4689 Other symptoms and signs involving appearance and behavior: Secondary | ICD-10-CM

## 2021-02-25 DIAGNOSIS — F688 Other specified disorders of adult personality and behavior: Secondary | ICD-10-CM

## 2021-03-16 ENCOUNTER — Ambulatory Visit (INDEPENDENT_AMBULATORY_CARE_PROVIDER_SITE_OTHER): Payer: Medicare Other | Admitting: Dermatology

## 2021-03-16 ENCOUNTER — Other Ambulatory Visit: Payer: Self-pay

## 2021-03-16 ENCOUNTER — Telehealth: Payer: Self-pay

## 2021-03-16 DIAGNOSIS — L928 Other granulomatous disorders of the skin and subcutaneous tissue: Secondary | ICD-10-CM

## 2021-03-16 DIAGNOSIS — D492 Neoplasm of unspecified behavior of bone, soft tissue, and skin: Secondary | ICD-10-CM

## 2021-03-16 MED ORDER — MUPIROCIN 2 % EX OINT: 1.0000 "application " | TOPICAL_OINTMENT | Freq: Every day | CUTANEOUS | 1 refills | Status: DC

## 2021-03-16 NOTE — Patient Instructions (Signed)
Wound Care Instructions  Cleanse wound gently with soap and water once a day then pat dry with clean gauze. Apply a thing coat of Petrolatum (petroleum jelly, "Vaseline") over the wound (unless you have an allergy to this). We recommend that you use a new, sterile tube of Vaseline. Do not pick or remove scabs. Do not remove the yellow or white "healing tissue" from the base of the wound.  Cover the wound with fresh, clean, nonstick gauze and secure with paper tape. You may use Band-Aids in place of gauze and tape if the would is small enough, but would recommend trimming much of the tape off as there is often too much. Sometimes Band-Aids can irritate the skin.  You should call the office for your biopsy report after 1 week if you have not already been contacted.  If you experience any problems, such as abnormal amounts of bleeding, swelling, significant bruising, significant pain, or evidence of infection, please call the office immediately.  FOR ADULT SURGERY PATIENTS: If you need something for pain relief you may take 1 extra strength Tylenol (acetaminophen) AND 2 Ibuprofen (200mg each) together every 4 hours as needed for pain. (do not take these if you are allergic to them or if you have a reason you should not take them.) Typically, you may only need pain medication for 1 to 3 days.   If you have any questions or concerns for your doctor, please call our main line at [phone] and press option 4 to reach your doctor's medical assistant. If no one answers, please leave a voicemail as directed and we will return your call as soon as possible. Messages left after 4 pm will be answered the following business day.   You may also send us a message via MyChart. We typically respond to MyChart messages within 1-2 business days.  For prescription refills, please ask your pharmacy to contact our office. Our fax number is [phone].  If you have an urgent issue when the clinic is closed that  cannot wait until the next business day, you can page your doctor at the number below.    Please note that while we do our best to be available for urgent issues outside of office hours, we are not available 24/7.   If you have an urgent issue and are unable to reach us, you may choose to seek medical care at your doctor's office, retail clinic, urgent care center, or emergency room.  If you have a medical emergency, please immediately call 911 or go to the emergency department.  Pager Numbers  - Dr. Kowalski: [phone]  - Dr. Moye: [phone]  - Dr. Stewart: [phone]  In the event of inclement weather, please call our main line at [phone] for an update on the status of any delays or closures.  Dermatology Medication Tips: Please keep the boxes that topical medications come in in order to help keep track of the instructions about where and how to use these. Pharmacies typically print the medication instructions only on the boxes and not directly on the medication tubes.   If your medication is too expensive, please contact our office at [phone] option 4 or send us a message through MyChart.   We are unable to tell what your co-pay for medications will be in advance as this is different depending on your insurance coverage. However, we may be able to find a substitute medication at lower cost or fill out paperwork to get insurance to cover a needed   medication.   If a prior authorization is required to get your medication covered by your insurance company, please allow us 1-2 business days to complete this process.  Drug prices often vary depending on where the prescription is filled and some pharmacies may offer cheaper prices.  The website www.goodrx.com contains coupons for medications through different pharmacies. The prices here do not account for what the cost may be with help from insurance (it may be cheaper with your insurance), but the website can give you the  price if you did not use any insurance.  - You can print the associated coupon and take it with your prescription to the pharmacy.  - You may also stop by our office during regular business hours and pick up a GoodRx coupon card.  - If you need your prescription sent electronically to a different pharmacy, notify our office through [place] MyChart or by phone at [phone] option 4.   

## 2021-03-16 NOTE — Progress Notes (Signed)
   Follow-Up Visit   Subjective  Vincent Cole is a 70 y.o. male who presents for the following: Cyst (Upper back, pt presents for excision).  The following portions of the chart were reviewed this encounter and updated as appropriate:   Tobacco  Allergies  Meds  Problems  Med Hx  Surg Hx  Fam Hx     Review of Systems:  No other skin or systemic complaints except as noted in HPI or Assessment and Plan.  Objective  Well appearing patient in no apparent distress; mood and affect are within normal limits.  A focused examination was performed including back. Relevant physical exam findings are noted in the Assessment and Plan.  L upper back paraspinal Cystic pap 1.2cm   Assessment & Plan  Neoplasm of skin L upper back paraspinal  Skin excision  Lesion length (cm):  1.2 Lesion width (cm):  1.2 Margin per side (cm):  0 Total excision diameter (cm):  1.2 Informed consent: discussed and consent obtained   Timeout: patient name, date of birth, surgical site, and procedure verified   Procedure prep:  Patient was prepped and draped in usual sterile fashion Prep type:  Isopropyl alcohol and povidone-iodine Anesthesia: the lesion was anesthetized in a standard fashion   Anesthetic:  1% lidocaine w/ epinephrine 1-100,000 buffered w/ 8.4% NaHCO3 (3cc lido w/ epi) Instrument used: #15 blade   Hemostasis achieved with: pressure   Hemostasis achieved with comment:  Electrocautery Outcome: patient tolerated procedure well with no complications   Post-procedure details: sterile dressing applied and wound care instructions given   Dressing type: pressure dressing and bacitracin (Mupirocin)    Skin repair Complexity:  Complex Final length (cm):  3 Reason for type of repair: reduce tension to allow closure, reduce the risk of dehiscence, infection, and necrosis, reduce subcutaneous dead space and avoid a hematoma, allow closure of the large defect, preserve normal anatomy, preserve  normal anatomical and functional relationships and enhance both functionality and cosmetic results   Undermining: area extensively undermined   Undermining comment:  Undermining Defect 1.2cm Subcutaneous layers (deep stitches):  Suture size:  2-0 Suture type: Vicryl (polyglactin 910)   Subcutaneous suture technique: Inverted Dermal. Fine/surface layer approximation (top stitches):  Suture size:  3-0 Suture type: nylon   Stitches: horizontal mattress   Suture removal (days):  7 Hemostasis achieved with: pressure Outcome: patient tolerated procedure well with no complications   Post-procedure details: sterile dressing applied and wound care instructions given   Dressing type: pressure dressing and bacitracin (Mupirocin)    mupirocin ointment (BACTROBAN) 2 % Apply 1 application topically daily. Qd to excision site  Specimen 1 - Surgical pathology Differential Diagnosis: D48.5 Cyst vs ptjer  Check Margins: No Cystic pap 1.2cm  Cyst vs other  Start Mupirocin oint qd to excison site  Return in about 1 week (around 03/23/2021) for suture removal.  I, Othelia Pulling, RMA, am acting as scribe for Sarina Ser, MD . Documentation: I have reviewed the above documentation for accuracy and completeness, and I agree with the above.  Sarina Ser, MD

## 2021-03-16 NOTE — Telephone Encounter (Signed)
Patient doing fine after today's surgery./sh 

## 2021-03-17 ENCOUNTER — Encounter: Payer: Self-pay | Admitting: Dermatology

## 2021-03-23 ENCOUNTER — Encounter: Payer: Self-pay | Admitting: Dermatology

## 2021-03-23 ENCOUNTER — Ambulatory Visit (INDEPENDENT_AMBULATORY_CARE_PROVIDER_SITE_OTHER): Payer: Medicare Other | Admitting: Dermatology

## 2021-03-23 ENCOUNTER — Other Ambulatory Visit: Payer: Self-pay

## 2021-03-23 DIAGNOSIS — Z4802 Encounter for removal of sutures: Secondary | ICD-10-CM

## 2021-03-23 NOTE — Patient Instructions (Signed)
After Suture Removal  If your medical team has placed Steri-Strips (white adhesive strips covering the surgical site to provide extra support): Keep the area dry until they fall off.  Do not peel them off. Just let them fall off on their own.  If the edges peel up, you can trim them with scissors.   If your team has not placed Steri-Strips: Wash the area daily with soap and water. Then coat the incision site with plain Vaseline and cover with a bandage. Do this daily for 5 days after the sutures are removed. After that, no additional wound care is generally needed.  However, if you would like to help fade the scar, you can apply a silicone scar cream, gel or sheet every night. The scar will remodel for one year after the procedure. If a skin cancer was removed, be sure to keep your appointment with your dermatologist for follow-up and let your dermatology team know if you have any new or changing spots between visits.    Please call our office at [phone] for any questions or concerns.   If you have any questions or concerns for your doctor, please call our main line at [phone] and press option 4 to reach your doctor's medical assistant. If no one answers, please leave a voicemail as directed and we will return your call as soon as possible. Messages left after 4 pm will be answered the following business day.   You may also send [place] a message via [place]. We typically respond to MyChart messages within 1-2 business days.  For prescription refills, please ask your pharmacy to contact our office. Our fax number is [phone].  If you have an urgent issue when the clinic is closed that cannot wait until the next business day, you can page your doctor at the number below.    Please note that while we do our best to be available for urgent issues outside of office hours, we are not available 24/7.   If you have an urgent issue and are unable to reach [place], you may choose to seek  medical care at your doctor's office, retail clinic, urgent care center, or emergency room.  If you have a medical emergency, please immediately call 911 or go to the emergency department.  Pager Numbers  - Dr. Nehemiah Massed: [phone]  - Dr. Laurence Ferrari: [phone]  - Dr. Nicole Kindred: [phone]  In the event of inclement weather, please call our main line at [phone] for an update on the status of any delays or closures.  Dermatology Medication Tips: Please keep the boxes that topical medications come in in order to help keep track of the instructions about where and how to use these. Pharmacies typically print the medication instructions only on the boxes and not directly on the medication tubes.   If your medication is too expensive, please contact our office at [phone] option 4 or send [place] a message through [place].   We are unable to tell what your co-pay for medications will be in advance as this is different depending on your insurance coverage. However, we may be able to find a substitute medication at lower cost or fill out paperwork to get insurance to cover a needed medication.   If a prior authorization is required to get your medication covered by your insurance company, please allow [place] 1-2 business days to complete this process.  Drug prices often vary depending on where the prescription is filled and some pharmacies may offer cheaper prices.  The website www.goodrx.com contains coupons for medications through different pharmacies. The prices here do not account for what the cost may be with help from insurance (it may be cheaper with your insurance), but the website can give you the price if you did not use any insurance.  - You can print the associated coupon and take it with your prescription to the pharmacy.  - You may also stop by our office during regular business hours and pick up a GoodRx coupon card.  - If you need your prescription sent electronically to a  different pharmacy, notify our office through [facility] or by phone at [phone] option 4.

## 2021-03-23 NOTE — Progress Notes (Signed)
   Follow-Up Visit   Subjective  Vincent Cole is a 70 y.o. 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 ).  The following portions of the chart were reviewed this encounter and updated as appropriate:  Tobacco  Allergies  Meds  Problems  Med Hx  Surg Hx  Fam Hx     Review of Systems: No other skin or systemic complaints except as noted in HPI or Assessment and Plan.  Objective  Well appearing patient in no apparent distress; mood and affect are within normal limits.  A focused examination was performed including back. Relevant physical exam findings are noted in the Assessment and Plan.  left upper back paraspinal Incision site is clean, dry and intact   Assessment & Plan  Encounter for removal of sutures left upper back paraspinal  Wound cleansed, sutures removed, wound cleansed and steri strips applied. Discussed pathology results. = benign cyst  Return if symptoms worsen or fail to improve.  IHarriett Sine, CMA, am acting as scribe for Sarina Ser, MD. Documentation: I have reviewed the above documentation for accuracy and completeness, and I agree with the above.  Sarina Ser, MD

## 2021-04-09 ENCOUNTER — Other Ambulatory Visit: Payer: Self-pay

## 2021-04-09 ENCOUNTER — Ambulatory Visit
Admission: RE | Admit: 2021-04-09 | Discharge: 2021-04-09 | Disposition: A | Discharge status: Home / Self Care | Payer: Medicare Other | Source: Ambulatory Visit | Attending: Physician Assistant | Admitting: Physician Assistant

## 2021-04-09 DIAGNOSIS — F688 Other specified disorders of adult personality and behavior: Secondary | ICD-10-CM | POA: Insufficient documentation

## 2021-04-09 DIAGNOSIS — R4689 Other symptoms and signs involving appearance and behavior: Secondary | ICD-10-CM | POA: Diagnosis present

## 2021-04-09 DIAGNOSIS — F339 Major depressive disorder, recurrent, unspecified: Secondary | ICD-10-CM | POA: Diagnosis present

## 2021-04-09 DIAGNOSIS — R413 Other amnesia: Secondary | ICD-10-CM | POA: Diagnosis present

## 2021-05-07 ENCOUNTER — Ambulatory Visit: Admission: RE | Admit: 2021-05-07 | Payer: Medicare Other | Source: Home / Self Care

## 2021-05-07 ENCOUNTER — Encounter: Admission: RE | Payer: Self-pay | Source: Home / Self Care

## 2021-05-07 SURGERY — COLONOSCOPY WITH PROPOFOL
Anesthesia: General

## 2021-06-25 ENCOUNTER — Ambulatory Visit
Admission: RE | Admit: 2021-06-25 | Discharge: 2021-06-25 | Disposition: A | Discharge status: Home / Self Care | Payer: Medicare Other | Attending: Gastroenterology | Admitting: Gastroenterology

## 2021-06-25 ENCOUNTER — Ambulatory Visit: Payer: Medicare Other | Admitting: Anesthesiology

## 2021-06-25 ENCOUNTER — Other Ambulatory Visit: Payer: Self-pay

## 2021-06-25 ENCOUNTER — Encounter
Admission: RE | Disposition: A | Discharge status: Home / Self Care | Payer: Self-pay | Source: Home / Self Care | Attending: Gastroenterology

## 2021-06-25 DIAGNOSIS — K640 First degree hemorrhoids: Secondary | ICD-10-CM | POA: Insufficient documentation

## 2021-06-25 DIAGNOSIS — K219 Gastro-esophageal reflux disease without esophagitis: Secondary | ICD-10-CM | POA: Insufficient documentation

## 2021-06-25 DIAGNOSIS — K921 Melena: Secondary | ICD-10-CM | POA: Diagnosis not present

## 2021-06-25 DIAGNOSIS — G4733 Obstructive sleep apnea (adult) (pediatric): Secondary | ICD-10-CM | POA: Diagnosis not present

## 2021-06-25 DIAGNOSIS — F32A Depression, unspecified: Secondary | ICD-10-CM | POA: Diagnosis not present

## 2021-06-25 DIAGNOSIS — Z79899 Other long term (current) drug therapy: Secondary | ICD-10-CM | POA: Insufficient documentation

## 2021-06-25 DIAGNOSIS — I10 Essential (primary) hypertension: Secondary | ICD-10-CM | POA: Insufficient documentation

## 2021-06-25 DIAGNOSIS — K5730 Diverticulosis of large intestine without perforation or abscess without bleeding: Secondary | ICD-10-CM | POA: Diagnosis not present

## 2021-06-25 HISTORY — PX: COLONOSCOPY WITH PROPOFOL: SHX5780

## 2021-06-25 SURGERY — COLONOSCOPY WITH PROPOFOL
Anesthesia: General

## 2021-06-25 MED ORDER — FENTANYL CITRATE (PF) 100 MCG/2ML IJ SOLN
INTRAMUSCULAR | Status: AC
  Filled 2021-06-25: qty 2

## 2021-06-25 MED ORDER — PROPOFOL 500 MG/50ML IV EMUL
INTRAVENOUS | Status: DC | PRN
  Administered 2021-06-25: 125 ug/kg/min via INTRAVENOUS

## 2021-06-25 MED ORDER — SODIUM CHLORIDE 0.9 % IV SOLN: INTRAVENOUS | Status: DC

## 2021-06-25 MED ORDER — MIDAZOLAM HCL 2 MG/2ML IJ SOLN
INTRAMUSCULAR | Status: AC
  Filled 2021-06-25: qty 2

## 2021-06-25 MED ORDER — LIDOCAINE HCL (CARDIAC) PF 100 MG/5ML IV SOSY
PREFILLED_SYRINGE | INTRAVENOUS | Status: DC | PRN
  Administered 2021-06-25: 50 mg via INTRAVENOUS

## 2021-06-25 MED ORDER — LIDOCAINE HCL (PF) 2 % IJ SOLN
INTRAMUSCULAR | Status: AC
  Filled 2021-06-25: qty 5

## 2021-06-25 MED ORDER — PROPOFOL 10 MG/ML IV BOLUS
INTRAVENOUS | Status: DC | PRN
  Administered 2021-06-25: 50 mg via INTRAVENOUS

## 2021-06-25 NOTE — Anesthesia Preprocedure Evaluation (Signed)
Anesthesia Evaluation  Patient identified by MRN, date of birth, ID band Patient awake    History of Anesthesia Complications (+) PONV and history of anesthetic complications  Airway Mallampati: III       Dental  (+) Teeth Intact, Implants   Pulmonary sleep apnea and Continuous Positive Airway Pressure Ventilation ,    breath sounds clear to auscultation       Cardiovascular Exercise Tolerance: Good hypertension, Pt. on medications  Rhythm:Regular Rate:Normal     Neuro/Psych Depression negative neurological ROS     GI/Hepatic Neg liver ROS, GERD  ,  Endo/Other  negative endocrine ROS  Renal/GU   negative genitourinary   Musculoskeletal  (+) Arthritis ,   Abdominal Normal abdominal exam  (+)   Peds negative pediatric ROS (+)  Hematology negative hematology ROS (+)   Anesthesia Other Findings   Reproductive/Obstetrics                             Anesthesia Physical Anesthesia Plan  ASA: 3  Anesthesia Plan: General   Post-op Pain Management:    Induction: Intravenous  PONV Risk Score and Plan:   Airway Management Planned: Natural Airway and Nasal Cannula  Additional Equipment:   Intra-op Plan:   Post-operative Plan:   Informed Consent: I have reviewed the patients History and Physical, chart, labs and discussed the procedure including the risks, benefits and alternatives for the proposed anesthesia with the patient or authorized representative who has indicated his/her understanding and acceptance.       Plan Discussed with: CRNA and Surgeon  Anesthesia Plan Comments:         Anesthesia Quick Evaluation

## 2021-06-25 NOTE — H&P (Signed)
Outpatient short stay form Pre-procedure 06/25/2021  Vincent Rubenstein, MD  Primary Physician: Perrin Maltese, MD  Reason for visit:  Hematochezia  History of present illness:    71 y/o gentleman with history of hypertension here for colonoscopy due to history of "bloody diarrhea" which resolved with antibiotics. States he had a colonoscopy a few years ago that was normal. No blood thinners. No family history of GI malignancies. No significant abdominal surgeries.    Current Facility-Administered Medications:    0.9 %  sodium chloride infusion, , Intravenous, Continuous, Otha Rickles, Hilton Cork, MD, Last Rate: 20 mL/hr at 06/25/21 1141, New Bag at 06/25/21 1141  Medications Prior to Admission  Medication Sig Dispense Refill Last Dose   amLODipine-benazepril (LOTREL) 5-40 MG capsule Take 1 capsule by mouth in the morning.   06/24/2021   CINNAMON PO Take 1,000 mg by mouth in the morning.   Past Week   DULoxetine (CYMBALTA) 30 MG capsule Take 30 mg by mouth in the morning.   Past Week at 0800   eszopiclone (LUNESTA) 2 MG TABS tablet Take 2 mg by mouth at bedtime.   06/24/2021 at 2200   fluticasone (FLONASE) 50 MCG/ACT nasal spray Place 1-2 sprays into both nostrils at bedtime.   Past Week   gabapentin (NEURONTIN) 300 MG capsule Take 300 mg by mouth 3 (three) times daily.   Past Week   hydrocortisone cream 1 % Apply 1 application topically 2 (two) times daily as needed (skin irritation/itching).   06/24/2021   ketoconazole (NIZORAL) 2 % shampoo Apply 1 application topically See admin instructions. apply three times per week, massage into scalp, face, and eyebrows and leave in for 10 minutes before rinsing out 120 mL 1 06/24/2021   meloxicam (MOBIC) 15 MG tablet Take 15 mg by mouth in the morning.   06/24/2021   mupirocin ointment (BACTROBAN) 2 % Apply 1 application topically daily. Qd to excision site 22 g 1 06/24/2021   Omega-3 Fatty Acids (OMEGA 3 PO) Take 1 capsule by mouth once a week.   Past Week    apixaban (ELIQUIS) 2.5 MG TABS tablet Take 1 tablet (2.5 mg total) by mouth 2 (two) times daily. 60 tablet 0    oxyCODONE-acetaminophen (PERCOCET) 5-325 MG tablet Take 1-2 tablets by mouth every 6 (six) hours as needed for severe pain. Max 6 tabs per day 30 tablet 0    risperiDONE (RISPERDAL) 0.25 MG tablet Take 1 tablet (0.25 mg total) by mouth 2 (two) times daily. 60 tablet 0      No Known Allergies   Past Medical History:  Diagnosis Date   Arthritis    Benign bladder mass    Complication of anesthesia    PONV x 1 in 2010   Depression    Frequency of urination    GERD (gastroesophageal reflux disease) no meds   Heart murmur    was told many years ago   History of recurrent UTIs    Hypertension    IC (interstitial cystitis)    Nocturia    OSA on CPAP nightly   Pelvic pain in male    PONV (postoperative nausea and vomiting)    Simple renal cyst    Urgency of urination    Urinary hesitancy     Review of systems:  Otherwise negative.    Physical Exam  Gen: Alert, oriented. Appears stated age.  HEENT: PERRLA. Lungs: No respiratory distress CV: RRR Abd: soft, benign, no masses Ext: No edema  Planned procedures: Proceed with colonoscopy. The patient understands the nature of the planned procedure, indications, risks, alternatives and potential complications including but not limited to bleeding, infection, perforation, damage to internal organs and possible oversedation/side effects from anesthesia. The patient agrees and gives consent to proceed.  Please refer to procedure notes for findings, recommendations and patient disposition/instructions.     Vincent Rubenstein, MD [facility]

## 2021-06-25 NOTE — Interval H&P Note (Signed)
History and Physical Interval Note:  06/25/2021 12:18 PM  Vincent Cole  has presented today for surgery, with the diagnosis of Screening Colon.  The various methods of treatment have been discussed with the patient and family. After consideration of risks, benefits and other options for treatment, the patient has consented to  Procedure(s): COLONOSCOPY WITH PROPOFOL (N/A) as a surgical intervention.  The patient's history has been reviewed, patient examined, no change in status, stable for surgery.  I have reviewed the patient's chart and labs.  Questions were answered to the patient's satisfaction.     Lesly Rubenstein  Ok to proceed with colonoscopy

## 2021-06-25 NOTE — Anesthesia Procedure Notes (Signed)
Procedure Name: MAC Date/Time: 06/25/2021 12:24 PM Performed by: Jerrye Noble, CRNA Pre-anesthesia Checklist: Patient identified, Emergency Drugs available, Suction available and Patient being monitored Patient Re-evaluated:Patient Re-evaluated prior to induction Oxygen Delivery Method: Nasal cannula

## 2021-06-25 NOTE — Op Note (Signed)
[facility] Patient Name: Vincent Cole Procedure Date: 06/25/2021 12:12 PM MRN: [id number] Account #: [id number] Date of Birth: 08-Jan-1951 Admit Type: Outpatient Age: 71 Room: [facility] ENDO ROOM 3 Gender: Male Note Status: Finalized Instrument Name: Park Meo [id number] Procedure:             Colonoscopy Indications:           Hematochezia Providers:             Andrey Farmer MD, MD Referring MD:          Perrin Maltese, MD (Referring MD) Medicines:             Monitored Anesthesia Care Complications:         No immediate complications. Estimated blood loss:                         Minimal. Procedure:             Pre-Anesthesia Assessment:                        - Prior to the procedure, a History and Physical was                         performed, and patient medications and allergies were                         reviewed. The patient is competent. The risks and                         benefits of the procedure and the sedation options and                         risks were discussed with the patient. All questions                         were answered and informed consent was obtained.                         Patient identification and proposed procedure were                         verified by the physician, the nurse, the                         anesthesiologist, the anesthetist and the technician                         in the endoscopy suite. Mental Status Examination:                         alert and oriented. Airway Examination: normal                         oropharyngeal airway and neck mobility. Respiratory                         Examination: clear to auscultation. CV Examination:  normal. Prophylactic Antibiotics: The patient does not                         require prophylactic antibiotics. Prior                         Anticoagulants: The patient has taken no previous                         anticoagulant or  antiplatelet agents. ASA Grade                         Assessment: II - A patient with mild systemic disease.                         After reviewing the risks and benefits, the patient                         was deemed in satisfactory condition to undergo the                         procedure. The anesthesia plan was to use monitored                         anesthesia care (MAC). Immediately prior to                         administration of medications, the patient was                         re-assessed for adequacy to receive sedatives. The                         heart rate, respiratory rate, oxygen saturations,                         blood pressure, adequacy of pulmonary ventilation, and                         response to care were monitored throughout the                         procedure. The physical status of the patient was                         re-assessed after the procedure.                        After obtaining informed consent, the colonoscope was                         passed under direct vision. Throughout the procedure,                         the patient's blood pressure, pulse, and oxygen                         saturations were monitored continuously. The  Colonoscope was introduced through the anus and                         advanced to the the terminal ileum. The colonoscopy                         was performed without difficulty. The patient                         tolerated the procedure well. The quality of the bowel                         preparation was good. Findings:      The perianal and digital rectal examinations were normal.      The terminal ileum appeared normal.      A few small-mouthed diverticula were found in the descending colon,       transverse colon and ascending colon.      Many small and large-mouthed diverticula were found in the sigmoid colon.      Internal hemorrhoids were found during retroflexion. The  hemorrhoids       were Grade I (internal hemorrhoids that do not prolapse).      Normal mucosa was found in the entire colon. Biopsies for histology were       taken with a cold forceps from the entire colon for evaluation of       microscopic colitis.      The exam was otherwise without abnormality on direct and retroflexion       views. Impression:            - The examined portion of the ileum was normal.                        - Diverticulosis in the descending colon, in the                         transverse colon and in the ascending colon.                        - Diverticulosis in the sigmoid colon.                        - Internal hemorrhoids.                        - Normal mucosa in the entire examined colon. Biopsied.                        - The examination was otherwise normal on direct and                         retroflexion views. Recommendation:        - Discharge patient to home.                        - Resume previous diet.                        - Continue present medications.                        -  Await pathology results.                        - Repeat colonoscopy is not recommended due to current                         age (48 years or older) for screening purposes.                        - Return to referring physician as previously                         scheduled. Procedure Code(s):     --- Professional ---                        [phone], Colonoscopy, flexible; with biopsy, single or                         multiple Diagnosis Code(s):     --- Professional ---                        K64.0, First degree hemorrhoids                        K92.1, Melena (includes Hematochezia)                        K57.30, Diverticulosis of large intestine without                         perforation or abscess without bleeding CPT copyright 2019 American Medical Association. All rights reserved. The codes documented in this report are preliminary and upon coder review may   be revised to meet current compliance requirements. Andrey Farmer MD, MD 06/25/2021 12:48:42 PM Number of Addenda: 0 Note Initiated On: 06/25/2021 12:12 PM Scope Withdrawal Time: 0 hours 9 minutes 49 seconds  Total Procedure Duration: 0 hours 14 minutes 30 seconds  Estimated Blood Loss:  Estimated blood loss was minimal.      [facility]

## 2021-06-25 NOTE — Anesthesia Postprocedure Evaluation (Signed)
Anesthesia Post Note  Patient: Vincent Cole  Procedure(s) Performed: COLONOSCOPY WITH PROPOFOL  Patient location during evaluation: PACU Anesthesia Type: General Level of consciousness: awake and awake and alert Pain management: satisfactory to patient Vital Signs Assessment: post-procedure vital signs reviewed and stable Respiratory status: spontaneous breathing and nonlabored ventilation Cardiovascular status: stable Anesthetic complications: no   No notable events documented.   Last Vitals:  Vitals:   06/25/21 1309 06/25/21 1319  BP: (!) 162/99 (!) 172/84  Pulse: 72 (!) 57  Resp: 15 20  Temp:    SpO2: 100% 100%    Last Pain:  Vitals:   06/25/21 1319  TempSrc:   PainSc: 0-No pain                 VAN STAVEREN,Bowen Kia

## 2021-06-25 NOTE — Transfer of Care (Signed)
Immediate Anesthesia Transfer of Care Note  Patient: Osker Ayoub  Procedure(s) Performed: COLONOSCOPY WITH PROPOFOL  Patient Location: PACU  Anesthesia Type:General  Level of Consciousness: sedated  Airway & Oxygen Therapy: Patient Spontanous Breathing and Patient connected to nasal cannula oxygen  Post-op Assessment: Report given to RN and Post -op Vital signs reviewed and stable  Post vital signs: Reviewed and stable  Last Vitals:  Vitals Value Taken Time  BP 137/85 06/25/21 1249  Temp 36.4 C 06/25/21 1249  Pulse 58 06/25/21 1252  Resp 14 06/25/21 1252  SpO2 100 % 06/25/21 1252  Vitals shown include unvalidated device data.  Last Pain:  Vitals:   06/25/21 1249  TempSrc: Temporal  PainSc: 0-No pain         Complications: No notable events documented.

## 2021-06-28 ENCOUNTER — Encounter: Payer: Self-pay | Admitting: Gastroenterology

## 2021-06-28 LAB — SURGICAL PATHOLOGY

## 2021-07-30 ENCOUNTER — Encounter: Payer: Self-pay | Admitting: Podiatry

## 2021-07-30 ENCOUNTER — Other Ambulatory Visit: Payer: Self-pay

## 2021-07-30 ENCOUNTER — Ambulatory Visit (INDEPENDENT_AMBULATORY_CARE_PROVIDER_SITE_OTHER): Payer: Medicare Other

## 2021-07-30 ENCOUNTER — Ambulatory Visit (INDEPENDENT_AMBULATORY_CARE_PROVIDER_SITE_OTHER): Payer: Medicare Other | Admitting: Podiatry

## 2021-07-30 DIAGNOSIS — M7752 Other enthesopathy of left foot: Secondary | ICD-10-CM

## 2021-07-30 DIAGNOSIS — M2011 Hallux valgus (acquired), right foot: Secondary | ICD-10-CM

## 2021-07-30 DIAGNOSIS — M2042 Other hammer toe(s) (acquired), left foot: Secondary | ICD-10-CM

## 2021-07-30 MED ORDER — BETAMETHASONE SOD PHOS & ACET 6 (3-3) MG/ML IJ SUSP: 3.0000 mg | Freq: Once | INTRAMUSCULAR | Status: DC

## 2021-07-30 NOTE — Progress Notes (Signed)
? ?Subjective: 71 y.o. male presents today as a new patient for evaluation of bunion deformity to the patient's right foot.  He did have bunion and second hammertoe surgery performed August 2022 at [facility], Dr. Vickki Muff.  Patient states that soon after surgery he noticed that he still had bunion deformity and now is very stiff and there is loss of sensation to the toes.  He presents for second opinion and further treatment and evaluation ? ?Patient also states that he has had some pain and tenderness associated to the left fourth toe.  He denies a history of injury.  He says that the toe sticks up compared to the adjacent digits and it rubs and it is very irritating in his shoes.  He presents for further treatment and evaluation ? ? ?Past Medical History:  ?Diagnosis Date  ? Arthritis   ? Benign bladder mass   ? Complication of anesthesia   ? PONV x 1 in 2010  ? Depression   ? Frequency of urination   ? GERD (gastroesophageal reflux disease) no meds  ? Heart murmur   ? was told many years ago  ? History of recurrent UTIs   ? Hypertension   ? IC (interstitial cystitis)   ? Nocturia   ? OSA on CPAP nightly  ? Pelvic pain in male   ? PONV (postoperative nausea and vomiting)   ? Simple renal cyst   ? Urgency of urination   ? Urinary hesitancy   ? ? ? ? ?Objective: ?Physical Exam ?General: The patient is alert and oriented x3 in no acute distress. ? ?Dermatology: Skin is cool, dry and supple bilateral lower extremities. Negative for open lesions or macerations. ? ?Vascular: Palpable pedal pulses bilaterally. No edema or erythema noted. Capillary refill within normal limits. ? ?Neurological: Epicritic and protective threshold grossly intact bilaterally.  ? ?Musculoskeletal Exam: Clinical evidence of bunion deformity noted to the respective foot. There is moderate pain on palpation range of motion of the first MPJ. Lateral deviation of the hallux noted consistent with hallux abductovalgus. ? ?Pain on palpation also  noted to the fourth digit left foot at the level of the PIPJ ? ?Radiographic Exam: Increased intermetatarsal angle greater than 15? with a hallux abductus angle greater than 30? noted on AP view. Moderate degenerative changes noted within the first MPJ.  Lapiplasty orthopedic plates and screws noted to the first TMT joint of the right foot.  Orthopedic hardware appears stable and intact.  There is also an orthopedic screw to the second metatarsal head which appears stable and intact with routine healing.  Good arthrodesis of the first TMT joint although there continues to be increased intermetatarsal angle and hallux valgus deformity ? ?Assessment: ?1. HAV w/ bunion deformity right ?2. PSxHx RT Lapiplasty and 2nd digit HT repair. [id number] ?3.  Capsulitis left fourth toe ? ? ?Plan of Care:  ?1. Patient was evaluated. X-Rays reviewed. ?2.  Today we discussed possible revisional surgery of the right foot although I did explain that revisional surgeries due to increased scar tissue and stiffness and it would not correct for the numbness in the foot.  If anything he would have increased numbness.  The patient understands.  I do feel there is revisional surgery would help better align the digits however this does, the cost of increased scar tissue and loss of sensation to the toes.  Patient will go home and discuss with his wife and think about whether or not he would like to  pursue revisional surgery of the right foot ?3.  Regarding the left fourth toe, injection of 0.5 cc Celestone Soluspan injected into the left fourth digit.  At the level of the PIPJ.  Patient tolerated well ?4.  Continue wearing good supportive shoes and sneakers ?5.  Return to clinic 4 weeks ? ? ?Edrick Kins, DPM ?[place] ? ?Dr. Edrick Kins, DPM  ?  ?[address]                                       ?[place], [place] [address]                ?Office [phone]  ?Fax [phone] ? ? ? ? ? ?

## 2021-09-27 DIAGNOSIS — F32A Depression, unspecified: Secondary | ICD-10-CM | POA: Insufficient documentation

## 2021-10-21 ENCOUNTER — Ambulatory Visit (INDEPENDENT_AMBULATORY_CARE_PROVIDER_SITE_OTHER): Payer: Medicare Other | Admitting: Dermatology

## 2021-10-21 DIAGNOSIS — L219 Seborrheic dermatitis, unspecified: Secondary | ICD-10-CM | POA: Diagnosis not present

## 2021-10-21 DIAGNOSIS — L570 Actinic keratosis: Secondary | ICD-10-CM

## 2021-10-21 DIAGNOSIS — R234 Changes in skin texture: Secondary | ICD-10-CM

## 2021-10-21 DIAGNOSIS — L304 Erythema intertrigo: Secondary | ICD-10-CM | POA: Diagnosis not present

## 2021-10-21 MED ORDER — TACROLIMUS 0.1 % EX OINT: TOPICAL_OINTMENT | Freq: Every day | CUTANEOUS | 3 refills | Status: DC

## 2021-10-21 MED ORDER — KETOCONAZOLE 2 % EX CREA: TOPICAL_CREAM | CUTANEOUS | 11 refills | Status: DC

## 2021-10-21 MED ORDER — KETOCONAZOLE 2 % EX SHAM: 1.0000 "application " | MEDICATED_SHAMPOO | CUTANEOUS | 11 refills | Status: DC

## 2021-10-21 MED ORDER — HYDROCORTISONE 2.5 % EX CREA: TOPICAL_CREAM | CUTANEOUS | 11 refills | Status: DC

## 2021-10-21 NOTE — Patient Instructions (Addendum)
Topical steroids (such as triamcinolone, fluocinolone, fluocinonide, mometasone, clobetasol, halobetasol, betamethasone, hydrocortisone) can cause thinning and lightening of the skin if they are used for too long in the same area. Your physician has selected the right strength medicine for your problem and area affected on the body. Please use your medication only as directed by your physician to prevent side effects.     If You Need Anything After Your Visit  If you have any questions or concerns for your doctor, please call our main line at [phone] and press option 4 to reach your doctor's medical assistant. If no one answers, please leave a voicemail as directed and we will return your call as soon as possible. Messages left after 4 pm will be answered the following business day.   You may also send [place] a message via [place]. We typically respond to MyChart messages within 1-2 business days.  For prescription refills, please ask your pharmacy to contact our office. Our fax number is [phone].  If you have an urgent issue when the clinic is closed that cannot wait until the next business day, you can page your doctor at the number below.    Please note that while we do our best to be available for urgent issues outside of office hours, we are not available 24/7.   If you have an urgent issue and are unable to reach [place], you may choose to seek medical care at your doctor's office, retail clinic, urgent care center, or emergency room.  If you have a medical emergency, please immediately call 911 or go to the emergency department.  Pager Numbers  - Dr. Nehemiah Massed: [phone]  - Dr. Laurence Ferrari: [phone]  - Dr. Nicole Kindred: [phone]  In the event of inclement weather, please call our main line at [phone] for an update on the status of any delays or closures.  Dermatology Medication Tips: Please keep the boxes that topical medications come in in order to help keep track  of the instructions about where and how to use these. Pharmacies typically print the medication instructions only on the boxes and not directly on the medication tubes.   If your medication is too expensive, please contact our office at [phone] option 4 or send [place] a message through [place].   We are unable to tell what your co-pay for medications will be in advance as this is different depending on your insurance coverage. However, we may be able to find a substitute medication at lower cost or fill out paperwork to get insurance to cover a needed medication.   If a prior authorization is required to get your medication covered by your insurance company, please allow [place] 1-2 business days to complete this process.  Drug prices often vary depending on where the prescription is filled and some pharmacies may offer cheaper prices.  The website www.goodrx.com contains coupons for medications through different pharmacies. The prices here do not account for what the cost may be with help from insurance (it may be cheaper with your insurance), but the website can give you the price if you did not use any insurance.  - You can print the associated coupon and take it with your prescription to the pharmacy.  - You may also stop by our office during regular business hours and pick up a GoodRx coupon card.  - If you need your prescription sent electronically to a different pharmacy, notify our office through [facility] or by phone at [phone]  option 4.     Si Usted Necesita Algo Despus de Su Visita  Tambin puede enviarnos un mensaje a travs de Pharmacist, community. Por lo general respondemos a los mensajes de MyChart en el transcurso de 1 a 2 das hbiles.  Para renovar recetas, por favor pida a su farmacia que se ponga en contacto con nuestra oficina. Harland Dingwall de fax es [place] [phone].  Si tiene un asunto urgente cuando la clnica est cerrada y que no puede esperar hasta el siguiente da  hbil, puede llamar/localizar a su doctor(a) al nmero que aparece a continuacin.   Por favor, tenga en cuenta que aunque hacemos todo lo posible para estar disponibles para asuntos urgentes fuera del horario de [place], no estamos disponibles las 24 horas del da, los 7 das de la [place].   Si tiene un problema urgente y no puede comunicarse con nosotros, puede optar por buscar atencin mdica  en el consultorio de su doctor(a), en una clnica privada, en un centro de atencin urgente o en una sala de emergencias.  Si tiene Engineering geologist, por favor llame inmediatamente al 911 o vaya a la sala de emergencias.  Nmeros de bper  - Dr. Nehemiah Massed: [phone]  - Dra. Moye: [phone]  - Dra. Nicole Kindred: [phone]  En caso de inclemencias del [place], por favor llame a Johnsie Kindred principal al [phone] para una actualizacin sobre el [place] de cualquier retraso o cierre.  Consejos para la medicacin en dermatologa: Por favor, guarde las cajas en las que vienen los medicamentos de uso tpico para ayudarle a seguir las instrucciones sobre dnde y cmo usarlos. Las farmacias generalmente imprimen las instrucciones del medicamento slo en las cajas y no directamente en los tubos del [place].   Si su medicamento es muy caro, por favor, pngase en contacto con Zigmund Daniel llamando al [phone] y presione la opcin 4 o envenos un mensaje a travs de Pharmacist, community.   No podemos decirle cul ser su copago por los medicamentos por adelantado ya que esto es diferente dependiendo de la cobertura de su seguro. Sin embargo, es posible que podamos encontrar un medicamento sustituto a Electrical engineer un formulario para que el seguro cubra el medicamento que se considera necesario.   Si se requiere una autorizacin previa para que su compaa de seguros [place] su medicamento, por favor permtanos de 1 a 2 das hbiles para completar este proceso.  Los precios de los medicamentos  varan con frecuencia dependiendo del Environmental consultant de dnde se surte la receta y alguna farmacias pueden ofrecer precios ms baratos.  El sitio web www.goodrx.com tiene cupones para medicamentos de Airline pilot. Los precios aqu no tienen en cuenta lo que podra costar con la ayuda del seguro (puede ser ms barato con su seguro), pero el sitio web puede darle el precio si no utiliz Research scientist (physical sciences).  - Puede imprimir el cupn correspondiente y llevarlo con su receta a la farmacia.  - Tambin puede pasar por nuestra oficina durante el horario de atencin regular y Charity fundraiser una tarjeta de cupones de GoodRx.  - Si necesita que su receta se enve electrnicamente a una farmacia diferente, informe a nuestra oficina a travs de MyChart de [place] o por telfono llamando al [phone] y presione la opcin 4.

## 2021-10-21 NOTE — Progress Notes (Signed)
Follow-Up Visit   Subjective  Vincent Cole is a 71 y.o. male who presents for the following: Other (Patient reports a growth over left eyebrow for a long time patient reports that a small amount of pain when touches. /Patient reports a spot at gluteal crease that he has used hydrocortisone that will not get a way. ). The patient has spots, moles and lesions to be evaluated, some may be new or changing and the patient has concerns that these could be cancer.   The following portions of the chart were reviewed this encounter and updated as appropriate:  Tobacco  Allergies  Meds  Problems  Med Hx  Surg Hx  Fam Hx     Review of Systems: No other skin or systemic complaints except as noted in HPI or Assessment and Plan.  Objective  Well appearing patient in no apparent distress; mood and affect are within normal limits.  A focused examination was performed including face, neck, chest and back. Relevant physical exam findings are noted in the Assessment and Plan.  scalp, face Pink patches with greasy scale.  left lateral brow x 1 Hypertrophic Erythematous thin papules/macules with gritty scale. .   Superior gluteal crease -  pinkness and fissure  Assessment & Plan  Fissure in skin of superior gluteal crease With intertrigo superior gluteal crease  Intertrigo is a chronic recurrent rash that occurs in skin fold areas that may be associated with friction; heat; moisture; yeast; fungus; and bacteria.  It is exacerbated by increased movement / activity; sweating; and higher atmospheric temperature.  Recommend starting Tacrolimus ointment 0.1% - apply to aa's gluteal crease daily as needed.   Will recheck at follow up Chronic and persistent condition with duration or expected duration over one year. Condition is symptomatic / bothersome to patient. Not to goal.  Patient given goodrx card and informed to call if insurance did not cover medication.   tacrolimus (PROTOPIC) 0.1 %  ointment - superior gluteal crease Apply topically daily. Use as needed daily to gluteal crease  Seborrheic dermatitis scalp, face Seborrheic Dermatitis  -  is a chronic persistent rash characterized by pinkness and scaling most commonly of the mid face but also can occur on the scalp (dandruff), ears; mid chest, mid back and groin.  It tends to be exacerbated by stress and cooler weather.  People who have neurologic disease may experience new onset or exacerbation of existing seborrheic dermatitis.  The condition is not curable but treatable and can be controlled.  Continue ketoconazole shampoo 2 % as directed  Start ketoconazole cream 2% - apply topically to aa's of face M,WF at bedtime Start hct 2.5 % cream apply topically to aa's of face T,Th, Sat at bedtime.   Topical steroids (such as triamcinolone, fluocinolone, fluocinonide, mometasone, clobetasol, halobetasol, betamethasone, hydrocortisone) can cause thinning and lightening of the skin if they are used for too long in the same area. Your physician has selected the right strength medicine for your problem and area affected on the body. Please use your medication only as directed by your physician to prevent side effects.   ketoconazole (NIZORAL) 2 % shampoo - scalp, face Apply 1 application. topically See admin instructions. apply three times per week, massage into scalp, face, and eyebrows and leave in for 10 minutes before rinsing out ketoconazole (NIZORAL) 2 % cream - scalp, face Apply topically to aa of face m-w- f- at bedtime hydrocortisone 2.5 % cream - scalp, face Apply topically to aa of face  t- thur- sat nightly  Actinic keratosis left lateral brow x 1 Hypertrophic ak at left lateral brow  Will recheck at 2 month follow up, if area has not resolved, will plan to biopsy   Actinic keratoses are precancerous spots that appear secondary to cumulative UV radiation exposure/sun exposure over time. They are chronic with expected  duration over 1 year. A portion of actinic keratoses will progress to squamous cell carcinoma of the skin. It is not possible to reliably predict which spots will progress to skin cancer and so treatment is recommended to prevent development of skin cancer.  Recommend daily broad spectrum sunscreen SPF 30+ to sun-exposed areas, reapply every 2 hours as needed.  Recommend staying in the shade or wearing long sleeves, sun glasses (UVA+UVB protection) and wide brim hats (4-inch brim around the entire circumference of the hat). Call for new or changing lesions.  Destruction of lesion - left lateral brow x 1 Complexity: simple   Destruction method: cryotherapy   Informed consent: discussed and consent obtained   Timeout:  patient name, date of birth, surgical site, and procedure verified Lesion destroyed using liquid nitrogen: Yes   Region frozen until ice ball extended beyond lesion: Yes   Outcome: patient tolerated procedure well with no complications   Post-procedure details: wound care instructions given   Additional details:  Prior to procedure, discussed risks of blister formation, small wound, skin dyspigmentation, or rare scar following cryotherapy. Recommend Vaseline ointment to treated areas while healing.  Return in about 2 months (around 12/21/2021) for recheck ak at left lateral eyebrow / fissure at buttock . IRuthell Rummage, CMA, am acting as scribe for Sarina Ser, MD. Documentation: I have reviewed the above documentation for accuracy and completeness, and I agree with the above.  Sarina Ser, MD

## 2021-10-25 ENCOUNTER — Encounter: Payer: Self-pay | Admitting: Dermatology

## 2021-12-23 ENCOUNTER — Ambulatory Visit: Payer: Medicare Other | Admitting: Dermatology

## 2022-02-22 ENCOUNTER — Telehealth: Payer: Self-pay | Admitting: *Deleted

## 2022-02-22 NOTE — Patient Outreach (Signed)
  Care Coordination   02/22/2022 Name: Vincent Cole MRN: [id number] DOB: 1951-01-31   Care Coordination Outreach Attempts:  An unsuccessful telephone outreach was attempted today to offer the patient information about available care coordination services as a benefit of their health plan.   Follow Up Plan:  Additional outreach attempts will be made to offer the patient care coordination information and services.   Encounter Outcome:  No Answer  Care Coordination Interventions Activated:  No   Care Coordination Interventions:  No, not indicated    Jacqlyn Larsen [facility], [place] RN Care Coordinator [phone]

## 2022-02-23 ENCOUNTER — Telehealth: Payer: Self-pay | Admitting: *Deleted

## 2022-02-23 NOTE — Patient Outreach (Signed)
  Care Coordination   02/23/2022 Name: Vincent Cole MRN: [id number] DOB: 12-27-1950   Care Coordination Outreach Attempts:  A second unsuccessful outreach was attempted today to offer the patient with information about available care coordination services as a benefit of their health plan.     Follow Up Plan:  Additional outreach attempts will be made to offer the patient care coordination information and services.   Encounter Outcome:  No Answer  Care Coordination Interventions Activated:  No   Care Coordination Interventions:  No, not indicated    Jacqlyn Larsen [facility], [place] RN Care Coordinator [phone]

## 2022-02-24 ENCOUNTER — Telehealth: Payer: Self-pay | Admitting: *Deleted

## 2022-02-24 NOTE — Patient Outreach (Signed)
  Care Coordination   02/24/2022 Name: Vincent Cole MRN: [id number] DOB: 14-Jul-1950   Care Coordination Outreach Attempts:  A third unsuccessful outreach was attempted today to offer the patient with information about available care coordination services as a benefit of their health plan.   Follow Up Plan:  No further outreach attempts will be made at this time. We have been unable to contact the patient to offer or enroll patient in care coordination services  Encounter Outcome:  No Answer  Care Coordination Interventions Activated:  No   Care Coordination Interventions:  No, not indicated    Jacqlyn Larsen [facility], BSN [facility] RN Care Coordinator [phone]

## 2022-05-05 ENCOUNTER — Ambulatory Visit (INDEPENDENT_AMBULATORY_CARE_PROVIDER_SITE_OTHER): Payer: Medicare Other | Admitting: Dermatology

## 2022-05-05 ENCOUNTER — Encounter: Payer: Self-pay | Admitting: Dermatology

## 2022-05-05 VITALS — BP 96/54 | HR 60

## 2022-05-05 DIAGNOSIS — Z5111 Encounter for antineoplastic chemotherapy: Secondary | ICD-10-CM

## 2022-05-05 DIAGNOSIS — L570 Actinic keratosis: Secondary | ICD-10-CM

## 2022-05-05 DIAGNOSIS — L578 Other skin changes due to chronic exposure to nonionizing radiation: Secondary | ICD-10-CM | POA: Diagnosis not present

## 2022-05-05 DIAGNOSIS — Z79899 Other long term (current) drug therapy: Secondary | ICD-10-CM | POA: Diagnosis not present

## 2022-05-05 MED ORDER — FLUOROURACIL 5 % EX CREA: TOPICAL_CREAM | Freq: Two times a day (BID) | CUTANEOUS | 2 refills | Status: DC

## 2022-05-05 NOTE — Progress Notes (Signed)
Follow-Up Visit   Subjective  Vincent Cole is a 71 y.o. male who presents for the following: Bleeding/Bruising (Arms, pt says he bruises often) and check spot (Scalp, scaly spot). The patient has spots, moles and lesions to be evaluated, some may be new or changing and the patient has concerns that these could be cancer.  The following portions of the chart were reviewed this encounter and updated as appropriate:   Tobacco  Allergies  Meds  Problems  Med Hx  Surg Hx  Fam Hx     Review of Systems:  No other skin or systemic complaints except as noted in HPI or Assessment and Plan.  Objective  Well appearing patient in no apparent distress; mood and affect are within normal limits.  A focused examination was performed including face, scalp, amrs. Relevant physical exam findings are noted in the Assessment and Plan.  Scalp x 20, face x 1 (21) Pink scaly macules   Assessment & Plan   Purpura - Chronic; persistent and recurrent.  Treatable, but not curable. - Violaceous macules and patches - Benign - Related to trauma, age, sun damage and/or use of blood thinners, chronic use of topical and/or oral steroids - Observe - Can use OTC arnica containing moisturizer such as Dermend Bruise Formula if desired - Call for worsening or other concerns   Actinic Damage with PreCancerous Actinic Keratoses Counseling for Topical Chemotherapy Management: Patient exhibits: - Severe, confluent actinic changes with pre-cancerous actinic keratoses that is secondary to cumulative UV radiation exposure over time - Condition that is severe; chronic, not at goal. - diffuse scaly erythematous macules and papules with underlying dyspigmentation - Discussed Prescription "Field Treatment" topical Chemotherapy for Severe, Chronic Confluent Actinic Changes with Pre-Cancerous Actinic Keratoses Field treatment involves treatment of an entire area of skin that has confluent Actinic Changes (Sun/  Ultraviolet light damage) and PreCancerous Actinic Keratoses by method of PhotoDynamic Therapy (PDT) and/or prescription Topical Chemotherapy agents such as 5-fluorouracil, 5-fluorouracil/calcipotriene, and/or imiquimod.  The purpose is to decrease the number of clinically evident and subclinical PreCancerous lesions to prevent progression to development of skin cancer by chemically destroying early precancer changes that may or may not be visible.  It has been shown to reduce the risk of developing skin cancer in the treated area. As a result of treatment, redness, scaling, crusting, and open sores may occur during treatment course. One or more than one of these methods may be used and may have to be used several times to control, suppress and eliminate the PreCancerous changes. Discussed treatment course, expected reaction, and possible side effects. - Recommend daily broad spectrum sunscreen SPF 30+ to sun-exposed areas, reapply every 2 hours as needed.  - Staying in the shade or wearing long sleeves, sun glasses (UVA+UVB protection) and wide brim hats (4-inch brim around the entire circumference of the hat) are also recommended. - Call for new or changing lesions.  - - On Feburary 1, 2024 Start 5-fluorouracil/calcipotriene cream twice a day for 7 days to affected areas including scalp. Prescription sent to [facility]. Patient advised they will receive an email to purchase the medication online and have it sent to their home. Patient provided with handout reviewing treatment course and side effects and advised to call or message [place] on MyChart with any concerns.  Reviewed course of treatment and expected reaction.  Patient advised to expect inflammation and crusting and advised that erosions are possible.  Patient advised to be diligent with sun protection  during and after treatment. Counseled to keep medication out of reach of children and pets.   AK (actinic keratosis)  (21) Scalp x 20, face x 1  Destruction of lesion - Scalp x 20, face x 1 Complexity: simple   Destruction method: cryotherapy   Informed consent: discussed and consent obtained   Timeout:  patient name, date of birth, surgical site, and procedure verified Lesion destroyed using liquid nitrogen: Yes   Region frozen until ice ball extended beyond lesion: Yes   Outcome: patient tolerated procedure well with no complications   Post-procedure details: wound care instructions given    fluorouracil (EFUDEX) 5 % cream - Scalp x 20, face x 1 Apply topically 2 (two) times daily. Starting 06/23/2022 apply to scalp bid for 7 days   Return in about 6 months (around 11/04/2022) for AK f/u.  I, Othelia Pulling, RMA, am acting as scribe for Sarina Ser, MD .  Documentation: I have reviewed the above documentation for accuracy and completeness, and I agree with the above.  Sarina Ser, MD

## 2022-05-05 NOTE — Patient Instructions (Addendum)
For bruising on arms Can use over the counter arnica containing moisturizer such as Dermend Bruise Formula if desired    On Feburary 1, 2024 Start 5-fluorouracil/calcipotriene cream twice a day for 7 days to affected areas including scalp. Prescription sent to [facility]. Patient advised they will receive an email to purchase the medication online and have it sent to their home. Patient provided with handout reviewing treatment course and side effects and advised to call or message [place] on MyChart with any concerns.    Instructions for [facility] Medications  One or more of your medications was sent to the [facility]. You will receive an email from them and can purchase the medicine through that link. It will then be mailed to your home at the address you confirmed. If for any reason you do not receive an email from them, please check your spam folder. If you still do not find the email, please let us know. [facility] phone number is [phone].     5-Fluorouracil/Calcipotriene Patient Education   Actinic keratoses are the dry, red scaly spots on the skin caused by sun damage. A portion of these spots can turn into skin cancer with time, and treating them can help prevent development of skin cancer.   Treatment of these spots requires removal of the defective skin cells. There are various ways to remove actinic keratoses, including freezing with liquid nitrogen, treatment with creams, or treatment with a blue light procedure in the office.   5-fluorouracil cream is a topical cream used to treat actinic keratoses. It works by interfering with the growth of abnormal fast-growing skin cells, such as actinic keratoses. These cells peel off and are replaced by healthy ones.   5-fluorouracil/calcipotriene is a combination of the 5-fluorouracil cream with a vitamin D analog cream called calcipotriene. The calcipotriene alone does  not treat actinic keratoses. However, when it is combined with 5-fluorouracil, it helps the 5-fluorouracil treat the actinic keratoses much faster so that the same results can be achieved with a much shorter treatment time.  INSTRUCTIONS FOR 5-FLUOROURACIL/CALCIPOTRIENE CREAM:   5-fluorouracil/calcipotriene cream typically only needs to be used for 4-7 days. A thin layer should be applied twice a day to the treatment areas recommended by your physician.   If your physician prescribed you separate tubes of 5-fluourouracil and calcipotriene, apply a thin layer of 5-fluorouracil followed by a thin layer of calcipotriene.   Avoid contact with your eyes, nostrils, and mouth. Do not use 5-fluorouracil/calcipotriene cream on infected or open wounds.   You will develop redness, irritation and some crusting at areas where you have pre-cancer damage/actinic keratoses. IF YOU DEVELOP PAIN, BLEEDING, OR SIGNIFICANT CRUSTING, STOP THE TREATMENT EARLY - you have already gotten a good response and the actinic keratoses should clear up well.  Wash your hands after applying 5-fluorouracil 5% cream on your skin.   A moisturizer or sunscreen with a minimum SPF 30 should be applied each morning.   Once you have finished the treatment, you can apply a thin layer of Vaseline twice a day to irritated areas to soothe and calm the areas more quickly. If you experience significant discomfort, contact your physician.  For some patients it is necessary to repeat the treatment for best results.  SIDE EFFECTS: When using 5-fluorouracil/calcipotriene cream, you may have mild irritation, such as redness, dryness, swelling, or a mild burning sensation. This usually resolves within 2 weeks. The more actinic keratoses you have, the  more redness and inflammation you can expect during treatment. Eye irritation has been reported rarely. If this occurs, please let us know.  If you have any trouble using this cream, please call the  office. If you have any other questions about this information, please do not hesitate to ask me before you leave the office.     Due to recent changes in healthcare laws, you may see results of your pathology and/or laboratory studies on MyChart before the doctors have had a chance to review them. We understand that in some cases there may be results that are confusing or concerning to you. Please understand that not all results are received at the same time and often the doctors may need to interpret multiple results in order to provide you with the best plan of care or course of treatment. Therefore, we ask that you please give [place] 2 business days to thoroughly review all your results before contacting the office for clarification. Should we see a critical lab result, you will be contacted sooner.   If You Need Anything After Your Visit  If you have any questions or concerns for your doctor, please call our main line at [phone] and press option 4 to reach your doctor's medical assistant. If no one answers, please leave a voicemail as directed and we will return your call as soon as possible. Messages left after 4 pm will be answered the following business day.   You may also send [place] a message via [facility]. We typically respond to MyChart messages within 1-2 business days.  For prescription refills, please ask your pharmacy to contact our office. Our fax number is [phone].  If you have an urgent issue when the clinic is closed that cannot wait until the next business day, you can page your doctor at the number below.    Please note that while we do our best to be available for urgent issues outside of office hours, we are not available 24/7.   If you have an urgent issue and are unable to reach [place], you may choose to seek medical care at your doctor's office, retail clinic, urgent care center, or emergency room.  If you have a medical emergency, please immediately call 911 or go to the  emergency department.  Pager Numbers  - Dr. Nehemiah Massed: [phone]  - Dr. Laurence Ferrari: [phone]  - Dr. Nicole Kindred: [phone]  In the event of inclement weather, please call our main line at [phone] for an update on the status of any delays or closures.  Dermatology Medication Tips: Please keep the boxes that topical medications come in in order to help keep track of the instructions about where and how to use these. Pharmacies typically print the medication instructions only on the boxes and not directly on the medication tubes.   If your medication is too expensive, please contact our office at [phone] option 4 or send [place] a message through [place].   We are unable to tell what your co-pay for medications will be in advance as this is different depending on your insurance coverage. However, we may be able to find a substitute medication at lower cost or fill out paperwork to get insurance to cover a needed medication.   If a prior authorization is required to get your medication covered by your insurance company, please allow [place] 1-2 business days to complete this process.  Drug prices often vary depending on where the prescription is filled and some pharmacies may offer cheaper prices.  The website www.goodrx.com contains coupons for medications through different pharmacies. The prices here do not account for what the cost may be with help from insurance (it may be cheaper with your insurance), but the website can give you the price if you did not use any insurance.  - You can print the associated coupon and take it with your prescription to the pharmacy.  - You may also stop by our office during regular business hours and pick up a GoodRx coupon card.  - If you need your prescription sent electronically to a different pharmacy, notify our office through [facility] or by phone at [phone] option 4.     Si Usted Necesita Algo Despus de Su Visita  Tambin puede  enviarnos un mensaje a travs de Pharmacist, community. Por lo general respondemos a los mensajes de MyChart en el transcurso de 1 a 2 das hbiles.  Para renovar recetas, por favor pida a su farmacia que se ponga en contacto con nuestra oficina. Harland Dingwall de fax es [place] [phone].  Si tiene un asunto urgente cuando la clnica est cerrada y que no puede esperar hasta el siguiente da hbil, puede llamar/localizar a su doctor(a) al nmero que aparece a continuacin.   Por favor, tenga en cuenta que aunque hacemos todo lo posible para estar disponibles para asuntos urgentes fuera del horario de [place], no estamos disponibles las 24 horas del da, los 7 das de la [place].   Si tiene un problema urgente y no puede comunicarse con nosotros, puede optar por buscar atencin mdica  en el consultorio de su doctor(a), en una clnica privada, en un centro de atencin urgente o en una sala de emergencias.  Si tiene Engineering geologist, por favor llame inmediatamente al 911 o vaya a la sala de emergencias.  Nmeros de bper  - Dr. Nehemiah Massed: [phone]  - Dra. Moye: [phone]  - Dra. Nicole Kindred: [phone]  En caso de inclemencias del [place], por favor llame a Johnsie Kindred principal al [phone] para una actualizacin sobre el [place] de cualquier retraso o cierre.  Consejos para la medicacin en dermatologa: Por favor, guarde las cajas en las que vienen los medicamentos de uso tpico para ayudarle a seguir las instrucciones sobre dnde y cmo usarlos. Las farmacias generalmente imprimen las instrucciones del medicamento slo en las cajas y no directamente en los tubos del [place].   Si su medicamento es muy caro, por favor, pngase en contacto con Zigmund Daniel llamando al [phone] y presione la opcin 4 o envenos un mensaje a travs de Pharmacist, community.   No podemos decirle cul ser su copago por los medicamentos por adelantado ya que esto es diferente dependiendo de la cobertura de su seguro.  Sin embargo, es posible que podamos encontrar un medicamento sustituto a Electrical engineer un formulario para que el seguro cubra el medicamento que se considera necesario.   Si se requiere una autorizacin previa para que su compaa de seguros [place] su medicamento, por favor permtanos de 1 a 2 das hbiles para completar este proceso.  Los precios de los medicamentos varan con frecuencia dependiendo del Environmental consultant de dnde se surte la receta y alguna farmacias pueden ofrecer precios ms baratos.  El sitio web www.goodrx.com tiene cupones para medicamentos de Airline pilot. Los precios aqu no tienen en cuenta lo que podra costar con la ayuda del seguro (puede ser ms barato con su seguro), pero el sitio web puede darle el precio si no utiliz Research scientist (physical sciences).  -  Puede imprimir el cupn correspondiente y llevarlo con su receta a la farmacia.  - Tambin puede pasar por nuestra oficina durante el horario de atencin regular y Charity fundraiser una tarjeta de cupones de GoodRx.  - Si necesita que su receta se enve electrnicamente a una farmacia diferente, informe a nuestra oficina a travs de MyChart de [place] o por telfono llamando al [phone] y presione la opcin 4.

## 2022-05-19 ENCOUNTER — Encounter: Payer: Self-pay | Admitting: Dermatology

## 2022-06-05 ENCOUNTER — Other Ambulatory Visit: Payer: Self-pay

## 2022-06-05 ENCOUNTER — Emergency Department
Admission: EM | Admit: 2022-06-05 | Discharge: 2022-06-05 | Disposition: A | Discharge status: Home / Self Care | Payer: Medicare Other | Attending: Emergency Medicine | Admitting: Emergency Medicine

## 2022-06-05 DIAGNOSIS — R519 Headache, unspecified: Secondary | ICD-10-CM | POA: Insufficient documentation

## 2022-06-05 DIAGNOSIS — Z1152 Encounter for screening for COVID-19: Secondary | ICD-10-CM | POA: Diagnosis not present

## 2022-06-05 DIAGNOSIS — Z79899 Other long term (current) drug therapy: Secondary | ICD-10-CM | POA: Insufficient documentation

## 2022-06-05 DIAGNOSIS — R0981 Nasal congestion: Secondary | ICD-10-CM | POA: Insufficient documentation

## 2022-06-05 DIAGNOSIS — K219 Gastro-esophageal reflux disease without esophagitis: Secondary | ICD-10-CM | POA: Diagnosis not present

## 2022-06-05 DIAGNOSIS — I10 Essential (primary) hypertension: Secondary | ICD-10-CM | POA: Diagnosis present

## 2022-06-05 DIAGNOSIS — Z86711 Personal history of pulmonary embolism: Secondary | ICD-10-CM | POA: Diagnosis not present

## 2022-06-05 LAB — RESP PANEL BY RT-PCR (RSV, FLU A&B, COVID)  RVPGX2
Influenza A by PCR: NEGATIVE
Influenza B by PCR: NEGATIVE
Resp Syncytial Virus by PCR: NEGATIVE
SARS Coronavirus 2 by RT PCR: NEGATIVE

## 2022-06-05 MED ORDER — OMEPRAZOLE MAGNESIUM 20 MG PO TBEC: 40.0000 mg | DELAYED_RELEASE_TABLET | Freq: Every day | ORAL | 11 refills | Status: DC

## 2022-06-05 MED ORDER — ACETAMINOPHEN 500 MG PO TABS: 1000.0000 mg | ORAL_TABLET | Freq: Four times a day (QID) | ORAL | 2 refills | Status: AC | PRN

## 2022-06-05 MED ORDER — SUCRALFATE 1 G PO TABS: 1.0000 g | ORAL_TABLET | Freq: Two times a day (BID) | ORAL | 0 refills | Status: DC

## 2022-06-05 NOTE — ED Triage Notes (Signed)
Pt states coming in for acid reflex, high blood pressure and headaches. Pt states symptoms started this past Friday. Pt states BP at home 150/100 (s).  Pt denies chest pain.

## 2022-06-05 NOTE — ED Provider Notes (Signed)
[facility] Provider Note    Event Date/Time   First MD Initiated Contact with Patient 06/05/22 1203     (approximate)   History   Chief Complaint Hypertension   HPI Kahlen Morais is a 72 y.o. male, history of hypertension, pulmonary embolism, DDD, hyperlipidemia, presents to the emergency department for evaluation of hypertension, acid reflux, and headaches.  He states that for the past 3 days, he has had blood pressure at home around 150/100.  He states that his blood pressure is normally 130/90.  In addition, he has had sinus congestion, acid reflux, and intermittent headaches.  He has been taking Flonase and Afrin for the sinus congestion for the past 3 days as well.  Denies fever/chills, chest pain, shortness of breath, abdominal pain, flank pain, nausea/vomiting, diarrhea, urinary symptoms, weakness, restless lesions, or dizziness/lightheadedness.  History Limitations: No limitations.        Physical Exam  Triage Vital Signs: ED Triage Vitals  Enc Vitals Group     BP 06/05/22 1148 (!) 149/92     Pulse Rate 06/05/22 1148 88     Resp 06/05/22 1148 20     Temp 06/05/22 1148 98 F (36.7 C)     Temp Source 06/05/22 1148 Oral     SpO2 06/05/22 1148 97 %     Weight 06/05/22 1152 211 lb (95.7 kg)     Height 06/05/22 1152 '5\' 4"'$  (1.626 m)     Head Circumference --      Peak Flow --      Pain Score 06/05/22 1152 6     Pain Loc --      Pain Edu? --      Excl. in [place]? --     Most recent vital signs: Vitals:   06/05/22 1148  BP: (!) 149/92  Pulse: 88  Resp: 20  Temp: 98 F (36.7 C)  SpO2: 97%    General: Awake, NAD.  Skin: Warm, dry. No rashes or lesions.  Eyes: PERRL. Conjunctivae normal.  CV: Good peripheral perfusion.  S1 and S2 present, no murmurs, rubs, or gallops. Resp: Normal effort.  Lung sounds clear bilaterally. Abd: Soft, non-tender. No distention.  Neuro: At baseline. No gross neurological deficits.  Musculoskeletal: Normal  ROM of all extremities.  Physical Exam    ED Results / Procedures / Treatments  Labs (all labs ordered are listed, but only abnormal results are displayed) Labs Reviewed  RESP PANEL BY RT-PCR (RSV, FLU A&B, COVID)  [id number]     EKG Sinus rhythm, rate of 73, no ST segment changes, normal QRS, no QT prolongation, no significant axis deviations.    RADIOLOGY  ED Provider Interpretation: N/A.  No results found.  PROCEDURES:  Critical Care performed: N/A.  Procedures    MEDICATIONS ORDERED IN ED: Medications - No data to display   IMPRESSION / MDM / [place] / ED COURSE  I reviewed the triage vital signs and the nursing notes.                              Differential diagnosis includes, but is not limited to, influenza, COVID-19, RSV, hypertension, GERD.  Assessment/Plan Patient presents for concern of hypertension, GERD, and intermittent headaches.  His blood pressure here today is 149/92.  He currently takes combination pill of amlodipine and benazepril at home and has not missed any dosages.  I suspect that his mild increase in blood  pressure is likely related to the oxymetazoline use at home.  Advised him to discontinue this medication, that he may continue the Flonase for his sinus congestion.  Will test for COVID-19/influenza, though unlikely to change management at this time.  No signs of any lower respiratory symptoms.  Advised him to follow-up with the results online and avoid others if he test positive for influenza or COVID-19.  He was amenable to this.  In regards to his acid reflux, he describes as a heartburn type sensation, consistent with GERD.  Will provide with prescription for omeprazole and sucralfate.  I suspect that his headaches are related to viral infection versus mild increase in blood pressure.  Will provide him with a prescription for acetaminophen.  Recommended that he follow-up with his primary care provider as needed.  Will  discharge.  Provided the patient with anticipatory guidance, return precautions, and educational material. Encouraged the patient to return to the emergency department at any time if they begin to experience any new or worsening symptoms. Patient expressed understanding and agreed with the plan.   Patient's presentation is most consistent with acute complicated illness / injury requiring diagnostic workup.       FINAL CLINICAL IMPRESSION(S) / ED DIAGNOSES   Final diagnoses:  Hypertension, unspecified type  Gastroesophageal reflux disease without esophagitis     Rx / DC Orders   ED Discharge Orders          Ordered    omeprazole (PRILOSEC OTC) 20 MG tablet  Daily        06/05/22 1317    sucralfate (CARAFATE) 1 g tablet  2 times daily        06/05/22 1317    acetaminophen (TYLENOL) 500 MG tablet  Every 6 hours PRN        06/05/22 1317             Note:  This document was prepared using Dragon voice recognition software and may include unintentional dictation errors.   Teodoro Spray, [place] 06/05/22 1322    Blake Divine, MD 06/05/22 [phone]

## 2022-06-05 NOTE — Discharge Instructions (Addendum)
-  I suspect that your mild increase in blood pressure is related to the Afrin that you are using.  Please discontinue this medication.  You may continue the Flonase spray as needed.  -For your acid reflux, please take the omeprazole and sucralfate as prescribed.  Follow-up with your primary care provider as needed.  -Is possible you may have a viral upper respiratory infection.  You may follow-up with the results of the COVID-19 or influenza testing online.  If positive, please avoid others to prevent further spread of the virus.  -Return to the emergency department anytime if you begin to experience any new or worsening symptoms.

## 2022-06-15 ENCOUNTER — Other Ambulatory Visit (INDEPENDENT_AMBULATORY_CARE_PROVIDER_SITE_OTHER): Payer: Self-pay | Admitting: Nurse Practitioner

## 2022-06-15 DIAGNOSIS — M79604 Pain in right leg: Secondary | ICD-10-CM

## 2022-06-19 DIAGNOSIS — M79606 Pain in leg, unspecified: Secondary | ICD-10-CM | POA: Insufficient documentation

## 2022-06-19 NOTE — Progress Notes (Signed)
MRN : [id number]  Vincent Cole is a 72 y.o. (Jan 23, 1951) male who presents with chief complaint of check circulation.  History of Present Illness:   The patient is seen for evaluation of painful lower extremities. Patient notes the pain is variable and not always associated with activity.  He is describing pain that begins in his lower back and then radiates around the hip down the lateral thigh across the knee and into the middle toes.  The pain is a burning numbness and pins-and-needles type pain.  He also notes that it frequently occurs when he lays down.  The pain is somewhat consistent day to day occurring on most days. The patient notes the pain also occurs with standing and routinely seems worse as the day wears on. The pain has been progressive over the past several years. The patient states these symptoms are causing  a negative impact on quality of life and daily activities which was a factor in the referral.  The patient has a  history of back problems and DJD of the lumbar and sacral spine.   The patient denies rest pain or dangling of an extremity off the side of the bed during the night for relief. No open wounds or sores at this time. No history of DVT or phlebitis. No prior vascular interventions or surgeries.  ABI's are normal bilaterally  No outpatient medications have been marked as taking for the 06/20/22 encounter (Appointment) with Delana Meyer, Dolores Lory, MD.   Current Facility-Administered Medications for the 06/20/22 encounter (Appointment) with Delana Meyer, Dolores Lory, MD  Medication   betamethasone acetate-betamethasone sodium phosphate (CELESTONE) injection 3 mg    Past Medical History:  Diagnosis Date   Actinic keratosis    Arthritis    Benign bladder mass    Complication of anesthesia    PONV x 1 in 2010   Depression    Frequency of urination    GERD (gastroesophageal reflux disease) no meds   Heart murmur    was told many years ago    History of recurrent UTIs    Hypertension    IC (interstitial cystitis)    Nocturia    OSA on CPAP nightly   Pelvic pain in male    PONV (postoperative nausea and vomiting)    Simple renal cyst    Urgency of urination    Urinary hesitancy     Past Surgical History:  Procedure Laterality Date   BUNIONECTOMY Right 01/15/2021   Procedure: Jackalyn Lombard;  Surgeon: [name], DPM;  Location: [facility];  Service: Podiatry;  Laterality: Right;   COLONOSCOPY     COLONOSCOPY WITH PROPOFOL N/A 06/25/2021   Procedure: COLONOSCOPY WITH PROPOFOL;  Surgeon: Lesly Rubenstein, MD;  Location: [facility];  Service: Endoscopy;  Laterality: N/A;   CORRECTION OVERLAPPING TOES Right 01/15/2021   Procedure: CORRECTION OVERLAPPING TOES;  Surgeon: [name], DPM;  Location: [facility];  Service: Podiatry;  Laterality: Right;   CYSTO WITH HYDRODISTENSION  06/02/2011   Procedure: CYSTOSCOPY/HYDRODISTENSION;  Surgeon: Reece Packer, MD;  Location: [facility];  Service: Urology;  Laterality: N/A;  catheter placement   HAMMER TOE SURGERY Right 01/15/2021   Procedure: HAMMER TOE CORRECTION;  Surgeon: [name], DPM;  Location: [facility];  Service: Podiatry;  Laterality: Right;   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   JOINT REPLACEMENT     shoulder, left knee  KNEE ARTHROSCOPY     TRANSURETHRAL RESECTION OF PROSTATE  X3   LAST ONE MAY 2012   WEIL OSTEOTOMY Right 01/15/2021   Procedure: WEIL;  Surgeon: [name], DPM;  Location: [facility];  Service: Podiatry;  Laterality: Right;    Social History Social History   Tobacco Use   Smoking status: Never   Smokeless tobacco: Never  Vaping Use   Vaping Use: Never used  Substance Use Topics   Alcohol use: Yes    Comment: occ   Drug use: No    Family History Family History  Problem Relation Age of Onset   Heart disease Mother     No Known Allergies   REVIEW OF SYSTEMS (Negative unless  checked)  Constitutional: '[]'$ Weight loss  '[]'$ Fever  '[]'$ Chills Cardiac: '[]'$ Chest pain   '[]'$ Chest pressure   '[]'$ Palpitations   '[]'$ Shortness of breath when laying flat   '[]'$ Shortness of breath with exertion. Vascular:  '[x]'$ Pain in legs with walking   '[]'$ Pain in legs at rest  '[]'$ History of DVT   '[]'$ Phlebitis   '[]'$ Swelling in legs   '[]'$ Varicose veins   '[]'$ Non-healing ulcers Pulmonary:   '[]'$ Uses home oxygen   '[]'$ Productive cough   '[]'$ Hemoptysis   '[]'$ Wheeze  '[]'$ COPD   '[]'$ Asthma Neurologic:  '[]'$ Dizziness   '[]'$ Seizures   '[]'$ History of stroke   '[]'$ History of TIA  '[]'$ Aphasia   '[]'$ Vissual changes   '[]'$ Weakness or numbness in arm   '[x]'$ Weakness or numbness in leg Musculoskeletal:   '[]'$ Joint swelling   '[x]'$ Joint pain   '[x]'$ Low back pain Hematologic:  '[]'$ Easy bruising  '[]'$ Easy bleeding   '[]'$ Hypercoagulable state   '[]'$ Anemic Gastrointestinal:  '[]'$ Diarrhea   '[]'$ Vomiting  '[]'$ Gastroesophageal reflux/heartburn   '[]'$ Difficulty swallowing. Genitourinary:  '[]'$ Chronic kidney disease   '[]'$ Difficult urination  '[]'$ Frequent urination   '[]'$ Blood in urine Skin:  '[]'$ Rashes   '[]'$ Ulcers  Psychological:  '[]'$ History of anxiety   '[]'$  History of major depression.  Physical Examination  There were no vitals filed for this visit. There is no height or weight on file to calculate BMI. Gen: WD/WN, NAD Head: [place]/AT, No temporalis wasting.  Ear/Nose/Throat: Hearing grossly intact, nares w/o erythema or drainage Eyes: PER, EOMI, sclera nonicteric.  Neck: Supple, no masses.  No bruit or JVD.  Pulmonary:  Good air movement, no audible wheezing, no use of accessory muscles.  Cardiac: RRR, normal S1, S2, no Murmurs. Vascular:  mild trophic changes, no open wounds Vessel Right Left  Radial Palpable Palpable  PT Palpable Palpable  DP Palpable Palpable  Gastrointestinal: soft, non-distended. No guarding/no peritoneal signs.  Musculoskeletal: M/S 5/5 throughout.  No visible deformity.  Neurologic: CN 2-12 intact. Pain and light touch intact in extremities.  Symmetrical.  Speech is  fluent. Motor exam as listed above. Psychiatric: Judgment intact, Mood & affect appropriate for pt's clinical situation. Dermatologic: No rashes or ulcers noted.  No changes consistent with cellulitis.   CBC Lab Results  Component Value Date   WBC 9.0 01/07/2021   HGB 15.8 01/07/2021   HCT 46.8 01/07/2021   MCV 89.1 01/07/2021   PLT 308 01/07/2021    BMET    Component Value Date/Time   NA 137 01/07/2021 2328   K 3.8 01/07/2021 2328   CL 105 01/07/2021 2328   CO2 22 01/07/2021 2328   GLUCOSE 108 (H) 01/07/2021 2328   BUN 19 01/07/2021 2328   CREATININE 1.18 01/07/2021 2328   CALCIUM 9.2 01/07/2021 2328   GFRNONAA >60 01/07/2021 2328   GFRAA >60 11/23/2019 2054   CrCl  cannot be calculated (Patient's most recent lab result is older than the maximum 21 days allowed.).  COAG No results found for: "INR", "PROTIME"  Radiology No results found.   Assessment/Plan 1. Pain in both lower extremities Recommend:  The patient has atypical pain symptoms for vascular disease and on exam I do not find evidence of vascular pathology that would explain the patient's symptoms.  Noninvasive studies do not identify significant vascular problems  I suspect the patient is c/o pseudoclaudication.  Patient should have an evaluation of the LS spine which I defer to the Spine service.  The patient should continue walking and begin a more formal exercise program. The patient should continue his antiplatelet therapy and aggressive treatment of the lipid abnormalities.  Patient will follow-up with me on a PRN basis. - Ambulatory referral to Neurosurgery  2. DDD (degenerative disc disease), lumbosacral Recommend:  The patient has atypical pain symptoms for vascular disease and on exam I do not find evidence of vascular pathology that would explain the patient's symptoms.  Noninvasive studies do not identify significant vascular problems  I suspect the patient is c/o pseudoclaudication.   Patient should have an evaluation of the LS spine which I defer to the primary service or the Spine service.  The patient should continue walking and begin a more formal exercise program. The patient should continue his antiplatelet therapy and aggressive treatment of the lipid abnormalities.  Patient will follow-up with me on a PRN basis. - Ambulatory referral to Neurosurgery  3. Primary hypertension Continue antihypertensive medications as already ordered, these medications have been reviewed and there are no changes at this time.  4. Mixed hyperlipidemia Continue statin as ordered and reviewed, no changes at this time    Hortencia Pilar, MD  06/19/2022 12:46 PM

## 2022-06-20 ENCOUNTER — Ambulatory Visit (INDEPENDENT_AMBULATORY_CARE_PROVIDER_SITE_OTHER): Payer: Medicare Other | Admitting: Vascular Surgery

## 2022-06-20 ENCOUNTER — Ambulatory Visit (INDEPENDENT_AMBULATORY_CARE_PROVIDER_SITE_OTHER): Payer: Medicare Other

## 2022-06-20 ENCOUNTER — Encounter (INDEPENDENT_AMBULATORY_CARE_PROVIDER_SITE_OTHER): Payer: Self-pay | Admitting: Vascular Surgery

## 2022-06-20 VITALS — BP 134/82 | HR 60 | Ht 64.0 in | Wt 215.0 lb

## 2022-06-20 DIAGNOSIS — M5137 Other intervertebral disc degeneration, lumbosacral region: Secondary | ICD-10-CM | POA: Diagnosis not present

## 2022-06-20 DIAGNOSIS — M79604 Pain in right leg: Secondary | ICD-10-CM

## 2022-06-20 DIAGNOSIS — M79605 Pain in left leg: Secondary | ICD-10-CM

## 2022-06-20 DIAGNOSIS — I10 Essential (primary) hypertension: Secondary | ICD-10-CM | POA: Diagnosis not present

## 2022-06-20 DIAGNOSIS — E782 Mixed hyperlipidemia: Secondary | ICD-10-CM | POA: Diagnosis not present

## 2022-06-20 LAB — VAS US ABI WITH/WO TBI
Left ABI: 1.26
Right ABI: 1.25

## 2022-06-25 ENCOUNTER — Encounter (INDEPENDENT_AMBULATORY_CARE_PROVIDER_SITE_OTHER): Payer: Self-pay | Admitting: Vascular Surgery

## 2022-07-04 ENCOUNTER — Other Ambulatory Visit: Payer: Self-pay | Admitting: Family

## 2022-07-04 NOTE — Progress Notes (Signed)
Patient presented with sinus congestion and headaches in addition to his complaint of hypertension. These are symptoms of influenza/COVID/RSV.

## 2022-07-22 ENCOUNTER — Encounter: Payer: Self-pay | Admitting: Internal Medicine

## 2022-07-22 ENCOUNTER — Ambulatory Visit (INDEPENDENT_AMBULATORY_CARE_PROVIDER_SITE_OTHER): Payer: Medicare Other | Admitting: Internal Medicine

## 2022-07-22 VITALS — BP 122/70 | HR 90 | Ht 65.0 in | Wt 218.0 lb

## 2022-07-22 DIAGNOSIS — E782 Mixed hyperlipidemia: Secondary | ICD-10-CM | POA: Diagnosis not present

## 2022-07-22 DIAGNOSIS — I10 Essential (primary) hypertension: Secondary | ICD-10-CM | POA: Diagnosis not present

## 2022-07-22 DIAGNOSIS — M47816 Spondylosis without myelopathy or radiculopathy, lumbar region: Secondary | ICD-10-CM

## 2022-07-22 DIAGNOSIS — M5137 Other intervertebral disc degeneration, lumbosacral region: Secondary | ICD-10-CM

## 2022-07-22 DIAGNOSIS — K219 Gastro-esophageal reflux disease without esophagitis: Secondary | ICD-10-CM | POA: Insufficient documentation

## 2022-07-22 DIAGNOSIS — G4733 Obstructive sleep apnea (adult) (pediatric): Secondary | ICD-10-CM

## 2022-07-22 DIAGNOSIS — N401 Enlarged prostate with lower urinary tract symptoms: Secondary | ICD-10-CM | POA: Diagnosis not present

## 2022-07-22 DIAGNOSIS — N138 Other obstructive and reflux uropathy: Secondary | ICD-10-CM

## 2022-07-22 NOTE — Progress Notes (Signed)
Established Patient Office Visit  Subjective:  Patient ID: Vincent Cole, male    DOB: [birth year]  Age: 72 y.o. MRN: [id number]  Chief Complaint  Patient presents with   Follow-up    3 month follow up    Patient comes in for his follow-up today.  He complains of his back pain and a pinched nerve for which she is getting regular physical therapy.  He occasionally notices swelling in his legs at night when he has been up and around all day.  He is currently on Lasix.  Patient advised to start wearing compression stockings during daytime.  He does not have any shortness of breath no chest pain and no palpitations.   Patient will return fasting for labs.     Past Medical History:  Diagnosis Date   Actinic keratosis    Arthritis    Benign bladder mass    Complication of anesthesia    PONV x 1 in 2010   Depression    Frequency of urination    GERD (gastroesophageal reflux disease) no meds   Heart murmur    was told many years ago   History of recurrent UTIs    Hypertension    IC (interstitial cystitis)    Nocturia    OSA on CPAP nightly   Pelvic pain in male    PONV (postoperative nausea and vomiting)    Simple renal cyst    Urgency of urination    Urinary hesitancy    Past Surgical History:  Procedure Laterality Date   BUNIONECTOMY Right 01/15/2021   Procedure: Jackalyn Lombard;  Surgeon: [name], DPM;  Location: [facility];  Service: Podiatry;  Laterality: Right;   COLONOSCOPY     COLONOSCOPY WITH PROPOFOL N/A 06/25/2021   Procedure: COLONOSCOPY WITH PROPOFOL;  Surgeon: Lesly Rubenstein, MD;  Location: [facility];  Service: Endoscopy;  Laterality: N/A;   CORRECTION OVERLAPPING TOES Right 01/15/2021   Procedure: CORRECTION OVERLAPPING TOES;  Surgeon: [name], DPM;  Location: [facility];  Service: Podiatry;  Laterality: Right;   CYSTO WITH HYDRODISTENSION  06/02/2011   Procedure: CYSTOSCOPY/HYDRODISTENSION;  Surgeon: Reece Packer, MD;   Location: [facility];  Service: Urology;  Laterality: N/A;  catheter placement   HAMMER TOE SURGERY Right 01/15/2021   Procedure: HAMMER TOE CORRECTION;  Surgeon: [name], DPM;  Location: [facility];  Service: Podiatry;  Laterality: Right;   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   JOINT REPLACEMENT     shoulder, left knee   KNEE ARTHROSCOPY     TRANSURETHRAL RESECTION OF PROSTATE  X3   LAST ONE MAY 2012   WEIL OSTEOTOMY Right 01/15/2021   Procedure: WEIL;  Surgeon: [name], DPM;  Location: [facility];  Service: Podiatry;  Laterality: Right;    Social History   Socioeconomic History   Marital status: Married    Spouse name: Not on file   Number of children: 1   Years of education: Not on file   Highest education level: Not on file  Occupational History   Not on file  Tobacco Use   Smoking status: Never   Smokeless tobacco: Never  Vaping Use   Vaping Use: Never used  Substance and Sexual Activity   Alcohol use: Yes    Comment: occ   Drug use: No   Sexual activity: Not on file  Other Topics Concern   Not on file  Social History Narrative   Not on file   Social Determinants  of Health   Financial Resource Strain: Not on file  Food Insecurity: Not on file  Transportation Needs: Not on file  Physical Activity: Not on file  Stress: Not on file  Social Connections: Not on file  Intimate Partner Violence: Not on file    Family History  Problem Relation Age of Onset   Heart disease Mother     No Known Allergies  Review of Systems  Constitutional:  Negative for chills, diaphoresis, fever, malaise/fatigue and weight loss.  HENT: Negative.    Eyes: Negative.   Respiratory: Negative.    Cardiovascular: Negative.   Gastrointestinal: Negative.   Genitourinary: Negative.   Musculoskeletal: Negative.   Neurological: Negative.   Psychiatric/Behavioral:  Negative for depression. The patient is not nervous/anxious and does not have insomnia.         Objective:   BP 122/70   Pulse 90   Ht '5\' 5"'$  (1.651 m)   Wt 218 lb (98.9 kg)   SpO2 96%   BMI 36.28 kg/m   Vitals:   07/22/22 1003  BP: 122/70  Pulse: 90  Height: '5\' 5"'$  (1.651 m)  Weight: 218 lb (98.9 kg)  SpO2: 96%  BMI (Calculated): 36.28    Physical Exam Constitutional:      Appearance: Normal appearance. He is obese.  HENT:     Head: Normocephalic.  Cardiovascular:     Rate and Rhythm: Normal rate and regular rhythm.  Pulmonary:     Effort: Pulmonary effort is normal.     Breath sounds: Normal breath sounds.  Abdominal:     General: Abdomen is flat. Bowel sounds are normal.     Palpations: Abdomen is soft.  Musculoskeletal:        General: Normal range of motion.     Cervical back: Normal range of motion and neck supple.  Skin:    General: Skin is warm and dry.  Neurological:     General: No focal deficit present.     Mental Status: He is alert and oriented to person, place, and time.      No results found for any visits on 07/22/22.      Assessment & Plan:  Patient advised to continue taking all his medications regularly.  And he is to start wearing compression stockings during daytime.  Labs will be done fasting. Problem List Items Addressed This Visit     Essential hypertension, benign   Relevant Medications   furosemide (LASIX) 20 MG tablet   rosuvastatin (CRESTOR) 10 MG tablet   Other Relevant Orders   CMP14+EGFR   Benign prostatic hyperplasia with urinary obstruction   Relevant Orders   PSA   DDD (degenerative disc disease), lumbosacral   Hyperlipidemia - Primary   Relevant Medications   furosemide (LASIX) 20 MG tablet   rosuvastatin (CRESTOR) 10 MG tablet   Other Relevant Orders   Lipid Panel w/o Chol/HDL Ratio   Lumbar spondylosis   OSA (obstructive sleep apnea)   Gastroesophageal reflux disease without esophagitis   Relevant Orders   CBC With Differential    Return in about 3 months (around 10/22/2022).   Total time  spent: 30 minutes  Perrin Maltese, MD  07/22/2022

## 2022-08-04 ENCOUNTER — Ambulatory Visit (INDEPENDENT_AMBULATORY_CARE_PROVIDER_SITE_OTHER): Payer: Medicare Other | Admitting: Internal Medicine

## 2022-08-04 ENCOUNTER — Other Ambulatory Visit: Payer: Medicare Other

## 2022-08-04 ENCOUNTER — Encounter: Payer: Self-pay | Admitting: Internal Medicine

## 2022-08-04 DIAGNOSIS — M545 Low back pain, unspecified: Secondary | ICD-10-CM | POA: Diagnosis not present

## 2022-08-04 LAB — POCT URINALYSIS DIPSTICK
Bilirubin, UA: NEGATIVE
Glucose, UA: NEGATIVE
Ketones, UA: NEGATIVE
Leukocytes, UA: NEGATIVE
Nitrite, UA: NEGATIVE
Protein, UA: NEGATIVE
Spec Grav, UA: 1.01 (ref 1.010–1.025)
Urobilinogen, UA: 0.2 E.U./dL
pH, UA: 7 (ref 5.0–8.0)

## 2022-08-04 NOTE — Progress Notes (Signed)
Pt just came in for urine test.

## 2022-08-05 LAB — CBC WITH DIFFERENTIAL
Basophils Absolute: 0 10*3/uL (ref 0.0–0.2)
Basos: 1 %
EOS (ABSOLUTE): 0.1 10*3/uL (ref 0.0–0.4)
Eos: 2 %
Hematocrit: 40.7 % (ref 37.5–51.0)
Hemoglobin: 14.3 g/dL (ref 13.0–17.7)
Immature Grans (Abs): 0 10*3/uL (ref 0.0–0.1)
Immature Granulocytes: 0 %
Lymphocytes Absolute: 1.1 10*3/uL (ref 0.7–3.1)
Lymphs: 22 %
MCH: 30.8 pg (ref 26.6–33.0)
MCHC: 35.1 g/dL (ref 31.5–35.7)
MCV: 88 fL (ref 79–97)
Monocytes Absolute: 0.6 10*3/uL (ref 0.1–0.9)
Monocytes: 11 %
Neutrophils Absolute: 3.1 10*3/uL (ref 1.4–7.0)
Neutrophils: 64 %
RBC: 4.65 x10E6/uL (ref 4.14–5.80)
RDW: 13.2 % (ref 11.6–15.4)
WBC: 4.9 10*3/uL (ref 3.4–10.8)

## 2022-08-05 LAB — CMP14+EGFR
ALT: 20 IU/L (ref 0–44)
AST: 23 IU/L (ref 0–40)
Albumin/Globulin Ratio: 1.9 (ref 1.2–2.2)
Albumin: 4.2 g/dL (ref 3.8–4.8)
Alkaline Phosphatase: 104 IU/L (ref 44–121)
BUN/Creatinine Ratio: 11 (ref 10–24)
BUN: 10 mg/dL (ref 8–27)
Bilirubin Total: 0.5 mg/dL (ref 0.0–1.2)
CO2: 23 mmol/L (ref 20–29)
Calcium: 9.4 mg/dL (ref 8.6–10.2)
Chloride: 96 mmol/L (ref 96–106)
Creatinine, Ser: 0.9 mg/dL (ref 0.76–1.27)
Globulin, Total: 2.2 g/dL (ref 1.5–4.5)
Glucose: 87 mg/dL (ref 70–99)
Potassium: 4.7 mmol/L (ref 3.5–5.2)
Sodium: 133 mmol/L — ABNORMAL LOW (ref 134–144)
Total Protein: 6.4 g/dL (ref 6.0–8.5)
eGFR: 90 mL/min/{1.73_m2} (ref >59)

## 2022-08-05 LAB — LIPID PANEL W/O CHOL/HDL RATIO
Cholesterol, Total: 210 mg/dL — ABNORMAL HIGH (ref 100–199)
HDL: 89 mg/dL (ref >39)
LDL Chol Calc (NIH): 107 mg/dL — ABNORMAL HIGH (ref 0–99)
Triglycerides: 79 mg/dL (ref 0–149)
VLDL Cholesterol Cal: 14 mg/dL (ref 5–40)

## 2022-08-05 LAB — PSA: Prostate Specific Ag, Serum: 0.8 ng/mL (ref 0.0–4.0)

## 2022-08-20 ENCOUNTER — Other Ambulatory Visit: Payer: Self-pay | Admitting: Internal Medicine

## 2022-08-21 ENCOUNTER — Other Ambulatory Visit: Payer: Self-pay | Admitting: Family

## 2022-08-23 NOTE — Telephone Encounter (Signed)
I have discussed this plan of care with Dr. Neelam Khan  

## 2022-08-29 ENCOUNTER — Other Ambulatory Visit: Payer: Self-pay | Admitting: Internal Medicine

## 2022-09-05 ENCOUNTER — Other Ambulatory Visit: Payer: Self-pay | Admitting: Family

## 2022-09-05 ENCOUNTER — Other Ambulatory Visit: Payer: Self-pay | Admitting: Internal Medicine

## 2022-09-05 DIAGNOSIS — M79604 Pain in right leg: Secondary | ICD-10-CM

## 2022-09-08 ENCOUNTER — Other Ambulatory Visit: Payer: Self-pay

## 2022-09-14 MED ORDER — LORAZEPAM 0.5 MG PO TABS: ORAL_TABLET | ORAL | 1 refills | Status: DC

## 2022-09-21 ENCOUNTER — Other Ambulatory Visit: Payer: Self-pay | Admitting: Internal Medicine

## 2022-09-21 DIAGNOSIS — I10 Essential (primary) hypertension: Secondary | ICD-10-CM

## 2022-10-11 DIAGNOSIS — Z96659 Presence of unspecified artificial knee joint: Secondary | ICD-10-CM | POA: Insufficient documentation

## 2022-11-09 ENCOUNTER — Ambulatory Visit (INDEPENDENT_AMBULATORY_CARE_PROVIDER_SITE_OTHER): Payer: Medicare Other | Admitting: Dermatology

## 2022-11-09 VITALS — BP 144/88 | HR 78

## 2022-11-09 DIAGNOSIS — Z7189 Other specified counseling: Secondary | ICD-10-CM

## 2022-11-09 DIAGNOSIS — L570 Actinic keratosis: Secondary | ICD-10-CM

## 2022-11-09 DIAGNOSIS — L578 Other skin changes due to chronic exposure to nonionizing radiation: Secondary | ICD-10-CM

## 2022-11-09 DIAGNOSIS — L219 Seborrheic dermatitis, unspecified: Secondary | ICD-10-CM

## 2022-11-09 DIAGNOSIS — W908XXA Exposure to other nonionizing radiation, initial encounter: Secondary | ICD-10-CM | POA: Diagnosis not present

## 2022-11-09 DIAGNOSIS — Z79899 Other long term (current) drug therapy: Secondary | ICD-10-CM

## 2022-11-09 DIAGNOSIS — D692 Other nonthrombocytopenic purpura: Secondary | ICD-10-CM

## 2022-11-09 DIAGNOSIS — L814 Other melanin hyperpigmentation: Secondary | ICD-10-CM

## 2022-11-09 MED ORDER — KETOCONAZOLE 2 % EX CREA: TOPICAL_CREAM | CUTANEOUS | 6 refills | Status: DC

## 2022-11-09 NOTE — Patient Instructions (Addendum)
FOR BRUISES on arms  Purpura - Chronic; persistent and recurrent.  Treatable, but not curable. - Violaceous macules and patches - Benign - Related to trauma, age, sun damage and/or use of blood thinners, chronic use of topical and/or oral steroids - Observe - Can use OTC arnica containing moisturizer such as Dermend Bruise Formula if desired - Call for worsening or other concerns     Cryotherapy Aftercare  Wash gently with soap and water everyday.   Apply Vaseline and Band-Aid daily until healed.     Due to recent changes in healthcare laws, you may see results of your pathology and/or laboratory studies on MyChart before the doctors have had a chance to review them. We understand that in some cases there may be results that are confusing or concerning to you. Please understand that not all results are received at the same time and often the doctors may need to interpret multiple results in order to provide you with the best plan of care or course of treatment. Therefore, we ask that you please give [place] 2 business days to thoroughly review all your results before contacting the office for clarification. Should we see a critical lab result, you will be contacted sooner.   If You Need Anything After Your Visit  If you have any questions or concerns for your doctor, please call our main line at [phone] and press option 4 to reach your doctor's medical assistant. If no one answers, please leave a voicemail as directed and we will return your call as soon as possible. Messages left after 4 pm will be answered the following business day.   You may also send [place] a message via MyChart. We typically respond to MyChart messages within 1-2 business days.  For prescription refills, please ask your pharmacy to contact our office. Our fax number is [phone].  If you have an urgent issue when the clinic is closed that cannot wait until the next business day, you can page your doctor at the  number below.    Please note that while we do our best to be available for urgent issues outside of office hours, we are not available 24/7.   If you have an urgent issue and are unable to reach [place], you may choose to seek medical care at your doctor's office, retail clinic, urgent care center, or emergency room.  If you have a medical emergency, please immediately call 911 or go to the emergency department.  Pager Numbers  - Dr. Gwen Pounds: [phone]  - Dr. Neale Burly: [phone]  - Dr. Roseanne Reno: [phone]  In the event of inclement weather, please call our main line at [phone] for an update on the status of any delays or closures.  Dermatology Medication Tips: Please keep the boxes that topical medications come in in order to help keep track of the instructions about where and how to use these. Pharmacies typically print the medication instructions only on the boxes and not directly on the medication tubes.   If your medication is too expensive, please contact our office at [phone] option 4 or send [place] a message through MyChart.   We are unable to tell what your co-pay for medications will be in advance as this is different depending on your insurance coverage. However, we may be able to find a substitute medication at lower cost or fill out paperwork to get insurance to cover a needed medication.   If a prior authorization is required to get your medication covered by your  insurance company, please allow [place] 1-2 business days to complete this process.  Drug prices often vary depending on where the prescription is filled and some pharmacies may offer cheaper prices.  The website www.goodrx.com contains coupons for medications through different pharmacies. The prices here do not account for what the cost may be with help from insurance (it may be cheaper with your insurance), but the website can give you the price if you did not use any insurance.  - You can print the associated  coupon and take it with your prescription to the pharmacy.  - You may also stop by our office during regular business hours and pick up a GoodRx coupon card.  - If you need your prescription sent electronically to a different pharmacy, notify our office through [facility] or by phone at [phone] option 4.     Si Usted Necesita Algo Despus de Su Visita  Tambin puede enviarnos un mensaje a travs de Clinical cytogeneticist. Por lo general respondemos a los mensajes de MyChart en el transcurso de 1 a 2 das hbiles.  Para renovar recetas, por favor pida a su farmacia que se ponga en contacto con nuestra oficina. Annie Sable de fax es [place] [phone].  Si tiene un asunto urgente cuando la clnica est cerrada y que no puede esperar hasta el siguiente da hbil, puede llamar/localizar a su doctor(a) al nmero que aparece a continuacin.   Por favor, tenga en cuenta que aunque hacemos todo lo posible para estar disponibles para asuntos urgentes fuera del horario de [place], no estamos disponibles las 24 horas del da, los 7 [address]  [address] de la [place].   Si tiene un problema urgente y no puede comunicarse con nosotros, puede optar por buscar atencin mdica  en el consultorio de su doctor(a), en una clnica privada, en un centro de atencin urgente o en una sala de emergencias.  Si tiene Engineer, drilling, por favor llame inmediatamente al 911 o vaya a la sala de emergencias.  Nmeros de bper  - Dr. Gwen Pounds: [phone]  - Dra. Moye: [phone]  - Dra. Roseanne Reno: [phone]  En caso de inclemencias del [place], por favor llame a Lacy Duverney principal al [phone] para una actualizacin sobre el [place] de cualquier retraso o cierre.  Consejos para la medicacin en dermatologa: Por favor, guarde las cajas en las que vienen los medicamentos de uso tpico para ayudarle a seguir las instrucciones sobre dnde y cmo usarlos. Las farmacias generalmente imprimen las instrucciones del  medicamento slo en las cajas y no directamente en los tubos del [place].   Si su medicamento es muy caro, por favor, pngase en contacto con Rolm Gala llamando al [phone] y presione la opcin 4 o envenos un mensaje a travs de Clinical cytogeneticist.   No podemos decirle cul ser su copago por los medicamentos por adelantado ya que esto es diferente dependiendo de la cobertura de su seguro. Sin embargo, es posible que podamos encontrar un medicamento sustituto a Audiological scientist un formulario para que el seguro cubra el medicamento que se considera necesario.   Si se requiere una autorizacin previa para que su compaa de seguros [place] su medicamento, por favor permtanos de 1 a 2 das hbiles para completar [address]  Los precios de los medicamentos varan con frecuencia dependiendo del Environmental consultant de dnde se surte la receta y alguna farmacias pueden ofrecer precios ms baratos.  El sitio web www.goodrx.com tiene cupones para medicamentos de Health and safety inspector. Los precios aqu no tienen en  cuenta lo que podra costar con la ayuda del seguro (puede ser ms barato con su seguro), pero el sitio web puede darle el precio si no Field seismologist.  - Puede imprimir el cupn correspondiente y llevarlo con su receta a la farmacia.  - Tambin puede pasar por nuestra oficina durante el horario de atencin regular y Charity fundraiser una tarjeta de cupones de GoodRx.  - Si necesita que su receta se enve electrnicamente a una farmacia diferente, informe a nuestra oficina a travs de MyChart de [place] o por telfono llamando al [phone] y presione la opcin 4.

## 2022-11-09 NOTE — Progress Notes (Signed)
Follow-Up Visit   Subjective  Vincent Cole is a 72 y.o. male who presents for the following: 6 months f/u on precancers on his scalp, treated with 5FU/Calcipotriene cream ~3 months ago with a good response. Patient c/o rash on his eyebrows that will come and go. The patient has spots, moles and lesions to be evaluated, some may be new or changing and the patient may have concern these could be cancer.  The following portions of the chart were reviewed this encounter and updated as appropriate: medications, allergies, medical history  Review of Systems:  No other skin or systemic complaints except as noted in HPI or Assessment and Plan.  Objective  Well appearing patient in no apparent distress; mood and affect are within normal limits. A focused examination was performed of the following areas:scalp,face Relevant exam findings are noted in the Assessment and Plan.  scalp,ears x 7 (7) Erythematous thin papules/macules with gritty scale.    Assessment & Plan   ACTINIC DAMAGE - chronic, secondary to cumulative UV radiation exposure/sun exposure over time - diffuse scaly erythematous macules with underlying dyspigmentation - Recommend daily broad spectrum sunscreen SPF 30+ to sun-exposed areas, reapply every 2 hours as needed.  - Recommend staying in the shade or wearing long sleeves, sun glasses (UVA+UVB protection) and wide brim hats (4-inch brim around the entire circumference of the hat). - Call for new or changing lesions.   Purpura - Chronic; persistent and recurrent.  Treatable, but not curable. - Violaceous macules and patches - Benign - Related to trauma, age, sun damage and/or use of blood thinners, chronic use of topical and/or oral steroids - Observe - Can use OTC arnica containing moisturizer such as Dermend Bruise Formula if desired - Call for worsening or other concerns   LENTIGINES Exam: scattered tan macules Due to sun exposure Treatment  Plan: Benign-appearing, observe. Recommend daily broad spectrum sunscreen SPF 30+ to sun-exposed areas, reapply every 2 hours as needed.  Call for any changes   AK (actinic keratosis) (7) scalp,ears x 7  Actinic keratoses are precancerous spots that appear secondary to cumulative UV radiation exposure/sun exposure over time. They are chronic with expected duration over 1 year. A portion of actinic keratoses will progress to squamous cell carcinoma of the skin. It is not possible to reliably predict which spots will progress to skin cancer and so treatment is recommended to prevent development of skin cancer.  Recommend daily broad spectrum sunscreen SPF 30+ to sun-exposed areas, reapply every 2 hours as needed.  Recommend staying in the shade or wearing long sleeves, sun glasses (UVA+UVB protection) and wide brim hats (4-inch brim around the entire circumference of the hat). Call for new or changing lesions.   Destruction of lesion - scalp,ears x 7 Complexity: simple   Destruction method: cryotherapy   Informed consent: discussed and consent obtained   Timeout:  patient name, date of birth, surgical site, and procedure verified Lesion destroyed using liquid nitrogen: Yes   Region frozen until ice ball extended beyond lesion: Yes   Outcome: patient tolerated procedure well with no complications   Post-procedure details: wound care instructions given    SEBORRHEIC DERMATITIS Exam: Pink patches with greasy scale at  eyebrows Chronic and persistent condition with duration or expected duration over one year. Condition is symptomatic/ bothersome to patient. Not currently at goal.  Seborrheic Dermatitis is a chronic persistent rash characterized by pinkness and scaling most commonly of the mid face but also can occur on the scalp (  dandruff), ears; mid chest, mid back and groin.  It tends to be exacerbated by stress and cooler weather.  People who have neurologic disease may experience new onset or  exacerbation of existing seborrheic dermatitis.  The condition is not curable but treatable and can be controlled.  Treatment Plan: Start Ketoconazole cream apply to affected skin at bedtime    No follow-ups on file.  IAngelique Holm, CMA, am acting as scribe for Armida Sans, MD .   Documentation: I have reviewed the above documentation for accuracy and completeness, and I agree with the above.  Armida Sans, MD

## 2022-11-10 ENCOUNTER — Other Ambulatory Visit: Payer: Self-pay | Admitting: Dermatology

## 2022-11-10 DIAGNOSIS — L219 Seborrheic dermatitis, unspecified: Secondary | ICD-10-CM

## 2022-11-11 ENCOUNTER — Encounter: Payer: Self-pay | Admitting: Dermatology

## 2022-11-21 ENCOUNTER — Other Ambulatory Visit: Payer: Self-pay | Admitting: Family

## 2022-11-21 ENCOUNTER — Ambulatory Visit: Payer: Medicare Other | Admitting: Internal Medicine

## 2022-11-27 ENCOUNTER — Other Ambulatory Visit: Payer: Self-pay | Admitting: Family

## 2022-11-30 ENCOUNTER — Ambulatory Visit (INDEPENDENT_AMBULATORY_CARE_PROVIDER_SITE_OTHER): Payer: Medicare Other | Admitting: Family

## 2022-11-30 DIAGNOSIS — R3989 Other symptoms and signs involving the genitourinary system: Secondary | ICD-10-CM | POA: Diagnosis not present

## 2022-11-30 LAB — POCT URINALYSIS DIPSTICK
Glucose, UA: NEGATIVE
Leukocytes, UA: NEGATIVE
Nitrite, UA: NEGATIVE
Protein, UA: NEGATIVE
Spec Grav, UA: 1.015 (ref 1.010–1.025)
Urobilinogen, UA: 0.2 E.U./dL
pH, UA: 6 (ref 5.0–8.0)

## 2022-11-30 NOTE — Progress Notes (Signed)
.  Acute Office Visit  Subjective:     Patient ID: Vincent Cole, male    DOB: July 11, 1950, 72 y.o.   MRN: [id number]  Patient is in today for  Chief Complaint  Patient presents with   Acute Visit    UA    Urinary Tract Infection  This is a new problem. The problem has been gradually worsening. The quality of the pain is described as burning and aching. He has tried nothing for the symptoms. The treatment provided no relief.     Review of Systems  Gastrointestinal:  Positive for abdominal pain.  Genitourinary:  Positive for dysuria.  All other systems reviewed and are negative.       Objective:    There were no vitals taken for this visit.   Results for orders placed or performed in visit on 11/30/22  POCT Urinalysis Dipstick (81002)  Result Value Ref Range   Color, UA     Clarity, UA     Glucose, UA Negative Negative   Bilirubin, UA -    Ketones, UA -    Spec Grav, UA 1.015 1.010 - 1.025   Blood, UA -    pH, UA 6.0 5.0 - 8.0   Protein, UA Negative Negative   Urobilinogen, UA 0.2 0.2 or 1.0 E.U./dL   Nitrite, UA negative    Leukocytes, UA Negative Negative   Appearance     Odor      Recent Results (from the past 2160 hour(s))  POCT Urinalysis Dipstick (69629)     Status: None   Collection Time: 11/30/22 10:57 AM  Result Value Ref Range   Color, UA     Clarity, UA     Glucose, UA Negative Negative   Bilirubin, UA -    Ketones, UA -    Spec Grav, UA 1.015 1.010 - 1.025   Blood, UA -    pH, UA 6.0 5.0 - 8.0   Protein, UA Negative Negative   Urobilinogen, UA 0.2 0.2 or 1.0 E.U./dL   Nitrite, UA negative    Leukocytes, UA Negative Negative   Appearance     Odor    Lipid panel     Status: Abnormal   Collection Time: 12/06/22 11:26 AM  Result Value Ref Range   Cholesterol, Total 211 (H) 100 - 199 mg/dL   Triglycerides 91 0 - 149 mg/dL   HDL 87 >52 mg/dL   VLDL Cholesterol Cal 16 5 - 40 mg/dL   LDL Chol Calc (NIH) 841 (H) 0 - 99 mg/dL   Chol/HDL  Ratio 2.4 0.0 - 5.0 ratio    Comment:                                   T. Chol/HDL Ratio                                             Men  Women                               1/2 Avg.Risk  3.4    3.3  Avg.Risk  5.0    4.4                                2X Avg.Risk  9.6    7.1                                3X Avg.Risk 23.4   11.0   VITAMIN D 25 Hydroxy (Vit-D Deficiency, Fractures)     Status: None   Collection Time: 12/06/22 11:26 AM  Result Value Ref Range   Vit D, 25-Hydroxy 61.0 30.0 - 100.0 ng/mL    Comment: Vitamin D deficiency has been defined by the Institute of Medicine and an Endocrine Society practice guideline as a level of serum 25-OH vitamin D less than 20 ng/mL (1,2). The Endocrine Society went on to further define vitamin D insufficiency as a level between 21 and 29 ng/mL (2). 1. IOM (Institute of Medicine). 2010. Dietary reference    intakes for calcium and D. [place]: The    [employer]. 2. Holick MF, Binkley Tolleson, Bischoff-Ferrari HA, et al.    Evaluation, treatment, and prevention of vitamin D    deficiency: an Endocrine Society clinical practice    guideline. JCEM. 2011 Jul; 96(7):1911-30.   CBC With Differential     Status: None   Collection Time: 12/06/22 11:26 AM  Result Value Ref Range   WBC 6.0 3.4 - 10.8 x10E3/uL   RBC 4.53 4.14 - 5.80 x10E6/uL   Hemoglobin 14.0 13.0 - 17.7 g/dL   Hematocrit 15.1 76.1 - 51.0 %   MCV 94 79 - 97 fL   MCH 30.9 26.6 - 33.0 pg   MCHC 33.0 31.5 - 35.7 g/dL   RDW 60.7 37.1 - 06.2 %   Neutrophils 67 Not Estab. %   Lymphs 20 Not Estab. %   Monocytes 10 Not Estab. %   Eos 2 Not Estab. %   Basos 1 Not Estab. %   Neutrophils Absolute 4.1 1.4 - 7.0 x10E3/uL   Lymphocytes Absolute 1.2 0.7 - 3.1 x10E3/uL   Monocytes Absolute 0.6 0.1 - 0.9 x10E3/uL   EOS (ABSOLUTE) 0.1 0.0 - 0.4 x10E3/uL   Basophils Absolute 0.0 0.0 - 0.2 x10E3/uL   Immature Granulocytes 0 Not Estab. %    Immature Grans (Abs) 0.0 0.0 - 0.1 x10E3/uL    Comment: **Effective December 19, 2022, profile [id number] CBC/Differential**   (No Platelet) will be made non-orderable. [facility] Offers:   [id number] CBC With Differential/Platelet   CMP14+EGFR     Status: None   Collection Time: 12/06/22 11:26 AM  Result Value Ref Range   Glucose 84 70 - 99 mg/dL   BUN 15 8 - 27 mg/dL   Creatinine, Ser 6.27 0.76 - 1.27 mg/dL   eGFR 93 >03 JK/KXF/8.18   BUN/Creatinine Ratio 18 10 - 24   Sodium 137 134 - 144 mmol/L   Potassium 4.8 3.5 - 5.2 mmol/L   Chloride 100 96 - 106 mmol/L   CO2 25 20 - 29 mmol/L   Calcium 9.5 8.6 - 10.2 mg/dL   Total Protein 6.8 6.0 - 8.5 g/dL   Albumin 4.3 3.8 - 4.8 g/dL   Globulin, Total 2.5 1.5 - 4.5 g/dL   Bilirubin Total 0.5 0.0 - 1.2 mg/dL   Alkaline Phosphatase 98 44 - 121 IU/L   AST 19 0 - 40 IU/L  ALT 17 0 - 44 IU/L  TSH     Status: None   Collection Time: 12/06/22 11:26 AM  Result Value Ref Range   TSH 1.140 0.450 - 4.500 uIU/mL  Hemoglobin A1c     Status: None   Collection Time: 12/06/22 11:26 AM  Result Value Ref Range   Hgb A1c MFr Bld 5.1 4.8 - 5.6 %    Comment:          Prediabetes: 5.7 - 6.4          Diabetes: >6.4          Glycemic control for adults with diabetes: <7.0    Est. average glucose Bld gHb Est-mCnc 100 mg/dL  Vitamin Z61     Status: None   Collection Time: 12/06/22 11:26 AM  Result Value Ref Range   Vitamin B-12 1,057 232 - 1,245 pg/mL       Assessment & Plan:   Problem List Items Addressed This Visit   None Visit Diagnoses     Bladder pain    -  Primary   UA in office today WNL.  Patient notified of results.   Relevant Orders   POCT Urinalysis Dipstick (81002) (Completed)        Return as scheduled.  Total time spent: 10 minutes  Miki Kins, FNP  11/30/2022   This document may have been prepared by [facility] Voice Recognition software and as such may include unintentional dictation errors.

## 2022-12-06 ENCOUNTER — Ambulatory Visit (INDEPENDENT_AMBULATORY_CARE_PROVIDER_SITE_OTHER): Payer: Medicare Other | Admitting: Family

## 2022-12-06 VITALS — BP 130/89 | HR 72 | Ht 65.0 in | Wt 213.0 lb

## 2022-12-06 DIAGNOSIS — E559 Vitamin D deficiency, unspecified: Secondary | ICD-10-CM

## 2022-12-06 DIAGNOSIS — E782 Mixed hyperlipidemia: Secondary | ICD-10-CM

## 2022-12-06 DIAGNOSIS — E039 Hypothyroidism, unspecified: Secondary | ICD-10-CM

## 2022-12-06 DIAGNOSIS — R5383 Other fatigue: Secondary | ICD-10-CM

## 2022-12-06 DIAGNOSIS — R7303 Prediabetes: Secondary | ICD-10-CM | POA: Diagnosis not present

## 2022-12-06 DIAGNOSIS — E538 Deficiency of other specified B group vitamins: Secondary | ICD-10-CM

## 2022-12-06 DIAGNOSIS — I10 Essential (primary) hypertension: Secondary | ICD-10-CM

## 2022-12-06 MED ORDER — VILAZODONE HCL 10 MG PO TABS: 5.0000 mg | ORAL_TABLET | Freq: Every day | ORAL | 0 refills | Status: DC

## 2022-12-06 MED ORDER — LORAZEPAM 0.5 MG PO TABS: ORAL_TABLET | ORAL | 1 refills | Status: DC

## 2022-12-09 ENCOUNTER — Other Ambulatory Visit: Payer: Self-pay | Admitting: Internal Medicine

## 2022-12-09 LAB — HEMOGLOBIN A1C
Est. average glucose Bld gHb Est-mCnc: 100 mg/dL
Hgb A1c MFr Bld: 5.1 % (ref 4.8–5.6)

## 2022-12-09 LAB — CBC WITH DIFFERENTIAL
Basophils Absolute: 0 10*3/uL (ref 0.0–0.2)
Basos: 1 %
EOS (ABSOLUTE): 0.1 10*3/uL (ref 0.0–0.4)
Eos: 2 %
Hematocrit: 42.4 % (ref 37.5–51.0)
Hemoglobin: 14 g/dL (ref 13.0–17.7)
Immature Grans (Abs): 0 10*3/uL (ref 0.0–0.1)
Immature Granulocytes: 0 %
Lymphocytes Absolute: 1.2 10*3/uL (ref 0.7–3.1)
Lymphs: 20 %
MCH: 30.9 pg (ref 26.6–33.0)
MCHC: 33 g/dL (ref 31.5–35.7)
MCV: 94 fL (ref 79–97)
Monocytes Absolute: 0.6 10*3/uL (ref 0.1–0.9)
Monocytes: 10 %
Neutrophils Absolute: 4.1 10*3/uL (ref 1.4–7.0)
Neutrophils: 67 %
RBC: 4.53 x10E6/uL (ref 4.14–5.80)
RDW: 13.1 % (ref 11.6–15.4)
WBC: 6 10*3/uL (ref 3.4–10.8)

## 2022-12-09 LAB — LIPID PANEL
Chol/HDL Ratio: 2.4 ratio (ref 0.0–5.0)
Cholesterol, Total: 211 mg/dL — ABNORMAL HIGH (ref 100–199)
HDL: 87 mg/dL (ref >39)
LDL Chol Calc (NIH): 108 mg/dL — ABNORMAL HIGH (ref 0–99)
Triglycerides: 91 mg/dL (ref 0–149)
VLDL Cholesterol Cal: 16 mg/dL (ref 5–40)

## 2022-12-09 LAB — CMP14+EGFR
ALT: 17 IU/L (ref 0–44)
AST: 19 IU/L (ref 0–40)
Albumin: 4.3 g/dL (ref 3.8–4.8)
Alkaline Phosphatase: 98 IU/L (ref 44–121)
BUN/Creatinine Ratio: 18 (ref 10–24)
BUN: 15 mg/dL (ref 8–27)
Bilirubin Total: 0.5 mg/dL (ref 0.0–1.2)
CO2: 25 mmol/L (ref 20–29)
Calcium: 9.5 mg/dL (ref 8.6–10.2)
Chloride: 100 mmol/L (ref 96–106)
Creatinine, Ser: 0.82 mg/dL (ref 0.76–1.27)
Globulin, Total: 2.5 g/dL (ref 1.5–4.5)
Glucose: 84 mg/dL (ref 70–99)
Potassium: 4.8 mmol/L (ref 3.5–5.2)
Sodium: 137 mmol/L (ref 134–144)
Total Protein: 6.8 g/dL (ref 6.0–8.5)
eGFR: 93 mL/min/{1.73_m2} (ref >59)

## 2022-12-09 LAB — VITAMIN D 25 HYDROXY (VIT D DEFICIENCY, FRACTURES): Vit D, 25-Hydroxy: 61 ng/mL (ref 30.0–100.0)

## 2022-12-09 LAB — VITAMIN B12: Vitamin B-12: 1057 pg/mL (ref 232–1245)

## 2022-12-09 LAB — TSH: TSH: 1.14 u[IU]/mL (ref 0.450–4.500)

## 2022-12-10 ENCOUNTER — Encounter: Payer: Self-pay | Admitting: Family

## 2022-12-11 ENCOUNTER — Encounter: Payer: Self-pay | Admitting: Family

## 2022-12-11 NOTE — Progress Notes (Signed)
Established Patient Office Visit  Subjective:  Patient ID: Vincent Cole, male    DOB: [birth year]  Age: 72 y.o. MRN: [id number]  Chief Complaint  Patient presents with   Acute Visit    Sweating A lot & Discuss medications    Patient is here with a few concerns today His biggest is that he has been having additional difficulty sleeping, says that he needs a refill on his lorazepam to help with this  He has stopped taking the lunesta, as well as cutting back on his viibryd and other meds.   No other concerns at this time.   Past Medical History:  Diagnosis Date   Actinic keratosis    Arthritis    Benign bladder mass    Complication of anesthesia    PONV x 1 in 2010   Depression    Frequency of urination    GERD (gastroesophageal reflux disease) no meds   Heart murmur    was told many years ago   History of recurrent UTIs    Hypertension    IC (interstitial cystitis)    Nocturia    OSA on CPAP nightly   Pelvic pain in male    PONV (postoperative nausea and vomiting)    Simple renal cyst    Urgency of urination    Urinary hesitancy     Past Surgical History:  Procedure Laterality Date   BUNIONECTOMY Right 01/15/2021   Procedure: Flavia Shipper;  Surgeon: Gwyneth Revels, DPM;  Location: [facility];  Service: Podiatry;  Laterality: Right;   COLONOSCOPY     COLONOSCOPY WITH PROPOFOL N/A 06/25/2021   Procedure: COLONOSCOPY WITH PROPOFOL;  Surgeon: Regis Bill, MD;  Location: [facility];  Service: Endoscopy;  Laterality: N/A;   CORRECTION OVERLAPPING TOES Right 01/15/2021   Procedure: CORRECTION OVERLAPPING TOES;  Surgeon: Gwyneth Revels, DPM;  Location: [facility];  Service: Podiatry;  Laterality: Right;   CYSTO WITH HYDRODISTENSION  06/02/2011   Procedure: CYSTOSCOPY/HYDRODISTENSION;  Surgeon: Martina Sinner, MD;  Location: [facility];  Service: Urology;  Laterality: N/A;  catheter placement   HAMMER TOE SURGERY Right 01/15/2021    Procedure: HAMMER TOE CORRECTION;  Surgeon: Gwyneth Revels, DPM;  Location: [facility];  Service: Podiatry;  Laterality: Right;   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   JOINT REPLACEMENT     shoulder, left knee   KNEE ARTHROSCOPY     TRANSURETHRAL RESECTION OF PROSTATE  X3   LAST ONE MAY 2012   WEIL OSTEOTOMY Right 01/15/2021   Procedure: WEIL;  Surgeon: Gwyneth Revels, DPM;  Location: [facility];  Service: Podiatry;  Laterality: Right;    Social History   Socioeconomic History   Marital status: Married    Spouse name: Not on file   Number of children: 1   Years of education: Not on file   Highest education level: Not on file  Occupational History   Not on file  Tobacco Use   Smoking status: Never   Smokeless tobacco: Never  Vaping Use   Vaping status: Never Used  Substance and Sexual Activity   Alcohol use: Yes    Comment: occ   Drug use: No   Sexual activity: Not on file  Other Topics Concern   Not on file  Social History Narrative   Not on file   Social Determinants of Health   Financial Resource Strain: Not on file  Food Insecurity: Not on file  Transportation Needs: Not on file  Physical  Activity: Not on file  Stress: Not on file  Social Connections: Not on file  Intimate Partner Violence: Not on file    Family History  Problem Relation Age of Onset   Heart disease Mother     No Known Allergies  Review of Systems  Psychiatric/Behavioral:  The patient has insomnia.   All other systems reviewed and are negative.      Objective:   BP 130/89   Pulse 72   Ht 5\' 5"  (1.651 m)   Wt 213 lb (96.6 kg)   SpO2 99%   BMI 35.45 kg/m   Vitals:   12/06/22 1103  BP: 130/89  Pulse: 72  Height: 5\' 5"  (1.651 m)  Weight: 213 lb (96.6 kg)  SpO2: 99%  BMI (Calculated): 35.44    Physical Exam Vitals and nursing note reviewed.  Constitutional:      Appearance: Normal appearance. He is normal weight.  Eyes:     Extraocular Movements: Extraocular  movements intact.     Conjunctiva/sclera: Conjunctivae normal.     Pupils: Pupils are equal, round, and reactive to light.  Cardiovascular:     Rate and Rhythm: Normal rate and regular rhythm.     Pulses: Normal pulses.     Heart sounds: Normal heart sounds.  Pulmonary:     Effort: Pulmonary effort is normal.     Breath sounds: Normal breath sounds.  Neurological:     General: No focal deficit present.     Mental Status: He is alert and oriented to person, place, and time. Mental status is at baseline.  Psychiatric:        Mood and Affect: Mood normal.        Behavior: Behavior normal.        Thought Content: Thought content normal.        Judgment: Judgment normal.      Results for orders placed or performed in visit on 12/06/22  Lipid panel  Result Value Ref Range   Cholesterol, Total 211 (H) 100 - 199 mg/dL   Triglycerides 91 0 - 149 mg/dL   HDL 87 >32 mg/dL   VLDL Cholesterol Cal 16 5 - 40 mg/dL   LDL Chol Calc (NIH) 440 (H) 0 - 99 mg/dL   Chol/HDL Ratio 2.4 0.0 - 5.0 ratio  VITAMIN D 25 Hydroxy (Vit-D Deficiency, Fractures)  Result Value Ref Range   Vit D, 25-Hydroxy 61.0 30.0 - 100.0 ng/mL  CBC With Differential  Result Value Ref Range   WBC 6.0 3.4 - 10.8 x10E3/uL   RBC 4.53 4.14 - 5.80 x10E6/uL   Hemoglobin 14.0 13.0 - 17.7 g/dL   Hematocrit 10.2 72.5 - 51.0 %   MCV 94 79 - 97 fL   MCH 30.9 26.6 - 33.0 pg   MCHC 33.0 31.5 - 35.7 g/dL   RDW 36.6 44.0 - 34.7 %   Neutrophils 67 Not Estab. %   Lymphs 20 Not Estab. %   Monocytes 10 Not Estab. %   Eos 2 Not Estab. %   Basos 1 Not Estab. %   Neutrophils Absolute 4.1 1.4 - 7.0 x10E3/uL   Lymphocytes Absolute 1.2 0.7 - 3.1 x10E3/uL   Monocytes Absolute 0.6 0.1 - 0.9 x10E3/uL   EOS (ABSOLUTE) 0.1 0.0 - 0.4 x10E3/uL   Basophils Absolute 0.0 0.0 - 0.2 x10E3/uL   Immature Granulocytes 0 Not Estab. %   Immature Grans (Abs) 0.0 0.0 - 0.1 x10E3/uL  CMP14+EGFR  Result Value Ref Range   Glucose  84 70 - 99 mg/dL   BUN  15 8 - 27 mg/dL   Creatinine, Ser 1.91 0.76 - 1.27 mg/dL   eGFR 93 >47 WG/NFA/2.13   BUN/Creatinine Ratio 18 10 - 24   Sodium 137 134 - 144 mmol/L   Potassium 4.8 3.5 - 5.2 mmol/L   Chloride 100 96 - 106 mmol/L   CO2 25 20 - 29 mmol/L   Calcium 9.5 8.6 - 10.2 mg/dL   Total Protein 6.8 6.0 - 8.5 g/dL   Albumin 4.3 3.8 - 4.8 g/dL   Globulin, Total 2.5 1.5 - 4.5 g/dL   Bilirubin Total 0.5 0.0 - 1.2 mg/dL   Alkaline Phosphatase 98 44 - 121 IU/L   AST 19 0 - 40 IU/L   ALT 17 0 - 44 IU/L  TSH  Result Value Ref Range   TSH 1.140 0.450 - 4.500 uIU/mL  Hemoglobin A1c  Result Value Ref Range   Hgb A1c MFr Bld 5.1 4.8 - 5.6 %   Est. average glucose Bld gHb Est-mCnc 100 mg/dL  Vitamin Y86  Result Value Ref Range   Vitamin B-12 1,057 232 - 1,245 pg/mL    Recent Results (from the past 2160 hour(s))  POCT Urinalysis Dipstick (57846)     Status: None   Collection Time: 11/30/22 10:57 AM  Result Value Ref Range   Color, UA     Clarity, UA     Glucose, UA Negative Negative   Bilirubin, UA -    Ketones, UA -    Spec Grav, UA 1.015 1.010 - 1.025   Blood, UA -    pH, UA 6.0 5.0 - 8.0   Protein, UA Negative Negative   Urobilinogen, UA 0.2 0.2 or 1.0 E.U./dL   Nitrite, UA negative    Leukocytes, UA Negative Negative   Appearance     Odor    Lipid panel     Status: Abnormal   Collection Time: 12/06/22 11:26 AM  Result Value Ref Range   Cholesterol, Total 211 (H) 100 - 199 mg/dL   Triglycerides 91 0 - 149 mg/dL   HDL 87 >96 mg/dL   VLDL Cholesterol Cal 16 5 - 40 mg/dL   LDL Chol Calc (NIH) 295 (H) 0 - 99 mg/dL   Chol/HDL Ratio 2.4 0.0 - 5.0 ratio    Comment:                                   T. Chol/HDL Ratio                                             Men  Women                               1/2 Avg.Risk  3.4    3.3                                   Avg.Risk  5.0    4.4                                2X Avg.Risk  9.6  7.1                                3X Avg.Risk 23.4   11.0    VITAMIN D 25 Hydroxy (Vit-D Deficiency, Fractures)     Status: None   Collection Time: 12/06/22 11:26 AM  Result Value Ref Range   Vit D, 25-Hydroxy 61.0 30.0 - 100.0 ng/mL    Comment: Vitamin D deficiency has been defined by the Institute of Medicine and an Endocrine Society practice guideline as a level of serum 25-OH vitamin D less than 20 ng/mL (1,2). The Endocrine Society went on to further define vitamin D insufficiency as a level between 21 and 29 ng/mL (2). 1. IOM (Institute of Medicine). 2010. Dietary reference    intakes for calcium and D. [place]: The    [employer]. 2. Holick MF, Binkley [place], Bischoff-Ferrari HA, et al.    Evaluation, treatment, and prevention of vitamin D    deficiency: an Endocrine Society clinical practice    guideline. JCEM. 2011 Jul; 96(7):1911-30.   CBC With Differential     Status: None   Collection Time: 12/06/22 11:26 AM  Result Value Ref Range   WBC 6.0 3.4 - 10.8 x10E3/uL   RBC 4.53 4.14 - 5.80 x10E6/uL   Hemoglobin 14.0 13.0 - 17.7 g/dL   Hematocrit 53.6 64.4 - 51.0 %   MCV 94 79 - 97 fL   MCH 30.9 26.6 - 33.0 pg   MCHC 33.0 31.5 - 35.7 g/dL   RDW 03.4 74.2 - 59.5 %   Neutrophils 67 Not Estab. %   Lymphs 20 Not Estab. %   Monocytes 10 Not Estab. %   Eos 2 Not Estab. %   Basos 1 Not Estab. %   Neutrophils Absolute 4.1 1.4 - 7.0 x10E3/uL   Lymphocytes Absolute 1.2 0.7 - 3.1 x10E3/uL   Monocytes Absolute 0.6 0.1 - 0.9 x10E3/uL   EOS (ABSOLUTE) 0.1 0.0 - 0.4 x10E3/uL   Basophils Absolute 0.0 0.0 - 0.2 x10E3/uL   Immature Granulocytes 0 Not Estab. %   Immature Grans (Abs) 0.0 0.0 - 0.1 x10E3/uL    Comment: **Effective December 19, 2022, profile [id number] CBC/Differential**   (No Platelet) will be made non-orderable. [facility] Offers:   [id number] CBC With Differential/Platelet   CMP14+EGFR     Status: None   Collection Time: 12/06/22 11:26 AM  Result Value Ref Range   Glucose 84 70 - 99 mg/dL   BUN 15 8 - 27 mg/dL    Creatinine, Ser 4.33 0.76 - 1.27 mg/dL   eGFR 93 >29 JJ/OAC/1.66   BUN/Creatinine Ratio 18 10 - 24   Sodium 137 134 - 144 mmol/L   Potassium 4.8 3.5 - 5.2 mmol/L   Chloride 100 96 - 106 mmol/L   CO2 25 20 - 29 mmol/L   Calcium 9.5 8.6 - 10.2 mg/dL   Total Protein 6.8 6.0 - 8.5 g/dL   Albumin 4.3 3.8 - 4.8 g/dL   Globulin, Total 2.5 1.5 - 4.5 g/dL   Bilirubin Total 0.5 0.0 - 1.2 mg/dL   Alkaline Phosphatase 98 44 - 121 IU/L   AST 19 0 - 40 IU/L   ALT 17 0 - 44 IU/L  TSH     Status: None   Collection Time: 12/06/22 11:26 AM  Result Value Ref Range   TSH 1.140 0.450 - 4.500 uIU/mL  Hemoglobin A1c     Status:  None   Collection Time: 12/06/22 11:26 AM  Result Value Ref Range   Hgb A1c MFr Bld 5.1 4.8 - 5.6 %    Comment:          Prediabetes: 5.7 - 6.4          Diabetes: >6.4          Glycemic control for adults with diabetes: <7.0    Est. average glucose Bld gHb Est-mCnc 100 mg/dL  Vitamin Z61     Status: None   Collection Time: 12/06/22 11:26 AM  Result Value Ref Range   Vitamin B-12 1,057 232 - 1,245 pg/mL       Assessment & Plan:   Problem List Items Addressed This Visit       Active Problems   Essential hypertension, benign   Relevant Orders   CBC With Differential (Completed)   CMP14+EGFR (Completed)   Hyperlipidemia - Primary   Relevant Orders   Lipid panel (Completed)   CBC With Differential (Completed)   CMP14+EGFR (Completed)   Other Visit Diagnoses     Prediabetes       Relevant Orders   CBC With Differential (Completed)   CMP14+EGFR (Completed)   Hemoglobin A1c (Completed)   Vitamin D deficiency, unspecified       Relevant Orders   VITAMIN D 25 Hydroxy (Vit-D Deficiency, Fractures) (Completed)   CBC With Differential (Completed)   CMP14+EGFR (Completed)   Other fatigue       Relevant Orders   CBC With Differential (Completed)   CMP14+EGFR (Completed)   TSH (Completed)   Hypothyroidism (acquired)       Relevant Orders   CBC With Differential  (Completed)   CMP14+EGFR (Completed)   B12 deficiency due to diet       Relevant Orders   CBC With Differential (Completed)   CMP14+EGFR (Completed)   Vitamin B12 (Completed)      Due for labs, so will check these today.  Will call him with results.   Will send refills for his lorazepam.  Cutting back on the viibryd dose.    Return in about 1 week (around 12/13/2022) for F/U.   Total time spent: 20 minutes  Miki Kins, FNP  12/06/2022   This document may have been prepared by [facility] Voice Recognition software and as such may include unintentional dictation errors.

## 2022-12-14 ENCOUNTER — Ambulatory Visit (INDEPENDENT_AMBULATORY_CARE_PROVIDER_SITE_OTHER): Payer: Medicare Other | Admitting: Family

## 2022-12-14 VITALS — BP 110/80 | HR 73 | Ht 64.5 in | Wt 211.4 lb

## 2022-12-14 DIAGNOSIS — R61 Generalized hyperhidrosis: Secondary | ICD-10-CM

## 2022-12-14 MED ORDER — ONDANSETRON 4 MG PO TBDP: 4.0000 mg | ORAL_TABLET | Freq: Three times a day (TID) | ORAL | 0 refills | Status: DC | PRN

## 2022-12-14 NOTE — Progress Notes (Signed)
Established Patient Office Visit  Subjective:  Patient ID: Vincent Cole, male    DOB: [birth year]  Age: 72 y.o. MRN: [id number]  Chief Complaint  Patient presents with   Follow-up    Patient is here today for his 2 weeks follow up.  He has been feeling well since last appointment.   He does have additional concerns to discuss today.  He is still having issues with his sweating, asks if we can check anything else.   Would like to have an ultrasound of his liver to make sure it is not the cause of the issue.   Labs are not due today. He needs refills.   I have reviewed his active problem list, medication list, allergies, notes from last encounter, lab results for his appointment today.    No other concerns at this time.   Past Medical History:  Diagnosis Date   Abdominal pain 01/17/2011   Formatting of this note might be different from the original.  Note: Unchanged - chronic  suprapubic/lower abdominal area since TURP in 2005; urology has now diagnosed, after extensive evaluation, interstitial cystitis; has also had repeat TURP in June 2010, and a surgery on the neck of the bladder in July of 2011     Actinic keratosis    Arthritis    Benign bladder mass    Complete tear of left rotator cuff 04/19/2017   Complication of anesthesia    PONV x 1 in 2010   Depression    Frequency of urination    GERD (gastroesophageal reflux disease) no meds   Heart murmur    was told many years ago   History of DVT of lower extremity 02/03/2020   History of recurrent UTIs    Hypertension    IC (interstitial cystitis)    Nocturia    OSA on CPAP nightly   Pelvic pain in male    PONV (postoperative nausea and vomiting)    Simple renal cyst    Urgency of urination    Urinary hesitancy     Past Surgical History:  Procedure Laterality Date   BUNIONECTOMY Right 01/15/2021   Procedure: Flavia Shipper;  Surgeon: Gwyneth Revels, DPM;  Location: [facility];  Service: Podiatry;   Laterality: Right;   COLONOSCOPY     COLONOSCOPY WITH PROPOFOL N/A 06/25/2021   Procedure: COLONOSCOPY WITH PROPOFOL;  Surgeon: Regis Bill, MD;  Location: [facility];  Service: Endoscopy;  Laterality: N/A;   CORRECTION OVERLAPPING TOES Right 01/15/2021   Procedure: CORRECTION OVERLAPPING TOES;  Surgeon: Gwyneth Revels, DPM;  Location: [facility];  Service: Podiatry;  Laterality: Right;   CYSTO WITH HYDRODISTENSION  06/02/2011   Procedure: CYSTOSCOPY/HYDRODISTENSION;  Surgeon: Martina Sinner, MD;  Location: [facility];  Service: Urology;  Laterality: N/A;  catheter placement   HAMMER TOE SURGERY Right 01/15/2021   Procedure: HAMMER TOE CORRECTION;  Surgeon: Gwyneth Revels, DPM;  Location: [facility];  Service: Podiatry;  Laterality: Right;   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   JOINT REPLACEMENT     shoulder, left knee   KNEE ARTHROSCOPY     TRANSURETHRAL RESECTION OF PROSTATE  X3   LAST ONE MAY 2012   WEIL OSTEOTOMY Right 01/15/2021   Procedure: WEIL;  Surgeon: Gwyneth Revels, DPM;  Location: [facility];  Service: Podiatry;  Laterality: Right;    Social History   Socioeconomic History   Marital status: Married    Spouse name: Not on file   Number of children:  1   Years of education: Not on file   Highest education level: Not on file  Occupational History   Not on file  Tobacco Use   Smoking status: Never   Smokeless tobacco: Never  Vaping Use   Vaping status: Never Used  Substance and Sexual Activity   Alcohol use: Yes    Comment: occ   Drug use: No   Sexual activity: Not on file  Other Topics Concern   Not on file  Social History Narrative   Not on file   Social Determinants of Health   Financial Resource Strain: Not on file  Food Insecurity: Not on file  Transportation Needs: Not on file  Physical Activity: Not on file  Stress: Not on file  Social Connections: Not on file  Intimate Partner Violence: Not on file    Family  History  Problem Relation Age of Onset   Heart disease Mother     No Known Allergies  Review of Systems  All other systems reviewed and are negative.      Objective:   BP 110/80   Pulse 73   Ht 5' 4.5" (1.638 m)   Wt 211 lb 6.4 oz (95.9 kg)   SpO2 98%   BMI 35.73 kg/m   Vitals:   12/14/22 1016  BP: 110/80  Pulse: 73  Height: 5' 4.5" (1.638 m)  Weight: 211 lb 6.4 oz (95.9 kg)  SpO2: 98%  BMI (Calculated): 35.74    Physical Exam Vitals and nursing note reviewed.  Constitutional:      Appearance: Normal appearance. He is normal weight.  Eyes:     Pupils: Pupils are equal, round, and reactive to light.  Cardiovascular:     Rate and Rhythm: Normal rate and regular rhythm.     Pulses: Normal pulses.     Heart sounds: Normal heart sounds.  Pulmonary:     Effort: Pulmonary effort is normal.     Breath sounds: Normal breath sounds.  Neurological:     Mental Status: He is alert.  Psychiatric:        Mood and Affect: Mood normal.        Behavior: Behavior normal.      Results for orders placed or performed in visit on 12/14/22  Testosterone, Total  Result Value Ref Range   Testosterone 711 264 - 916 ng/dL  Prolactin  Result Value Ref Range   Prolactin 14.3 3.6 - 25.2 ng/mL  Uric acid  Result Value Ref Range   Uric Acid 6.1 3.8 - 8.4 mg/dL    Recent Results (from the past 2160 hour(s))  POCT Urinalysis Dipstick (84696)     Status: None   Collection Time: 11/30/22 10:57 AM  Result Value Ref Range   Color, UA     Clarity, UA     Glucose, UA Negative Negative   Bilirubin, UA -    Ketones, UA -    Spec Grav, UA 1.015 1.010 - 1.025   Blood, UA -    pH, UA 6.0 5.0 - 8.0   Protein, UA Negative Negative   Urobilinogen, UA 0.2 0.2 or 1.0 E.U./dL   Nitrite, UA negative    Leukocytes, UA Negative Negative   Appearance     Odor    Lipid panel     Status: Abnormal   Collection Time: 12/06/22 11:26 AM  Result Value Ref Range   Cholesterol, Total 211 (H)  100 - 199 mg/dL   Triglycerides 91 0 - 149  mg/dL   HDL 87 >95 mg/dL   VLDL Cholesterol Cal 16 5 - 40 mg/dL   LDL Chol Calc (NIH) 621 (H) 0 - 99 mg/dL   Chol/HDL Ratio 2.4 0.0 - 5.0 ratio    Comment:                                   T. Chol/HDL Ratio                                             Men  Women                               1/2 Avg.Risk  3.4    3.3                                   Avg.Risk  5.0    4.4                                2X Avg.Risk  9.6    7.1                                3X Avg.Risk 23.4   11.0   VITAMIN D 25 Hydroxy (Vit-D Deficiency, Fractures)     Status: None   Collection Time: 12/06/22 11:26 AM  Result Value Ref Range   Vit D, 25-Hydroxy 61.0 30.0 - 100.0 ng/mL    Comment: Vitamin D deficiency has been defined by the Institute of Medicine and an Endocrine Society practice guideline as a level of serum 25-OH vitamin D less than 20 ng/mL (1,2). The Endocrine Society went on to further define vitamin D insufficiency as a level between 21 and 29 ng/mL (2). 1. IOM (Institute of Medicine). 2010. Dietary reference    intakes for calcium and D. [place]: The    [employer]. 2. Holick MF, Binkley [place], Bischoff-Ferrari HA, et al.    Evaluation, treatment, and prevention of vitamin D    deficiency: an Endocrine Society clinical practice    guideline. JCEM. 2011 Jul; 96(7):1911-30.   CBC With Differential     Status: None   Collection Time: 12/06/22 11:26 AM  Result Value Ref Range   WBC 6.0 3.4 - 10.8 x10E3/uL   RBC 4.53 4.14 - 5.80 x10E6/uL   Hemoglobin 14.0 13.0 - 17.7 g/dL   Hematocrit 30.8 65.7 - 51.0 %   MCV 94 79 - 97 fL   MCH 30.9 26.6 - 33.0 pg   MCHC 33.0 31.5 - 35.7 g/dL   RDW 84.6 96.2 - 95.2 %   Neutrophils 67 Not Estab. %   Lymphs 20 Not Estab. %   Monocytes 10 Not Estab. %   Eos 2 Not Estab. %   Basos 1 Not Estab. %   Neutrophils Absolute 4.1 1.4 - 7.0 x10E3/uL   Lymphocytes Absolute 1.2 0.7 - 3.1 x10E3/uL   Monocytes  Absolute 0.6 0.1 - 0.9 x10E3/uL   EOS (ABSOLUTE) 0.1 0.0 - 0.4 x10E3/uL   Basophils Absolute 0.0 0.0 - 0.2  x10E3/uL   Immature Granulocytes 0 Not Estab. %   Immature Grans (Abs) 0.0 0.0 - 0.1 x10E3/uL    Comment: **Effective December 19, 2022, profile [id number] CBC/Differential**   (No Platelet) will be made non-orderable. [facility] Offers:   [id number] CBC With Differential/Platelet   CMP14+EGFR     Status: None   Collection Time: 12/06/22 11:26 AM  Result Value Ref Range   Glucose 84 70 - 99 mg/dL   BUN 15 8 - 27 mg/dL   Creatinine, Ser 9.14 0.76 - 1.27 mg/dL   eGFR 93 >78 GN/FAO/1.30   BUN/Creatinine Ratio 18 10 - 24   Sodium 137 134 - 144 mmol/L   Potassium 4.8 3.5 - 5.2 mmol/L   Chloride 100 96 - 106 mmol/L   CO2 25 20 - 29 mmol/L   Calcium 9.5 8.6 - 10.2 mg/dL   Total Protein 6.8 6.0 - 8.5 g/dL   Albumin 4.3 3.8 - 4.8 g/dL   Globulin, Total 2.5 1.5 - 4.5 g/dL   Bilirubin Total 0.5 0.0 - 1.2 mg/dL   Alkaline Phosphatase 98 44 - 121 IU/L   AST 19 0 - 40 IU/L   ALT 17 0 - 44 IU/L  TSH     Status: None   Collection Time: 12/06/22 11:26 AM  Result Value Ref Range   TSH 1.140 0.450 - 4.500 uIU/mL  Hemoglobin A1c     Status: None   Collection Time: 12/06/22 11:26 AM  Result Value Ref Range   Hgb A1c MFr Bld 5.1 4.8 - 5.6 %    Comment:          Prediabetes: 5.7 - 6.4          Diabetes: >6.4          Glycemic control for adults with diabetes: <7.0    Est. average glucose Bld gHb Est-mCnc 100 mg/dL  Vitamin Q65     Status: None   Collection Time: 12/06/22 11:26 AM  Result Value Ref Range   Vitamin B-12 1,057 232 - 1,245 pg/mL  Testosterone, Total     Status: None   Collection Time: 12/14/22 10:44 AM  Result Value Ref Range   Testosterone 711 264 - 916 ng/dL    Comment: Adult male reference interval is based on a population of healthy nonobese males (BMI <30) between 22 and 17 years old. [place]ison, et.al. JCEM [phone]. PMID: [id number].   Prolactin     Status: None    Collection Time: 12/14/22 10:44 AM  Result Value Ref Range   Prolactin 14.3 3.6 - 25.2 ng/mL  Uric acid     Status: None   Collection Time: 12/14/22 10:44 AM  Result Value Ref Range   Uric Acid 6.1 3.8 - 8.4 mg/dL    Comment:            Therapeutic target for gout patients: <6.0       Assessment & Plan:   Problem List Items Addressed This Visit   None Visit Diagnoses     Hyperhidrosis    -  Primary   Getting hormone levels and Uric acid today Reiteratedthis is unlikely to be r/t liver if enzymes are normal. May consider if all labs remain normal.   Relevant Orders   Testosterone, Total (Completed)   Prolactin (Completed)   Uric acid (Completed)       Return in about 1 month (around 01/14/2023).   Total time spent: 20 minutes  Miki Kins, FNP  12/14/2022   This  document may have been prepared by [employer] Voice Recognition software and as such may include unintentional dictation errors.

## 2022-12-15 LAB — URIC ACID: Uric Acid: 6.1 mg/dL (ref 3.8–8.4)

## 2022-12-15 LAB — TESTOSTERONE: Testosterone: 711 ng/dL (ref 264–916)

## 2022-12-15 LAB — PROLACTIN: Prolactin: 14.3 ng/mL (ref 3.6–25.2)

## 2022-12-25 ENCOUNTER — Encounter: Payer: Self-pay | Admitting: Family

## 2023-02-08 ENCOUNTER — Other Ambulatory Visit: Payer: Self-pay | Admitting: Family

## 2023-04-13 ENCOUNTER — Ambulatory Visit (INDEPENDENT_AMBULATORY_CARE_PROVIDER_SITE_OTHER): Payer: Medicare Other | Admitting: Dermatology

## 2023-04-13 ENCOUNTER — Encounter: Payer: Self-pay | Admitting: Dermatology

## 2023-04-13 DIAGNOSIS — L853 Xerosis cutis: Secondary | ICD-10-CM | POA: Diagnosis not present

## 2023-04-13 DIAGNOSIS — D492 Neoplasm of unspecified behavior of bone, soft tissue, and skin: Secondary | ICD-10-CM | POA: Diagnosis not present

## 2023-04-13 DIAGNOSIS — L720 Epidermal cyst: Secondary | ICD-10-CM | POA: Diagnosis not present

## 2023-04-13 DIAGNOSIS — L820 Inflamed seborrheic keratosis: Secondary | ICD-10-CM

## 2023-04-13 MED ORDER — DOXYCYCLINE MONOHYDRATE 100 MG PO CAPS: 100.0000 mg | ORAL_CAPSULE | Freq: Two times a day (BID) | ORAL | 0 refills | Status: AC

## 2023-04-13 NOTE — Patient Instructions (Addendum)
Pre-Operative Instructions  You are scheduled for a surgical procedure at [facility]. We recommend you read the following instructions. If you have any questions or concerns, please call the office at [phone].  Shower and wash the entire body with soap and water the day of your surgery paying special attention to cleansing at and around the planned surgery site.  Avoid aspirin or aspirin containing products at least fourteen (14) days prior to your surgical procedure and for at least one week (7 Days) after your surgical procedure. If you take aspirin on a regular basis for heart disease or history of stroke or for any other reason, we may recommend you continue taking aspirin but please notify us if you take this on a regular basis. Aspirin can cause more bleeding to occur during surgery as well as prolonged bleeding and bruising after surgery.   Avoid other nonsteroidal pain medications at least one week prior to surgery and at least one week prior to your surgery. These include medications such as Ibuprofen (Motrin, Advil and Nuprin), Naprosyn, Voltaren, Relafen, etc. If medications are used for therapeutic reasons, please inform us as they can cause increased bleeding or prolonged bleeding during and bruising after surgical procedures.   Please advise [place] if you are taking any "blood thinner" medications such as Coumadin or Dipyridamole or Plavix or similar medications. These cause increased bleeding and prolonged bleeding during procedures and bruising after surgical procedures. We may have to consider discontinuing these medications briefly prior to and shortly after your surgery if safe to do so.   Please inform us of all medications you are currently taking. All medications that are taken regularly should be taken the day of surgery as you always do. Nevertheless, we need to be informed of what medications you are taking prior to surgery to know whether they will affect the  procedure or cause any complications.   Please inform us of any medication allergies. Also inform us of whether you have allergies to Latex or rubber products or whether you have had any adverse reaction to Lidocaine or Epinephrine.  Please inform us of any prosthetic or artificial body parts such as artificial heart valve, joint replacements, etc., or similar condition that might require preoperative antibiotics.   We recommend avoidance of alcohol at least two weeks prior to surgery and continued avoidance for at least two weeks after surgery.   We recommend discontinuation of tobacco smoking at least two weeks prior to surgery and continued abstinence for at least two weeks after surgery.  Do not plan strenuous exercise, strenuous work or strenuous lifting for approximately four weeks after your surgery.   We request if you are unable to make your scheduled surgical appointment, please call us at least a week in advance or as soon as you are aware of a problem so that we can cancel or reschedule the appointment.   You MAY TAKE TYLENOL (acetaminophen) for pain as it is not a blood thinner.   PLEASE PLAN TO BE IN TOWN FOR TWO WEEKS FOLLOWING SURGERY, THIS IS IMPORTANT SO YOU CAN BE CHECKED FOR DRESSING CHANGES, SUTURE REMOVAL AND TO MONITOR FOR POSSIBLE COMPLICATIONS.   Cerave Cream to back  Wound Care Instructions  Cleanse wound gently with soap and water once a day then pat dry with clean gauze. Apply a thin coat of Petrolatum (petroleum jelly, "Vaseline") over the wound (unless you have an allergy to this). We recommend that you use a new, sterile tube of  Vaseline. Do not pick or remove scabs. Do not remove the yellow or white "healing tissue" from the base of the wound.  Cover the wound with fresh, clean, nonstick gauze and secure with paper tape. You may use Band-Aids in place of gauze and tape if the wound is small enough, but would recommend trimming much of the tape off as there is  often too much. Sometimes Band-Aids can irritate the skin.  You should call the office for your biopsy report after 1 week if you have not already been contacted.  If you experience any problems, such as abnormal amounts of bleeding, swelling, significant bruising, significant pain, or evidence of infection, please call the office immediately.  FOR ADULT SURGERY PATIENTS: If you need something for pain relief you may take 1 extra strength Tylenol (acetaminophen) AND 2 Ibuprofen (200mg  each) together every 4 hours as needed for pain. (do not take these if you are allergic to them or if you have a reason you should not take them.) Typically, you may only need pain medication for 1 to 3 days.    Due to recent changes in healthcare laws, you may see results of your pathology and/or laboratory studies on MyChart before the doctors have had a chance to review them. We understand that in some cases there may be results that are confusing or concerning to you. Please understand that not all results are received at the same time and often the doctors may need to interpret multiple results in order to provide you with the best plan of care or course of treatment. Therefore, we ask that you please give [place] 2 business days to thoroughly review all your results before contacting the office for clarification. Should we see a critical lab result, you will be contacted sooner.   If You Need Anything After Your Visit  If you have any questions or concerns for your doctor, please call our main line at [phone] and press option 4 to reach your doctor's medical assistant. If no one answers, please leave a voicemail as directed and we will return your call as soon as possible. Messages left after 4 pm will be answered the following business day.   You may also send [place] a message via MyChart. We typically respond to MyChart messages within 1-2 business days.  For prescription refills, please ask your pharmacy to  contact our office. Our fax number is [phone].  If you have an urgent issue when the clinic is closed that cannot wait until the next business day, you can page your doctor at the number below.    Please note that while we do our best to be available for urgent issues outside of office hours, we are not available 24/7.   If you have an urgent issue and are unable to reach [place], you may choose to seek medical care at your doctor's office, retail clinic, urgent care center, or emergency room.  If you have a medical emergency, please immediately call 911 or go to the emergency department.  Pager Numbers  - Dr. Gwen Pounds: [phone]  - Dr. Roseanne Reno: [phone]  - Dr. Katrinka Blazing: [phone]   In the event of inclement weather, please call our main line at [phone] for an update on the status of any delays or closures.  Dermatology Medication Tips: Please keep the boxes that topical medications come in in order to help keep track of the instructions about where and how to use these. Pharmacies typically print the medication instructions only  on the boxes and not directly on the medication tubes.   If your medication is too expensive, please contact our office at [phone] option 4 or send [place] a message through MyChart.   We are unable to tell what your co-pay for medications will be in advance as this is different depending on your insurance coverage. However, we may be able to find a substitute medication at lower cost or fill out paperwork to get insurance to cover a needed medication.   If a prior authorization is required to get your medication covered by your insurance company, please allow [place] 1-2 business days to complete this process.  Drug prices often vary depending on where the prescription is filled and some pharmacies may offer cheaper prices.  The website www.goodrx.com contains coupons for medications through different pharmacies. The prices here do not account for what  the cost may be with help from insurance (it may be cheaper with your insurance), but the website can give you the price if you did not use any insurance.  - You can print the associated coupon and take it with your prescription to the pharmacy.  - You may also stop by our office during regular business hours and pick up a GoodRx coupon card.  - If you need your prescription sent electronically to a different pharmacy, notify our office through [facility] or by phone at [phone] option 4.     Si Usted Necesita Algo Despus de Su Visita  Tambin puede enviarnos un mensaje a travs de Clinical cytogeneticist. Por lo general respondemos a los mensajes de MyChart en el transcurso de 1 a 2 das hbiles.  Para renovar recetas, por favor pida a su farmacia que se ponga en contacto con nuestra oficina. Annie Sable de fax es [place] [phone].  Si tiene un asunto urgente cuando la clnica est cerrada y que no puede esperar hasta el siguiente da hbil, puede llamar/localizar a su doctor(a) al nmero que aparece a continuacin.   Por favor, tenga en cuenta que aunque hacemos todo lo posible para estar disponibles para asuntos urgentes fuera del horario de [place], no estamos disponibles las 24 horas del da, los 7 [address]  [address] de la [place].   Si tiene un problema urgente y no puede comunicarse con nosotros, puede optar por buscar atencin mdica  en el consultorio de su doctor(a), en una clnica privada, en un centro de atencin urgente o en una sala de emergencias.  Si tiene Engineer, drilling, por favor llame inmediatamente al 911 o vaya a la sala de emergencias.  Nmeros de bper  - Dr. Gwen Pounds: [phone]  - Dra. Roseanne Reno: [phone]  - Dr. Katrinka Blazing: [phone]   En caso de inclemencias del tiempo, por favor llame a Lacy Duverney principal al [phone] para una actualizacin sobre el [place] de cualquier retraso o cierre.  Consejos para la medicacin en dermatologa: Por favor, guarde las  cajas en las que vienen los medicamentos de uso tpico para ayudarle a seguir las instrucciones sobre dnde y cmo usarlos. Las farmacias generalmente imprimen las instrucciones del medicamento slo en las cajas y no directamente en los tubos del [place].   Si su medicamento es muy caro, por favor, pngase en contacto con Rolm Gala llamando al [phone] y presione la opcin 4 o envenos un mensaje a travs de Clinical cytogeneticist.   No podemos decirle cul ser su copago por los medicamentos por adelantado ya que esto es diferente dependiendo de la cobertura de su seguro. Sin embargo, es  posible que podamos encontrar un medicamento sustituto a Audiological scientist un formulario para que el seguro cubra el medicamento que se considera necesario.   Si se requiere una autorizacin previa para que su compaa de seguros [place] su medicamento, por favor permtanos de 1 a 2 das hbiles para completar [address]  Los precios de los medicamentos varan con frecuencia dependiendo del Environmental consultant de dnde se surte la receta y alguna farmacias pueden ofrecer precios ms baratos.  El sitio web www.goodrx.com tiene cupones para medicamentos de Health and safety inspector. Los precios aqu no tienen en cuenta lo que podra costar con la ayuda del seguro (puede ser ms barato con su seguro), pero el sitio web puede darle el precio si no utiliz Tourist information centre manager.  - Puede imprimir el cupn correspondiente y llevarlo con su receta a la farmacia.  - Tambin puede pasar por nuestra oficina durante el horario de atencin regular y Education officer, museum una tarjeta de cupones de GoodRx.  - Si necesita que su receta se enve electrnicamente a una farmacia diferente, informe a nuestra oficina a travs de MyChart de [place] o por telfono llamando al [phone] y presione la opcin 4.

## 2023-04-13 NOTE — Progress Notes (Signed)
   Follow-Up Visit   Subjective  Vincent Cole is a 72 y.o. male who presents for the following: cyst back pt's son has expressed material inside, pt just noticed a few weeks ago, itching on back, no treatment, skin tag L groin has had for yrs hx of growing past 2 months The patient has spots, moles and lesions to be evaluated, some may be new or changing and the patient may have concern these could be cancer.   The following portions of the chart were reviewed this encounter and updated as appropriate: medications, allergies, medical history  Review of Systems:  No other skin or systemic complaints except as noted in HPI or Assessment and Plan.  Objective  Well appearing patient in no apparent distress; mood and affect are within normal limits.   A focused examination was performed of the following areas: Back, groin  Relevant exam findings are noted in the Assessment and Plan.  L mons 5.54mm pedunculated pink pap        Assessment & Plan   EPIDERMAL INCLUSION CYST, inflamed Exam: Subcutaneous nodule at central upper back  Benign-appearing. Exam most consistent with an epidermal inclusion cyst. Discussed that a cyst is a benign growth that can grow over time and sometimes get irritated or inflamed. Recommend observation if it is not bothersome. Discussed option of surgical excision to remove it if it is growing, symptomatic, or other changes noted. Please call for new or changing lesions so they can be evaluated.  Start Doxycycline 100mg  1 po bid with food and drink x 2 wks Plan to excise in the future if patient desires  Doxycycline should be taken with food to prevent nausea. Do not lay down for 30 minutes after taking. Be cautious with sun exposure and use good sun protection while on this medication. Pregnant women should not take this medication.    Xerosis back - diffuse xerotic patches - recommend gentle, hydrating skin care - gentle skin care handout given -  recommend Cerave cream qd after shower   Neoplasm of skin L mons  Skin / nail biopsy Type of biopsy: tangential   Informed consent: discussed and consent obtained   Timeout: patient name, date of birth, surgical site, and procedure verified   Procedure prep:  Patient was prepped and draped in usual sterile fashion Prep type:  Isopropyl alcohol Anesthesia: the lesion was anesthetized in a standard fashion   Anesthetic:  1% lidocaine w/ epinephrine 1-100,000 buffered w/ 8.4% NaHCO3 Instrument used: DermaBlade   Hemostasis achieved with: pressure and aluminum chloride   Outcome: patient tolerated procedure well   Post-procedure details: sterile dressing applied and wound care instructions given   Dressing type: bandage and petrolatum    Specimen 1 - Surgical pathology Differential Diagnosis: Acrochordon vs Dermal nevus vs SK  Check Margins: No 5.29mm pedunculated pink pap  EIC (epidermal inclusion cyst)  Xerosis cutis    Return for surgery cyst back.  I, Ardis Rowan, RMA, am acting as scribe for Elie Goody, MD .   Documentation: I have reviewed the above documentation for accuracy and completeness, and I agree with the above.  Elie Goody, MD

## 2023-04-16 ENCOUNTER — Encounter: Payer: Self-pay | Admitting: Dermatology

## 2023-04-18 LAB — SURGICAL PATHOLOGY

## 2023-04-20 ENCOUNTER — Other Ambulatory Visit: Payer: Self-pay | Admitting: Family

## 2023-04-27 ENCOUNTER — Other Ambulatory Visit: Payer: Self-pay | Admitting: Physician Assistant

## 2023-04-27 DIAGNOSIS — R413 Other amnesia: Secondary | ICD-10-CM

## 2023-04-27 DIAGNOSIS — R2689 Other abnormalities of gait and mobility: Secondary | ICD-10-CM

## 2023-05-10 ENCOUNTER — Ambulatory Visit
Admission: RE | Admit: 2023-05-10 | Discharge: 2023-05-10 | Disposition: A | Discharge status: Home / Self Care | Payer: Medicare Other | Source: Ambulatory Visit | Attending: Physician Assistant | Admitting: Physician Assistant

## 2023-05-10 DIAGNOSIS — R2689 Other abnormalities of gait and mobility: Secondary | ICD-10-CM

## 2023-05-10 DIAGNOSIS — R413 Other amnesia: Secondary | ICD-10-CM

## 2023-05-17 ENCOUNTER — Other Ambulatory Visit: Payer: Self-pay

## 2023-05-17 ENCOUNTER — Emergency Department
Admission: EM | Admit: 2023-05-17 | Discharge: 2023-05-17 | Disposition: A | Discharge status: Home / Self Care | Payer: Medicare Other | Attending: Emergency Medicine | Admitting: Emergency Medicine

## 2023-05-17 DIAGNOSIS — R339 Retention of urine, unspecified: Secondary | ICD-10-CM | POA: Diagnosis present

## 2023-05-17 DIAGNOSIS — N390 Urinary tract infection, site not specified: Secondary | ICD-10-CM | POA: Insufficient documentation

## 2023-05-17 LAB — URINALYSIS, ROUTINE W REFLEX MICROSCOPIC
Bilirubin Urine: NEGATIVE
Glucose, UA: NEGATIVE mg/dL
Hgb urine dipstick: NEGATIVE
Ketones, ur: NEGATIVE mg/dL
Nitrite: NEGATIVE
Protein, ur: 100 mg/dL — AB
Specific Gravity, Urine: 1.032 — ABNORMAL HIGH (ref 1.005–1.030)
WBC, UA: >50 WBC/hpf (ref 0–5)
pH: 5 (ref 5.0–8.0)

## 2023-05-17 MED ORDER — SULFAMETHOXAZOLE-TRIMETHOPRIM 800-160 MG PO TABS
1.0000 | ORAL_TABLET | Freq: Once | ORAL | Status: AC
  Administered 2023-05-17: 1 via ORAL
  Filled 2023-05-17: qty 1

## 2023-05-17 MED ORDER — SULFAMETHOXAZOLE-TRIMETHOPRIM 800-160 MG PO TABS
1.0000 | ORAL_TABLET | Freq: Two times a day (BID) | ORAL | 0 refills | Status: DC
Start: 2023-05-17 — End: 2023-05-22

## 2023-05-17 NOTE — ED Provider Notes (Cosign Needed)
[facility] Provider Note    Event Date/Time   First MD Initiated Contact with Patient 05/17/23 [phone]     (approximate)   History   Urinary Retention   HPI  Vincent Cole is a 72 y.o. male with history of multiple UTIs, GERD, other medical problems see past medical history presents emergency department with dysuria, some urinary retention, hesitancy, states some fever last night.  Starting to feel tired and weak.  No chest pain or shortness of breath.  States very typical when he has UTI.  Patient states he had a reaction to Levaquin unsure what it was but does do well on Bactrim      Physical Exam   Triage Vital Signs: ED Triage Vitals  Encounter Vitals Group     BP 05/17/23 0849 111/78     Systolic BP Percentile --      Diastolic BP Percentile --      Pulse Rate 05/17/23 0849 93     Resp 05/17/23 0849 20     Temp 05/17/23 0849 99 F (37.2 C)     Temp Source 05/17/23 0849 Oral     SpO2 05/17/23 0849 97 %     Weight 05/17/23 0846 200 lb (90.7 kg)     Height 05/17/23 0846 5\' 4"  (1.626 m)     Head Circumference --      Peak Flow --      Pain Score 05/17/23 0846 4     Pain Loc --      Pain Education --      Exclude from Growth Chart --     Most recent vital signs: Vitals:   05/17/23 0849  BP: 111/78  Pulse: 93  Resp: 20  Temp: 99 F (37.2 C)  SpO2: 97%     General: Awake, no distress.   CV:  Good peripheral perfusion. regular rate and  rhythm Resp:  Normal effort.  Abd:  No distention.   Other:      ED Results / Procedures / Treatments   Labs (all labs ordered are listed, but only abnormal results are displayed) Labs Reviewed  URINALYSIS, ROUTINE W REFLEX MICROSCOPIC - Abnormal; Notable for the following components:      Result Value   Color, Urine AMBER (*)    APPearance CLOUDY (*)    Specific Gravity, Urine 1.032 (*)    Protein, ur 100 (*)    Leukocytes,Ua LARGE (*)    Bacteria, UA FEW (*)    All other components  within normal limits  URINE CULTURE     EKG     RADIOLOGY     PROCEDURES:   Procedures   MEDICATIONS ORDERED IN ED: Medications  sulfamethoxazole-trimethoprim (BACTRIM DS) 800-160 MG per tablet 1 tablet (has no administration in time range)     IMPRESSION / MDM / ASSESSMENT AND PLAN / ED COURSE  I reviewed the triage vital signs and the nursing notes.                              Differential diagnosis includes, but is not limited to, UTI, urinary retention, prostatitis  Patient's presentation is most consistent with acute illness / injury with system symptoms.   UA shows large amount of leuks, white blood cell clumps, hyaline cast indicative of UTI  Place patient on Bactrim, did a urine culture.  Patient is follow-up with his doctor.  Return emergency department worsening.  In agreement treatment plan.  Discharged stable condition.      FINAL CLINICAL IMPRESSION(S) / ED DIAGNOSES   Final diagnoses:  Acute UTI     Rx / DC Orders   ED Discharge Orders          Ordered    sulfamethoxazole-trimethoprim (BACTRIM DS) 800-160 MG tablet  2 times daily        05/17/23 9604             Note:  This document was prepared using Dragon voice recognition software and may include unintentional dictation errors.    Faythe Ghee, PA-C 05/17/23 [phone]

## 2023-05-17 NOTE — ED Triage Notes (Signed)
Pt to ED POV from home for difficulty urinating since 2 days. Urinated small amounts this AM and last night despite drinking water. Noticed hematuria this AM.   States before this was having urinary burning since 1 month. States he also feels kind of run down.   Attempted bladder scan several times, unable to get reading other than "0mL'. Bladder not distended on palpation.

## 2023-05-17 NOTE — ED Notes (Addendum)
See triage note  States he has had some urinary issues for several days  Subjective fever at home  Presents with low grade temp.  Also feels like he is not voiding enough  Fatigued

## 2023-05-19 LAB — URINE CULTURE: Culture: >=100000 — AB

## 2023-05-21 NOTE — Progress Notes (Signed)
ED Antimicrobial Stewardship Positive Culture Follow Up   Vincent Cole is an 72 y.o. male who presented to [facility] on 05/17/2023 with a chief complaint of  Chief Complaint  Patient presents with   Urinary Retention    Recent Results (from the past 720 hours)  Urine Culture     Status: Abnormal   Collection Time: 05/17/23  8:54 AM   Specimen: Urine, Clean Catch  Result Value Ref Range Status   Specimen Description   Final    URINE, CLEAN CATCH Performed at [facility], [address], [place], [place] [address]    Special Requests   Final    NONE Performed at [facility], [address] [place], [place] [address]    Culture >=100,000 COLONIES/mL ESCHERICHIA COLI (A)  Final   Report Status 05/19/2023 FINAL  Final   Organism ID, Bacteria ESCHERICHIA COLI (A)  Final      Susceptibility   Escherichia coli - MIC*    AMPICILLIN >=32 RESISTANT Resistant     CEFAZOLIN <=4 SENSITIVE Sensitive     CEFEPIME <=0.12 SENSITIVE Sensitive     CEFTRIAXONE <=0.25 SENSITIVE Sensitive     CIPROFLOXACIN >=4 RESISTANT Resistant     GENTAMICIN <=1 SENSITIVE Sensitive     IMIPENEM <=0.25 SENSITIVE Sensitive     NITROFURANTOIN <=16 SENSITIVE Sensitive     TRIMETH/SULFA >=320 RESISTANT Resistant     AMPICILLIN/SULBACTAM 16 INTERMEDIATE Intermediate     PIP/TAZO <=4 SENSITIVE Sensitive ug/mL    * >=100,000 COLONIES/mL ESCHERICHIA COLI    [x]  Treated with Bactrim, organism resistant to prescribed antimicrobial  New antibiotic prescription: Cefadroxil 1000 mg po BID x 10 days  ED Provider: Shaune Pollack  12/29  called pt [phone].  Preferred pharmacy is [facility] @ [address]  [phone].   Called in above prescription to voicemail Rx.  Explained to stop taking Bactrim and start Cefadroxil.   Bari Mantis PharmD Clinical Pharmacist 05/21/2023

## 2023-05-22 ENCOUNTER — Encounter: Payer: Self-pay | Admitting: Internal Medicine

## 2023-05-22 ENCOUNTER — Ambulatory Visit (INDEPENDENT_AMBULATORY_CARE_PROVIDER_SITE_OTHER): Payer: Medicare Other | Admitting: Internal Medicine

## 2023-05-22 VITALS — BP 100/66 | HR 94 | Ht 64.5 in | Wt 203.2 lb

## 2023-05-22 DIAGNOSIS — F330 Major depressive disorder, recurrent, mild: Secondary | ICD-10-CM | POA: Diagnosis not present

## 2023-05-22 DIAGNOSIS — I10 Essential (primary) hypertension: Secondary | ICD-10-CM | POA: Diagnosis not present

## 2023-05-22 DIAGNOSIS — N3000 Acute cystitis without hematuria: Secondary | ICD-10-CM | POA: Diagnosis not present

## 2023-05-22 DIAGNOSIS — F411 Generalized anxiety disorder: Secondary | ICD-10-CM

## 2023-05-22 DIAGNOSIS — N401 Enlarged prostate with lower urinary tract symptoms: Secondary | ICD-10-CM

## 2023-05-22 DIAGNOSIS — N138 Other obstructive and reflux uropathy: Secondary | ICD-10-CM

## 2023-05-22 DIAGNOSIS — M545 Low back pain, unspecified: Secondary | ICD-10-CM

## 2023-05-22 DIAGNOSIS — Z0000 Encounter for general adult medical examination without abnormal findings: Secondary | ICD-10-CM | POA: Diagnosis not present

## 2023-05-22 MED ORDER — DULOXETINE HCL 60 MG PO CPEP: 60.0000 mg | ORAL_CAPSULE | Freq: Every day | ORAL | 2 refills | Status: DC

## 2023-05-22 MED ORDER — NITROFURANTOIN MONOHYD MACRO 100 MG PO CAPS: 100.0000 mg | ORAL_CAPSULE | Freq: Two times a day (BID) | ORAL | 0 refills | Status: AC

## 2023-05-22 MED ORDER — LORAZEPAM 0.5 MG PO TABS: ORAL_TABLET | ORAL | 0 refills | Status: DC

## 2023-05-22 MED ORDER — FUROSEMIDE 20 MG PO TABS: 20.0000 mg | ORAL_TABLET | Freq: Every day | ORAL | 3 refills | Status: DC

## 2023-05-22 NOTE — Progress Notes (Signed)
Established Patient Office Visit  Subjective:  Patient ID: Vincent Cole, male    DOB: [birth year]  Age: 72 y.o. MRN: [id number]  Chief Complaint  Patient presents with   Follow-up    Discuss medications    Patient comes in for his annual wellness visits and also has some other complaints.  He was recently seen in the emergency room for a urinary tract infection and started on p.o. Bactrim.  However the culture results showed growth of E. coli which was resistant to Bactrim.  Patient is still having symptoms.  The organism is sensitive to [facility], will send in a new prescription. Patient also reports that he stopped his anxiety and depression medications, Viibryd and buspirone and was doing well for decent amount of time.  However now he is starting to feel depressed and anxious again. His PHQ-9/GAD score is 18/15. CFS is 0. He is up-to-date on his colonoscopy.    No other concerns at this time.   Past Medical History:  Diagnosis Date   Abdominal pain 01/17/2011   Formatting of this note might be different from the original.  Note: Unchanged - chronic  suprapubic/lower abdominal area since TURP in 2005; urology has now diagnosed, after extensive evaluation, interstitial cystitis; has also had repeat TURP in June 2010, and a surgery on the neck of the bladder in July of 2011     Actinic keratosis    Arthritis    Benign bladder mass    Complete tear of left rotator cuff 04/19/2017   Complication of anesthesia    PONV x 1 in 2010   Depression    Frequency of urination    GERD (gastroesophageal reflux disease) no meds   Heart murmur    was told many years ago   History of DVT of lower extremity 02/03/2020   History of recurrent UTIs    Hypertension    IC (interstitial cystitis)    Nocturia    OSA on CPAP nightly   Pelvic pain in male    PONV (postoperative nausea and vomiting)    Simple renal cyst    Urgency of urination    Urinary hesitancy     Past Surgical History:   Procedure Laterality Date   BUNIONECTOMY Right 01/15/2021   Procedure: Flavia Shipper;  Surgeon: Gwyneth Revels, DPM;  Location: [facility];  Service: Podiatry;  Laterality: Right;   COLONOSCOPY     COLONOSCOPY WITH PROPOFOL N/A 06/25/2021   Procedure: COLONOSCOPY WITH PROPOFOL;  Surgeon: Regis Bill, MD;  Location: [facility];  Service: Endoscopy;  Laterality: N/A;   CORRECTION OVERLAPPING TOES Right 01/15/2021   Procedure: CORRECTION OVERLAPPING TOES;  Surgeon: Gwyneth Revels, DPM;  Location: [facility];  Service: Podiatry;  Laterality: Right;   CYSTO WITH HYDRODISTENSION  06/02/2011   Procedure: CYSTOSCOPY/HYDRODISTENSION;  Surgeon: Martina Sinner, MD;  Location: [facility];  Service: Urology;  Laterality: N/A;  catheter placement   HAMMER TOE SURGERY Right 01/15/2021   Procedure: HAMMER TOE CORRECTION;  Surgeon: Gwyneth Revels, DPM;  Location: [facility];  Service: Podiatry;  Laterality: Right;   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   JOINT REPLACEMENT     shoulder, left knee   KNEE ARTHROSCOPY     TRANSURETHRAL RESECTION OF PROSTATE  X3   LAST ONE MAY 2012   WEIL OSTEOTOMY Right 01/15/2021   Procedure: WEIL;  Surgeon: Gwyneth Revels, DPM;  Location: [facility];  Service: Podiatry;  Laterality: Right;    Social History  Socioeconomic History   Marital status: Married    Spouse name: Not on file   Number of children: 1   Years of education: Not on file   Highest education level: Not on file  Occupational History   Not on file  Tobacco Use   Smoking status: Never   Smokeless tobacco: Never  Vaping Use   Vaping status: Never Used  Substance and Sexual Activity   Alcohol use: Yes    Comment: occ   Drug use: No   Sexual activity: Not on file  Other Topics Concern   Not on file  Social History Narrative   Not on file   Social Drivers of Health   Financial Resource Strain: Not on file  Food Insecurity: No Food Insecurity  (05/22/2023)   Hunger Vital Sign    Worried About Running Out of Food in the Last Year: Never true    Ran Out of Food in the Last Year: Never true  Transportation Needs: No Transportation Needs (05/22/2023)   PRAPARE - Administrator, Civil Service (Medical): No    Lack of Transportation (Non-Medical): No  Physical Activity: Not on file  Stress: No Stress Concern Present (05/22/2023)   [employer] of Occupational Health - Occupational Stress Questionnaire    Feeling of Stress : Only a little  Social Connections: Not on file  Intimate Partner Violence: Not At Risk (05/22/2023)   Humiliation, Afraid, Rape, and Kick questionnaire    Fear of Current or Ex-Partner: No    Emotionally Abused: No    Physically Abused: No    Sexually Abused: No    Family History  Problem Relation Age of Onset   Heart disease Mother     No Known Allergies  Outpatient Medications Prior to Visit  Medication Sig   acetaminophen (TYLENOL) 500 MG tablet Take 2 tablets (1,000 mg total) by mouth every 6 (six) hours as needed.   amLODipine-benazepril (LOTREL) 5-40 MG capsule TAKE 1 CAPSULE BY MOUTH EVERY DAY   aspirin EC 81 MG tablet Take 81 mg by mouth daily. Swallow whole.   Azelastine HCl 137 MCG/SPRAY SOLN USE 2 SPRAYS IN EACH NOSTRIL AT NIGHT   fluticasone (FLONASE) 50 MCG/ACT nasal spray Place 1-2 sprays into both nostrils at bedtime.   gabapentin (NEURONTIN) 300 MG capsule TAKE 1 CAPSULE BY MOUTH THREE TIMES A DAY   hydrocortisone 2.5 % cream Apply topically to aa of face t- thur- sat nightly   hydrOXYzine (ATARAX) 25 MG tablet Take 25 mg by mouth 3 (three) times daily.   meloxicam (MOBIC) 15 MG tablet Take 15 mg by mouth in the morning.   Omega-3 Fatty Acids (OMEGA 3 PO) Take 1 capsule by mouth once a week.   tiZANidine (ZANAFLEX) 4 MG tablet Take 4 mg by mouth at bedtime.   traZODone (DESYREL) 50 MG tablet Take 50 mg by mouth at bedtime as needed.   [DISCONTINUED] furosemide (LASIX)  20 MG tablet TAKE 1 TABLET BY MOUTH EVERY DAY   [DISCONTINUED] LORazepam (ATIVAN) 0.5 MG tablet TAKE 1 TABLET BY MOUTH EVERY DAY AT BEDTIME AS NEEDED   ondansetron (ZOFRAN-ODT) 4 MG disintegrating tablet Take 1 tablet (4 mg total) by mouth every 8 (eight) hours as needed for nausea or vomiting. (Patient not taking: Reported on 05/22/2023)   [DISCONTINUED] sulfamethoxazole-trimethoprim (BACTRIM DS) 800-160 MG tablet Take 1 tablet by mouth 2 (two) times daily. (Patient not taking: Reported on 05/22/2023)   No facility-administered medications prior to visit.  Review of Systems  Constitutional:  Positive for malaise/fatigue. Negative for chills, fever and weight loss.  HENT: Negative.  Negative for hearing loss, sinus pain and sore throat.   Eyes: Negative.  Negative for blurred vision.  Respiratory: Negative.  Negative for cough and shortness of breath.   Cardiovascular: Negative.  Negative for chest pain, palpitations and leg swelling.  Gastrointestinal: Negative.  Negative for abdominal pain, blood in stool, constipation, diarrhea, heartburn, melena, nausea and vomiting.  Genitourinary: Negative.  Negative for dysuria and flank pain.  Musculoskeletal: Negative.  Negative for joint pain and myalgias.  Skin: Negative.   Neurological: Negative.  Negative for dizziness and headaches.  Endo/Heme/Allergies: Negative.   Psychiatric/Behavioral:  Positive for depression. Negative for suicidal ideas. The patient is nervous/anxious and has insomnia.        Objective:   BP 100/66   Pulse 94   Ht 5' 4.5" (1.638 m)   Wt 203 lb 3.2 oz (92.2 kg)   SpO2 95%   BMI 34.34 kg/m   Vitals:   05/22/23 1006  BP: 100/66  Pulse: 94  Height: 5' 4.5" (1.638 m)  Weight: 203 lb 3.2 oz (92.2 kg)  SpO2: 95%  BMI (Calculated): 34.35    Physical Exam Vitals and nursing note reviewed.  Constitutional:      Appearance: Normal appearance.  HENT:     Head: Normocephalic and atraumatic.     Nose: Nose  normal.     Mouth/Throat:     Mouth: Mucous membranes are moist.     Pharynx: Oropharynx is clear.  Eyes:     Conjunctiva/sclera: Conjunctivae normal.     Pupils: Pupils are equal, round, and reactive to light.  Cardiovascular:     Rate and Rhythm: Normal rate and regular rhythm.     Pulses: Normal pulses.     Heart sounds: Normal heart sounds.  Pulmonary:     Effort: Pulmonary effort is normal.     Breath sounds: Normal breath sounds.  Abdominal:     General: Bowel sounds are normal.     Palpations: Abdomen is soft.  Musculoskeletal:        General: Normal range of motion.     Cervical back: Normal range of motion.  Skin:    General: Skin is warm and dry.  Neurological:     General: No focal deficit present.     Mental Status: He is alert and oriented to person, place, and time.  Psychiatric:        Mood and Affect: Mood normal.        Behavior: Behavior normal.        Judgment: Judgment normal.      No results found for any visits on 05/22/23.  Recent Results (from the past 2160 hours)  Surgical pathology     Status: None   Collection Time: 04/13/23 12:00 AM  Result Value Ref Range   SURGICAL PATHOLOGY      SURGICAL PATHOLOGY [facility] [address] [place], [place] [address] Telephone [phone] or [phone] Fax [phone]  REPORT OF DERMATOPATHOLOGY   Accession #: [id number] Patient Name: SVEN, TRIBE Visit # : [id number]  MRN: [id number] Cytotechnologist: Thomasene Lot, Dermatopathologist, Electronic Signature DOB/Age [birth year] (Age: 64) Gender: M Collected Date: 04/13/2023 Received Date: 04/14/2023  FINAL DIAGNOSIS       1. Skin, left mons :       SEBORRHEIC KERATOSIS, IRRITATED       DATE SIGNED  OUT: 04/18/2023 ELECTRONIC SIGNATURE : Depcik-Smith Md, Natalie, Dermatopathologist, Electronic Signature  MICROSCOPIC DESCRIPTION 1. There is acanthosis with keratinocytic proliferation in  the epidermis and hyperkeratosis. The keratinocytes are slightly enlarged without atypia. A reactive inflammatory infiltrate is present.  The changes are those of an irritated seborrheic keratosis.  CASE COMMENTS  STAINS USED IN DIAGNOSIS: H&E    CLINICAL HISTORY  SPECIMEN(S) OBTAINED 1. Skin, Left Mons  SPECIMEN COMMENTS: 1. 5.27mm pedunculated pink pap SPECIMEN CLINICAL INFORMATION: 1. Neoplasm of skin, acrochordon vs dermal nevus vs SK    Gross Description 1. Formalin fixed specimen received:  6 X 3 X 2 MM, TOTO (2 P) (1 B) ( hwc )        Report signed out from the following location(s) [place] [address], [place] [address] CLIA #: [id number]  [facility] [address] [place], [place] [address] CLIA #: [id number]   Urinalysis, Routine w reflex microscopic -Urine, Clean Catch     Status: Abnormal   Collection Time: 05/17/23  8:54 AM  Result Value Ref Range   Color, Urine AMBER (A) YELLOW    Comment: BIOCHEMICALS MAY BE AFFECTED BY COLOR   APPearance CLOUDY (A) CLEAR   Specific Gravity, Urine 1.032 (H) 1.005 - 1.030   pH 5.0 5.0 - 8.0   Glucose, UA NEGATIVE NEGATIVE mg/dL   Hgb urine dipstick NEGATIVE NEGATIVE   Bilirubin Urine NEGATIVE NEGATIVE   Ketones, ur NEGATIVE NEGATIVE mg/dL   Protein, ur 628 (A) NEGATIVE mg/dL   Nitrite NEGATIVE NEGATIVE   Leukocytes,Ua LARGE (A) NEGATIVE   RBC / HPF 11-20 0 - 5 RBC/hpf   WBC, UA >50 0 - 5 WBC/hpf   Bacteria, UA FEW (A) NONE SEEN   Squamous Epithelial / HPF 11-20 0 - 5 /HPF   WBC Clumps PRESENT    Mucus PRESENT    Hyaline Casts, UA PRESENT     Comment: Performed at [facility], [address] [place], [place] [address]  Urine Culture     Status: Abnormal   Collection Time: 05/17/23  8:54 AM   Specimen: Urine, Clean Catch  Result Value Ref Range   Specimen Description      URINE, CLEAN CATCH Performed at [facility], [address] [place], [place] [address]    Special Requests      NONE Performed at [facility], [address] [place], [place] [address]    Culture >=100,000 COLONIES/mL ESCHERICHIA COLI (A)    Report Status 05/19/2023 FINAL    Organism ID, Bacteria ESCHERICHIA COLI (A)       Susceptibility   Escherichia coli - MIC*    AMPICILLIN >=32 RESISTANT Resistant     CEFAZOLIN <=4 SENSITIVE Sensitive     CEFEPIME <=0.12 SENSITIVE Sensitive     CEFTRIAXONE <=0.25 SENSITIVE Sensitive     CIPROFLOXACIN >=4 RESISTANT Resistant     GENTAMICIN <=1 SENSITIVE Sensitive     IMIPENEM <=0.25 SENSITIVE Sensitive     NITROFURANTOIN <=16 SENSITIVE Sensitive     TRIMETH/SULFA >=320 RESISTANT Resistant     AMPICILLIN/SULBACTAM 16 INTERMEDIATE Intermediate     PIP/TAZO <=4 SENSITIVE Sensitive ug/mL    * >=100,000 COLONIES/mL ESCHERICHIA COLI      Assessment & Plan:  Start duloxetine. Macrobid for UTI. Will check labs at next visit. Problem List Items Addressed This Visit     Essential hypertension, benign   Relevant Medications   furosemide (LASIX) 20  MG tablet   Benign prostatic hyperplasia with urinary obstruction   Low back pain   Relevant Medications   tiZANidine (ZANAFLEX) 4 MG tablet   Other Visit Diagnoses       Medicare annual wellness visit, subsequent    -  Primary     Acute cystitis without hematuria       Relevant Medications   nitrofurantoin, macrocrystal-monohydrate, (MACROBID) 100 MG capsule     Mild episode of recurrent major depressive disorder (HCC)       Relevant Medications   traZODone (DESYREL) 50 MG tablet   hydrOXYzine (ATARAX) 25 MG tablet   DULoxetine (CYMBALTA) 60 MG capsule   LORazepam (ATIVAN) 0.5 MG tablet     GAD (generalized anxiety disorder)       Relevant Medications   traZODone (DESYREL) 50 MG tablet   hydrOXYzine (ATARAX) 25 MG tablet   DULoxetine (CYMBALTA) 60 MG capsule   LORazepam (ATIVAN) 0.5 MG tablet       Return in about 2 weeks (around  06/05/2023).   Total time spent: 30 minutes  Margaretann Loveless, MD  05/22/2023   This document may have been prepared by [facility] Voice Recognition software and as such may include unintentional dictation errors.

## 2023-06-01 DIAGNOSIS — M48061 Spinal stenosis, lumbar region without neurogenic claudication: Secondary | ICD-10-CM | POA: Insufficient documentation

## 2023-06-05 ENCOUNTER — Encounter: Payer: Self-pay | Admitting: Internal Medicine

## 2023-06-05 ENCOUNTER — Ambulatory Visit (INDEPENDENT_AMBULATORY_CARE_PROVIDER_SITE_OTHER): Payer: Medicare Other | Admitting: Internal Medicine

## 2023-06-05 VITALS — BP 132/88 | HR 68 | Ht 64.5 in | Wt 198.6 lb

## 2023-06-05 DIAGNOSIS — R3989 Other symptoms and signs involving the genitourinary system: Secondary | ICD-10-CM

## 2023-06-05 DIAGNOSIS — I10 Essential (primary) hypertension: Secondary | ICD-10-CM | POA: Diagnosis not present

## 2023-06-05 DIAGNOSIS — R7303 Prediabetes: Secondary | ICD-10-CM

## 2023-06-05 DIAGNOSIS — E782 Mixed hyperlipidemia: Secondary | ICD-10-CM

## 2023-06-05 DIAGNOSIS — F411 Generalized anxiety disorder: Secondary | ICD-10-CM | POA: Insufficient documentation

## 2023-06-05 LAB — POCT URINALYSIS DIPSTICK
Bilirubin, UA: NEGATIVE
Glucose, UA: NEGATIVE
Ketones, UA: NEGATIVE
Leukocytes, UA: NEGATIVE
Nitrite, UA: NEGATIVE
Protein, UA: NEGATIVE
Spec Grav, UA: 1.015 (ref 1.010–1.025)
Urobilinogen, UA: 0.2 U/dL
pH, UA: 7 (ref 5.0–8.0)

## 2023-06-05 NOTE — Progress Notes (Signed)
 Established Patient Office Visit  Subjective:  Patient ID: Vincent Cole, male    DOB: [birth year]  Age: 73 y.o. MRN: [id number]  Chief Complaint  Patient presents with   Follow-up    2 week follow up    Patient comes in for his follow-up today.  He has completed antibiotic for his UTI and his repeat urine dipstick is all clear.  He is also taking Cymbalta  and notes an improvement.  His PHQ-9/GAD score has improved to 15/8. He is having some blurry vision and is planning to get his eyes examined.    No other concerns at this time.   Past Medical History:  Diagnosis Date   Abdominal pain 01/17/2011   Formatting of this note might be different from the original.  Note: Unchanged - chronic  suprapubic/lower abdominal area since TURP in 2005; urology has now diagnosed, after extensive evaluation, interstitial cystitis; has also had repeat TURP in June 2010, and a surgery on the neck of the bladder in July of 2011     Actinic keratosis    Arthritis    Benign bladder mass    Complete tear of left rotator cuff 04/19/2017   Complication of anesthesia    PONV x 1 in 2010   Depression    Frequency of urination    GERD (gastroesophageal reflux disease) no meds   Heart murmur    was told many years ago   History of DVT of lower extremity 02/03/2020   History of recurrent UTIs    Hypertension    IC (interstitial cystitis)    Nocturia    OSA on CPAP nightly   Pelvic pain in male    PONV (postoperative nausea and vomiting)    Simple renal cyst    Urgency of urination    Urinary hesitancy     Past Surgical History:  Procedure Laterality Date   BUNIONECTOMY Right 01/15/2021   Procedure: ZELL HAI;  Surgeon: Ashley Soulier, DPM;  Location: [facility];  Service: Podiatry;  Laterality: Right;   COLONOSCOPY     COLONOSCOPY WITH PROPOFOL  N/A 06/25/2021   Procedure: COLONOSCOPY WITH PROPOFOL ;  Surgeon: Maryruth Ole DASEN, MD;  Location: [facility];  Service: Endoscopy;   Laterality: N/A;   CORRECTION OVERLAPPING TOES Right 01/15/2021   Procedure: CORRECTION OVERLAPPING TOES;  Surgeon: Ashley Soulier, DPM;  Location: [facility];  Service: Podiatry;  Laterality: Right;   CYSTO WITH HYDRODISTENSION  06/02/2011   Procedure: CYSTOSCOPY/HYDRODISTENSION;  Surgeon: Glendia DELENA Elizabeth, MD;  Location: [facility];  Service: Urology;  Laterality: N/A;  catheter placement   HAMMER TOE SURGERY Right 01/15/2021   Procedure: HAMMER TOE CORRECTION;  Surgeon: Ashley Soulier, DPM;  Location: [facility];  Service: Podiatry;  Laterality: Right;   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   JOINT REPLACEMENT     shoulder, left knee   KNEE ARTHROSCOPY     TRANSURETHRAL RESECTION OF PROSTATE  X3   LAST ONE MAY 2012   WEIL OSTEOTOMY Right 01/15/2021   Procedure: WEIL;  Surgeon: Ashley Soulier, DPM;  Location: [facility];  Service: Podiatry;  Laterality: Right;    Social History   Socioeconomic History   Marital status: Married    Spouse name: Not on file   Number of children: 1   Years of education: Not on file   Highest education level: Not on file  Occupational History   Not on file  Tobacco Use   Smoking status: Never   Smokeless tobacco:  Never  Vaping Use   Vaping status: Never Used  Substance and Sexual Activity   Alcohol use: Yes    Comment: occ   Drug use: No   Sexual activity: Not on file  Other Topics Concern   Not on file  Social History Narrative   Not on file   Social Drivers of Health   Financial Resource Strain: Not on file  Food Insecurity: No Food Insecurity (05/22/2023)   Hunger Vital Sign    Worried About Running Out of Food in the Last Year: Never true    Ran Out of Food in the Last Year: Never true  Transportation Needs: No Transportation Needs (05/22/2023)   PRAPARE - Administrator, Civil Service (Medical): No    Lack of Transportation (Non-Medical): No  Physical Activity: Not on file  Stress: No Stress Concern  Present (05/22/2023)   [employer] of Occupational Health - Occupational Stress Questionnaire    Feeling of Stress : Only a little  Social Connections: Not on file  Intimate Partner Violence: Not At Risk (05/22/2023)   Humiliation, Afraid, Rape, and Kick questionnaire    Fear of Current or Ex-Partner: No    Emotionally Abused: No    Physically Abused: No    Sexually Abused: No    Family History  Problem Relation Age of Onset   Heart disease Mother     No Known Allergies  Outpatient Medications Prior to Visit  Medication Sig   acetaminophen  (TYLENOL ) 500 MG tablet Take 2 tablets (1,000 mg total) by mouth every 6 (six) hours as needed.   amLODipine -benazepril  (LOTREL) 5-40 MG capsule TAKE 1 CAPSULE BY MOUTH EVERY DAY   aspirin  EC 81 MG tablet Take 81 mg by mouth daily. Swallow whole.   Azelastine HCl 137 MCG/SPRAY SOLN USE 2 SPRAYS IN EACH NOSTRIL AT NIGHT   DULoxetine  (CYMBALTA ) 60 MG capsule Take 1 capsule (60 mg total) by mouth daily.   fluticasone (FLONASE) 50 MCG/ACT nasal spray Place 1-2 sprays into both nostrils at bedtime.   furosemide  (LASIX ) 20 MG tablet Take 1 tablet (20 mg total) by mouth daily.   gabapentin  (NEURONTIN ) 300 MG capsule TAKE 1 CAPSULE BY MOUTH THREE TIMES A DAY   hydrocortisone  2.5 % cream Apply topically to aa of face t- thur- sat nightly   hydrOXYzine  (ATARAX ) 25 MG tablet Take 25 mg by mouth 3 (three) times daily.   LORazepam  (ATIVAN ) 0.5 MG tablet Take 1/2 a tab at bedtime prn anxiety   meloxicam  (MOBIC ) 15 MG tablet Take 15 mg by mouth in the morning.   Omega-3 Fatty Acids (OMEGA 3 PO) Take 1 capsule by mouth once a week.   ondansetron  (ZOFRAN -ODT) 4 MG disintegrating tablet Take 1 tablet (4 mg total) by mouth every 8 (eight) hours as needed for nausea or vomiting.   tiZANidine (ZANAFLEX) 4 MG tablet Take 4 mg by mouth at bedtime.   traZODone  (DESYREL ) 50 MG tablet Take 50 mg by mouth at bedtime as needed.   No facility-administered  medications prior to visit.    Review of Systems  Constitutional: Negative.  Negative for chills, fever, malaise/fatigue and weight loss.  HENT: Negative.  Negative for congestion, nosebleeds and sinus pain.   Eyes: Negative.   Respiratory: Negative.  Negative for cough and shortness of breath.   Cardiovascular: Negative.  Negative for chest pain, palpitations and leg swelling.  Gastrointestinal: Negative.  Negative for abdominal pain, constipation, diarrhea, heartburn, nausea and vomiting.  Genitourinary: Negative.  Negative for dysuria and flank pain.  Musculoskeletal: Negative.  Negative for joint pain and myalgias.  Skin: Negative.   Neurological: Negative.  Negative for dizziness, tingling and headaches.  Endo/Heme/Allergies: Negative.   Psychiatric/Behavioral: Negative.  Negative for depression and suicidal ideas. The patient is not nervous/anxious.        Objective:   BP 132/88   Pulse 68   Ht 5' 4.5 (1.638 m)   Wt 198 lb 9.6 oz (90.1 kg)   SpO2 97%   BMI 33.56 kg/m   Vitals:   06/05/23 1032  BP: 132/88  Pulse: 68  Height: 5' 4.5 (1.638 m)  Weight: 198 lb 9.6 oz (90.1 kg)  SpO2: 97%  BMI (Calculated): 33.58    Physical Exam Vitals and nursing note reviewed.  Constitutional:      Appearance: Normal appearance.  HENT:     Head: Normocephalic and atraumatic.     Nose: Nose normal.     Mouth/Throat:     Mouth: Mucous membranes are moist.     Pharynx: Oropharynx is clear.  Eyes:     Conjunctiva/sclera: Conjunctivae normal.     Pupils: Pupils are equal, round, and reactive to light.  Cardiovascular:     Rate and Rhythm: Normal rate and regular rhythm.     Pulses: Normal pulses.     Heart sounds: Normal heart sounds.  Pulmonary:     Effort: Pulmonary effort is normal.     Breath sounds: Normal breath sounds.  Abdominal:     General: Bowel sounds are normal.     Palpations: Abdomen is soft.  Musculoskeletal:        General: Normal range of motion.      Cervical back: Normal range of motion.  Skin:    General: Skin is warm and dry.  Neurological:     General: No focal deficit present.     Mental Status: He is alert and oriented to person, place, and time.  Psychiatric:        Mood and Affect: Mood normal.        Behavior: Behavior normal.        Judgment: Judgment normal.      Results for orders placed or performed in visit on 06/05/23  POCT Urinalysis Dipstick (81002)  Result Value Ref Range   Color, UA yellow    Clarity, UA clear    Glucose, UA Negative Negative   Bilirubin, UA neg    Ketones, UA neg    Spec Grav, UA 1.015 1.010 - 1.025   Blood, UA trace    pH, UA 7.0 5.0 - 8.0   Protein, UA Negative Negative   Urobilinogen, UA 0.2 0.2 or 1.0 E.U./dL   Nitrite, UA neg    Leukocytes, UA Negative Negative   Appearance clear    Odor no     Recent Results (from the past 2160 hours)  Surgical pathology     Status: None   Collection Time: 04/13/23 12:00 AM  Result Value Ref Range   SURGICAL PATHOLOGY      SURGICAL PATHOLOGY [facility] [address], [address] [place], [place] [address] Telephone [phone] or [phone] Fax [phone]  REPORT OF DERMATOPATHOLOGY   Accession #: [id number] Patient Name: MASYN, ROSTRO Visit # : [id number]  MRN: [id number] Cytotechnologist: Ephriam Rolla Edelman, Dermatopathologist, Electronic Signature DOB/Age [birth year] (Age: 59) Gender: M Collected Date: 04/13/2023 Received Date: 04/14/2023  FINAL DIAGNOSIS       1. Skin, left  mons :       SEBORRHEIC KERATOSIS, IRRITATED       DATE SIGNED OUT: 04/18/2023 ELECTRONIC SIGNATURE : Depcik-Smith Md, Natalie, Dermatopathologist, Electronic Signature  MICROSCOPIC DESCRIPTION 1. There is acanthosis with keratinocytic proliferation in the epidermis and hyperkeratosis. The keratinocytes are slightly enlarged without atypia. A reactive inflammatory infiltrate is present.  The changes are those  of an irritated seborrheic keratosis.  CASE COMMENTS  STAINS USED IN DIAGNOSIS: H&E    CLINICAL HISTORY  SPECIMEN(S) OBTAINED 1. Skin, Left Mons  SPECIMEN COMMENTS: 1. 5.4mm pedunculated pink pap SPECIMEN CLINICAL INFORMATION: 1. Neoplasm of skin, acrochordon vs dermal nevus vs SK    Gross Description 1. Formalin fixed specimen received:  6 X 3 X 2 MM, TOTO (2 P) (1 B) ( hwc )        Report signed out from the following location(s) [place] [address] [place], [place] [address] CLIA #: [id number]  [facility] [address] [place], [place] [address] CLIA #: [id number]   Urinalysis, Routine w reflex microscopic -Urine, Clean Catch     Status: Abnormal   Collection Time: 05/17/23  8:54 AM  Result Value Ref Range   Color, Urine AMBER (A) YELLOW    Comment: BIOCHEMICALS MAY BE AFFECTED BY COLOR   APPearance CLOUDY (A) CLEAR   Specific Gravity, Urine 1.032 (H) 1.005 - 1.030   pH 5.0 5.0 - 8.0   Glucose, UA NEGATIVE NEGATIVE mg/dL   Hgb urine dipstick NEGATIVE NEGATIVE   Bilirubin Urine NEGATIVE NEGATIVE   Ketones, ur NEGATIVE NEGATIVE mg/dL   Protein, ur 899 (A) NEGATIVE mg/dL   Nitrite NEGATIVE NEGATIVE   Leukocytes,Ua LARGE (A) NEGATIVE   RBC / HPF 11-20 0 - 5 RBC/hpf   WBC, UA >50 0 - 5 WBC/hpf   Bacteria, UA FEW (A) NONE SEEN   Squamous Epithelial / HPF 11-20 0 - 5 /HPF   WBC Clumps PRESENT    Mucus PRESENT    Hyaline Casts, UA PRESENT     Comment: Performed at [facility], [address] [place], [place] [address]  Urine Culture     Status: Abnormal   Collection Time: 05/17/23  8:54 AM   Specimen: Urine, Clean Catch  Result Value Ref Range   Specimen Description      URINE, CLEAN CATCH Performed at [facility], [address] [place], [place] [address]    Special Requests      NONE Performed at [facility], [address] [place], [place] [address]    Culture  >=100,000 COLONIES/mL ESCHERICHIA COLI (A)    Report Status 05/19/2023 FINAL    Organism ID, Bacteria ESCHERICHIA COLI (A)       Susceptibility   Escherichia coli - MIC*    AMPICILLIN >=32 RESISTANT Resistant     CEFAZOLIN  <=4 SENSITIVE Sensitive     CEFEPIME <=0.12 SENSITIVE Sensitive     CEFTRIAXONE  <=0.25 SENSITIVE Sensitive     CIPROFLOXACIN  >=4 RESISTANT Resistant     GENTAMICIN <=1 SENSITIVE Sensitive     IMIPENEM <=0.25 SENSITIVE Sensitive     NITROFURANTOIN  <=16 SENSITIVE Sensitive     TRIMETH /SULFA  >=320 RESISTANT Resistant     AMPICILLIN/SULBACTAM 16 INTERMEDIATE Intermediate     PIP/TAZO <=4 SENSITIVE Sensitive ug/mL    * >=100,000 COLONIES/mL ESCHERICHIA COLI  POCT Urinalysis Dipstick (18997)     Status: None   Collection Time: 06/05/23 11:11 AM  Result Value Ref Range  Color, UA yellow    Clarity, UA clear    Glucose, UA Negative Negative   Bilirubin, UA neg    Ketones, UA neg    Spec Grav, UA 1.015 1.010 - 1.025   Blood, UA trace    pH, UA 7.0 5.0 - 8.0   Protein, UA Negative Negative   Urobilinogen, UA 0.2 0.2 or 1.0 E.U./dL   Nitrite, UA neg    Leukocytes, UA Negative Negative   Appearance clear    Odor no       Assessment & Plan:  Patient advised to continue his medications.  Labs today. Problem List Items Addressed This Visit     Essential hypertension, benign - Primary   Relevant Orders   CMP14+EGFR   Hyperlipidemia   Relevant Orders   Lipid Panel w/o Chol/HDL Ratio   GAD (generalized anxiety disorder)   Other Visit Diagnoses       Bladder pain       Relevant Orders   POCT Urinalysis Dipstick (18997) (Completed)     Prediabetes       Relevant Orders   Hemoglobin A1c       Return in about 6 weeks (around 07/17/2023).   Total time spent: 30 minutes  FERNAND FREDY RAMAN, MD  06/05/2023   This document may have been prepared by [facility] Voice Recognition software and as such may include unintentional dictation errors.

## 2023-06-06 ENCOUNTER — Other Ambulatory Visit: Payer: Medicare Other

## 2023-06-07 LAB — CMP14+EGFR
ALT: 17 [IU]/L (ref 0–44)
AST: 27 [IU]/L (ref 0–40)
Albumin: 4.2 g/dL (ref 3.8–4.8)
Alkaline Phosphatase: 97 [IU]/L (ref 44–121)
BUN/Creatinine Ratio: 14 (ref 10–24)
BUN: 13 mg/dL (ref 8–27)
Bilirubin Total: 0.5 mg/dL (ref 0.0–1.2)
CO2: 19 mmol/L — ABNORMAL LOW (ref 20–29)
Calcium: 9.3 mg/dL (ref 8.6–10.2)
Chloride: 93 mmol/L — ABNORMAL LOW (ref 96–106)
Creatinine, Ser: 0.93 mg/dL (ref 0.76–1.27)
Globulin, Total: 2.8 g/dL (ref 1.5–4.5)
Glucose: 84 mg/dL (ref 70–99)
Potassium: 4.6 mmol/L (ref 3.5–5.2)
Sodium: 129 mmol/L — ABNORMAL LOW (ref 134–144)
Total Protein: 7 g/dL (ref 6.0–8.5)
eGFR: 87 mL/min/{1.73_m2} (ref >59)

## 2023-06-07 LAB — HEMOGLOBIN A1C
Est. average glucose Bld gHb Est-mCnc: 103 mg/dL
Hgb A1c MFr Bld: 5.2 % (ref 4.8–5.6)

## 2023-06-07 LAB — LIPID PANEL W/O CHOL/HDL RATIO
Cholesterol, Total: 174 mg/dL (ref 100–199)
HDL: 79 mg/dL (ref >39)
LDL Chol Calc (NIH): 82 mg/dL (ref 0–99)
Triglycerides: 69 mg/dL (ref 0–149)
VLDL Cholesterol Cal: 13 mg/dL (ref 5–40)

## 2023-06-08 ENCOUNTER — Ambulatory Visit: Payer: Medicare Other | Attending: Physician Assistant

## 2023-06-08 DIAGNOSIS — M6281 Muscle weakness (generalized): Secondary | ICD-10-CM | POA: Diagnosis present

## 2023-06-08 DIAGNOSIS — M79604 Pain in right leg: Secondary | ICD-10-CM | POA: Insufficient documentation

## 2023-06-08 DIAGNOSIS — M5459 Other low back pain: Secondary | ICD-10-CM | POA: Diagnosis present

## 2023-06-08 DIAGNOSIS — R2681 Unsteadiness on feet: Secondary | ICD-10-CM | POA: Insufficient documentation

## 2023-06-08 DIAGNOSIS — M79605 Pain in left leg: Secondary | ICD-10-CM | POA: Diagnosis present

## 2023-06-08 DIAGNOSIS — R262 Difficulty in walking, not elsewhere classified: Secondary | ICD-10-CM | POA: Diagnosis present

## 2023-06-08 NOTE — Therapy (Signed)
OUTPATIENT PHYSICAL THERAPY NEURO EVALUATION   Patient Name: Vincent Cole MRN: [id number] DOB:1951/01/19, 73 y.o., male Today's Date: 06/08/2023   PCP: Margaretann Loveless, MD  REFERRING PROVIDER: Bridgette Habermann, PA-C   END OF SESSION:  PT End of Session - 06/08/23 1154     Visit Number 1    Number of Visits 25    Date for PT Re-Evaluation 08/31/23    PT Start Time 1106    PT Stop Time 1146    PT Time Calculation (min) 40 min    Equipment Utilized During Treatment Gait belt    Activity Tolerance Patient tolerated treatment well    Behavior During Therapy [facility] for tasks assessed/performed             Past Medical History:  Diagnosis Date   Abdominal pain 01/17/2011   Formatting of this note might be different from the original.  Note: Unchanged - chronic  suprapubic/lower abdominal area since TURP in 2005; urology has now diagnosed, after extensive evaluation, interstitial cystitis; has also had repeat TURP in June 2010, and a surgery on the neck of the bladder in July of 2011     Actinic keratosis    Arthritis    Benign bladder mass    Complete tear of left rotator cuff 04/19/2017   Complication of anesthesia    PONV x 1 in 2010   Depression    Frequency of urination    GERD (gastroesophageal reflux disease) no meds   Heart murmur    was told many years ago   History of DVT of lower extremity 02/03/2020   History of recurrent UTIs    Hypertension    IC (interstitial cystitis)    Nocturia    OSA on CPAP nightly   Pelvic pain in male    PONV (postoperative nausea and vomiting)    Simple renal cyst    Urgency of urination    Urinary hesitancy    Past Surgical History:  Procedure Laterality Date   BUNIONECTOMY Right 01/15/2021   Procedure: Flavia Shipper;  Surgeon: Gwyneth Revels, DPM;  Location: [facility];  Service: Podiatry;  Laterality: Right;   COLONOSCOPY     COLONOSCOPY WITH PROPOFOL N/A 06/25/2021   Procedure: COLONOSCOPY WITH PROPOFOL;  Surgeon:  Regis Bill, MD;  Location: [facility];  Service: Endoscopy;  Laterality: N/A;   CORRECTION OVERLAPPING TOES Right 01/15/2021   Procedure: CORRECTION OVERLAPPING TOES;  Surgeon: Gwyneth Revels, DPM;  Location: [facility];  Service: Podiatry;  Laterality: Right;   CYSTO WITH HYDRODISTENSION  06/02/2011   Procedure: CYSTOSCOPY/HYDRODISTENSION;  Surgeon: Martina Sinner, MD;  Location: [facility];  Service: Urology;  Laterality: N/A;  catheter placement   HAMMER TOE SURGERY Right 01/15/2021   Procedure: HAMMER TOE CORRECTION;  Surgeon: Gwyneth Revels, DPM;  Location: [facility];  Service: Podiatry;  Laterality: Right;   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   JOINT REPLACEMENT     shoulder, left knee   KNEE ARTHROSCOPY     TRANSURETHRAL RESECTION OF PROSTATE  X3   LAST ONE MAY 2012   WEIL OSTEOTOMY Right 01/15/2021   Procedure: WEIL;  Surgeon: Gwyneth Revels, DPM;  Location: [facility];  Service: Podiatry;  Laterality: Right;   Patient Active Problem List   Diagnosis Date Noted   GAD (generalized anxiety disorder) 06/05/2023   Low back pain 08/04/2022   Gastroesophageal reflux disease without esophagitis 07/22/2022   Leg pain 06/19/2022   BMI 36.0-36.9,adult 11/17/2020  Bilateral lower extremity edema 02/03/2020   DDD (degenerative disc disease), lumbosacral 06/22/2018   Lumbar spondylosis 06/22/2018   Pain in limb 10/20/2017   Arthritis 08/02/2017   Insomnia 08/02/2017   OSA (obstructive sleep apnea) 08/02/2017   BNC (bladder neck contracture) 07/31/2017   Frontal lobe deficit 11/22/2016   Essential hypertension, benign 05/21/2015   Arthritis of left knee 05/21/2015   Interstitial cystitis 07/04/2011   Disorder of skin and subcutaneous tissue 04/04/2011   Hyperlipidemia 04/04/2011   Benign prostatic hyperplasia with urinary obstruction 01/17/2011   Scoliosis 01/17/2011    ONSET DATE: 2022  REFERRING DIAG:  Diagnosis  R26.89 (ICD-10-CM) -  Imbalance  M79.604 (ICD-10-CM) - Right leg pain  Z86.718 (ICD-10-CM) - History of DVT of lower extremity    THERAPY DIAG:  Muscle weakness (generalized)  Unsteadiness on feet  Difficulty in walking, not elsewhere classified  Other low back pain  Pain in right leg  Pain in left leg  Rationale for Evaluation and Treatment: Rehabilitation  SUBJECTIVE:                                                                                                                                                                                             SUBJECTIVE STATEMENT: The patient is a pleasant 73 y/o male presenting to PT for imbalance and chronic BLE pain (R>L) that worsened following DVT in 2022. Pt reports worsening balance over three years. Pt also with extensive surgical hx (see chart), but notes pain with walking since knee replacements: R knee replacement in May 2024, L partial knee replacement in 2016. Since R knee replacement LLE has started to hurt again. Pt concerned about LE weakness as well as balance. He feels as if he is walking on "cushions" most of the time, has difficulty with turns, reports hx of scoliosis that also affects posture/gait. Pt has pins and numbness feeling in bilateral feet, reports this started following bunion and hammer toe surgeries. Pt reports occ numbness in bilateral hands while sleeping. He reports 1 fall in the last 6 months that occurred when trying to reach something while in the bath tub. He reports no head injury. Has had a few near-falls as well, usually while walking. He reports no dizziness. He reports occ neck pain, he has chronic LBP and has upcoming nerve block for it. Pt also with hx of L shoulder replacement and upcoming R shoulder replacements this March.  Pt accompanied by: self  PERTINENT HISTORY: Per chart PMH significant for arthritis, benign bladder mass, complete tear of L RTC, depression, AD, heart murmur, LE DVT (2021 per chart, pt reports  this  was in 2022), hx of recurrent UTIs, HTN, OSA on CPAP, pelvic pain, BLE edema, DDD, lumbar spondylosis, scoliosis, LBP. Please refer to chart for full details  PAIN:  Are you having pain? Yes: NPRS scale: not rated, but pain present Pain location: chronic LBP, bilat LE pain Pain description: pins in feet Aggravating factors:   Relieving factors:    PRECAUTIONS: Fall  RED FLAGS: None   WEIGHT BEARING RESTRICTIONS: No  FALLS: Has patient fallen in last 6 months? Yes. Number of falls 1  LIVING ENVIRONMENT: Lives with: lives alone Lives in: House/apartment Stairs: Noground floor Has following equipment at home: Single point cane, Environmental consultant - 2 wheeled, and Grab bars  PLOF: Independent  PATIENT GOALS: I would like to be able to walk without the fear of falling.  OBJECTIVE:  Note: Objective measures were completed at Evaluation unless otherwise noted.  DIAGNOSTIC FINDINGS:  05/10/2023 MR BRAIN "IMPRESSION: 1. No acute intracranial abnormality or mass. 2. Unchanged mild frontal predominant volume loss and chronic small-vessel disease.     Electronically Signed   By: Orvan Falconer M.D.   On: 05/26/2023 09:07"  COGNITION: Overall cognitive status: Within functional limits for tasks assessed   SENSATION: WFL to light touch, does report pins and needles in feet  COORDINATION: WFL BLE  EDEMA:  Reports some RLE ankle swelling by the end of the day (reports this is a chronic issue)  POSTURE: weight shift right, has scoliosis    LOWER EXTREMITY MMT:   Grossly 4+/5 BLE, greatest weakness in hip flexors hip mm   TRANSFERS: Assistive device utilized: None  Sit to stand: Complete Independence Stand to sit: Complete Independence Chair to chair: Complete Independence GAIT: Gait pattern:  L hip drop, significant weight shift to R in mid-stance Distance walked: , clinic distances Assistive device utilized: None Level of assistance: SBA   FUNCTIONAL TESTS:  5  times sit to stand: 11.37sec hands-free  - reports some pain in low back with testing : 1.03 m/s : deferred DGI: deferred Screen of gait with scanning (horizontal head turn) mild impairment/increased variability in BOS SLB: able to sustain LLE, unable to hold 1-2 sec RLE   PATIENT SURVEYS:  LEFS 35 FOTO 46 (goal score 52)                                                                                                                              TREATMENT DATE: 06/08/23 EVAL ONLY     PATIENT EDUCATION: Education details: assessment findings, goals, plan Person educated: Patient Education method: Explanation Education comprehension: verbalized understanding  HOME EXERCISE PROGRAM: To be initiated next 1-2 visits   GOALS: ASSESSMENT: Reviewed with patient: yes  SHORT TERM GOALS: Target date: 07/20/2023    Patient will be independent in home exercise program to improve strength/mobility for better functional independence with ADLs. Baseline: Goal status: INITIAL   LONG TERM GOALS: Target date: 08/31/2023    Patient will increase FOTO score  to equal to or greater than   52  to demonstrate statistically significant improvement in mobility and quality of life.  Baseline: 46 Goal status: INITIAL  2.  Patient will complete five times sit to stand test in < 10 seconds indicating an increased LE strength and improved balance. Baseline: 11.37 sec  Goal status: INITIAL  3.  Patient will increase Berg Balance score by > 6 points to demonstrate decreased fall risk during functional activities Baseline:  Goal status: INITIAL  4.  Patient will increase 10 meter walk test to >1.73m/s as to improve gait speed for better community ambulation. Baseline: 1.03 m/s Goal status: INITIAL   5.  Patient will increase dynamic gait index score to >19/24 as to demonstrate reduced fall risk and improved dynamic gait balance for better safety with community/home ambulation.    Baseline:  Goal status: INITIAL  6. Patient will increase lower extremity functional scale to >60/80 to demonstrate improved functional mobility and increased tolerance with ADLs.   Baseline: 35  Goal status: INITIAL   CLINICAL IMPRESSION: Patient is a pleasant 73 y.o. male who was seen today for physical therapy evaluation and treatment for imbalance and RLE pain. Exam indicates impairments of pain, sensation, LE strength, balance, gait, and LE disability per LEFS score. The pt will benefit from further skilled PT to improve these impairments in order to increase QOL, ease and safety with ADLS and to decrease fall risk.   OBJECTIVE IMPAIRMENTS: Abnormal gait, decreased activity tolerance, decreased balance, decreased mobility, difficulty walking, decreased strength, increased edema, improper body mechanics, postural dysfunction, and pain.   ACTIVITY LIMITATIONS: lifting, bending, sitting, standing, squatting, stairs, and locomotion level  PARTICIPATION LIMITATIONS: cleaning, laundry, shopping, community activity, and yard work  PERSONAL FACTORS: Age, Fitness, Past/current experiences, Time since onset of injury/illness/exacerbation, and 3+ comorbidities: Per chart PMH significant for arthritis, benign bladder mass, complete tear of L RTC, depression, AD, heart murmur, LE DVT (2021 per chart, pt reports this was in 2022), hx of recurrent UTIs, HTN, OSA on CPAP, pelvic pain, BLE edema, DDD, lumbar spondylosis, scoliosis, LBP.  are also affecting patient's functional outcome.   REHAB POTENTIAL: Good  CLINICAL DECISION MAKING: Evolving/moderate complexity  EVALUATION COMPLEXITY: Moderate  PLAN:  PT FREQUENCY: 1-2x/week  PT DURATION: 12 weeks  PLANNED INTERVENTIONS: [id number]- PT Re-evaluation, 97110-Therapeutic exercises, 97530- Therapeutic activity, [id number]- Neuromuscular re-education, 97535- Self Care, [id number]- Manual therapy, [id number]- Gait training, [phone]- Orthotic Fit/training, [phone]-  Canalith repositioning, [id number]- Splinting, [id number]- Electrical stimulation (manual), [id number]- Ultrasound, Patient/Family education, Balance training, Stair training, Taping, Dry Needling, Joint mobilization, Spinal mobilization, Scar mobilization, Vestibular training, Visual/preceptual remediation/compensation, DME instructions, Cryotherapy, and Moist heat  PLAN FOR NEXT SESSION: complete further testing: , DGI; initiate HEP   Baird Kay, PT 06/08/2023, 12:25 PM

## 2023-06-12 ENCOUNTER — Ambulatory Visit: Payer: Medicare Other | Admitting: Physical Therapy

## 2023-06-12 DIAGNOSIS — R262 Difficulty in walking, not elsewhere classified: Secondary | ICD-10-CM

## 2023-06-12 DIAGNOSIS — M6281 Muscle weakness (generalized): Secondary | ICD-10-CM

## 2023-06-12 DIAGNOSIS — R2681 Unsteadiness on feet: Secondary | ICD-10-CM

## 2023-06-12 NOTE — Therapy (Signed)
OUTPATIENT PHYSICAL THERAPY NEURO EVALUATION   Patient Name: Vincent Cole MRN: [id number] DOB:1951/05/18, 73 y.o., male Today's Date: 06/12/2023   PCP: Margaretann Loveless, MD  REFERRING PROVIDER: Bridgette Habermann, PA-C   END OF SESSION:  PT End of Session - 06/12/23 1139     Visit Number 2    Number of Visits 25    Date for PT Re-Evaluation 08/31/23    PT Start Time 1145    PT Stop Time 1227    PT Time Calculation (min) 42 min    Equipment Utilized During Treatment Gait belt    Activity Tolerance Patient tolerated treatment well    Behavior During Therapy [facility] for tasks assessed/performed             Past Medical History:  Diagnosis Date   Abdominal pain 01/17/2011   Formatting of this note might be different from the original.  Note: Unchanged - chronic  suprapubic/lower abdominal area since TURP in 2005; urology has now diagnosed, after extensive evaluation, interstitial cystitis; has also had repeat TURP in June 2010, and a surgery on the neck of the bladder in July of 2011     Actinic keratosis    Arthritis    Benign bladder mass    Complete tear of left rotator cuff 04/19/2017   Complication of anesthesia    PONV x 1 in 2010   Depression    Frequency of urination    GERD (gastroesophageal reflux disease) no meds   Heart murmur    was told many years ago   History of DVT of lower extremity 02/03/2020   History of recurrent UTIs    Hypertension    IC (interstitial cystitis)    Nocturia    OSA on CPAP nightly   Pelvic pain in male    PONV (postoperative nausea and vomiting)    Simple renal cyst    Urgency of urination    Urinary hesitancy    Past Surgical History:  Procedure Laterality Date   BUNIONECTOMY Right 01/15/2021   Procedure: Flavia Shipper;  Surgeon: Gwyneth Revels, DPM;  Location: [facility];  Service: Podiatry;  Laterality: Right;   COLONOSCOPY     COLONOSCOPY WITH PROPOFOL N/A 06/25/2021   Procedure: COLONOSCOPY WITH PROPOFOL;  Surgeon:  Regis Bill, MD;  Location: [facility];  Service: Endoscopy;  Laterality: N/A;   CORRECTION OVERLAPPING TOES Right 01/15/2021   Procedure: CORRECTION OVERLAPPING TOES;  Surgeon: Gwyneth Revels, DPM;  Location: [facility];  Service: Podiatry;  Laterality: Right;   CYSTO WITH HYDRODISTENSION  06/02/2011   Procedure: CYSTOSCOPY/HYDRODISTENSION;  Surgeon: Martina Sinner, MD;  Location: [facility];  Service: Urology;  Laterality: N/A;  catheter placement   HAMMER TOE SURGERY Right 01/15/2021   Procedure: HAMMER TOE CORRECTION;  Surgeon: Gwyneth Revels, DPM;  Location: [facility];  Service: Podiatry;  Laterality: Right;   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   JOINT REPLACEMENT     shoulder, left knee   KNEE ARTHROSCOPY     TRANSURETHRAL RESECTION OF PROSTATE  X3   LAST ONE MAY 2012   WEIL OSTEOTOMY Right 01/15/2021   Procedure: WEIL;  Surgeon: Gwyneth Revels, DPM;  Location: [facility];  Service: Podiatry;  Laterality: Right;   Patient Active Problem List   Diagnosis Date Noted   GAD (generalized anxiety disorder) 06/05/2023   Low back pain 08/04/2022   Gastroesophageal reflux disease without esophagitis 07/22/2022   Leg pain 06/19/2022   BMI 36.0-36.9,adult 11/17/2020  Bilateral lower extremity edema 02/03/2020   DDD (degenerative disc disease), lumbosacral 06/22/2018   Lumbar spondylosis 06/22/2018   Pain in limb 10/20/2017   Arthritis 08/02/2017   Insomnia 08/02/2017   OSA (obstructive sleep apnea) 08/02/2017   BNC (bladder neck contracture) 07/31/2017   Frontal lobe deficit 11/22/2016   Essential hypertension, benign 05/21/2015   Arthritis of left knee 05/21/2015   Interstitial cystitis 07/04/2011   Disorder of skin and subcutaneous tissue 04/04/2011   Hyperlipidemia 04/04/2011   Benign prostatic hyperplasia with urinary obstruction 01/17/2011   Scoliosis 01/17/2011    ONSET DATE: 2022  REFERRING DIAG:  Diagnosis  R26.89 (ICD-10-CM) -  Imbalance  M79.604 (ICD-10-CM) - Right leg pain  Z86.718 (ICD-10-CM) - History of DVT of lower extremity    THERAPY DIAG:  Muscle weakness (generalized)  Unsteadiness on feet  Difficulty in walking, not elsewhere classified  Rationale for Evaluation and Treatment: Rehabilitation  SUBJECTIVE:                                                                                                                                                                                             SUBJECTIVE STATEMENT: {T reports no falls or LOB since last visit. Has been doing well with no falls.   From eval: The patient is a pleasant 73 y/o male presenting to PT for imbalance and chronic BLE pain (R>L) that worsened following DVT in 2022. Pt reports worsening balance over three years. Pt also with extensive surgical hx (see chart), but notes pain with walking since knee replacements: R knee replacement in May 2024, L partial knee replacement in 2016. Since R knee replacement LLE has started to hurt again. Pt concerned about LE weakness as well as balance. He feels as if he is walking on "cushions" most of the time, has difficulty with turns, reports hx of scoliosis that also affects posture/gait. Pt has pins and numbness feeling in bilateral feet, reports this started following bunion and hammer toe surgeries. Pt reports occ numbness in bilateral hands while sleeping. He reports 1 fall in the last 6 months that occurred when trying to reach something while in the bath tub. He reports no head injury. Has had a few near-falls as well, usually while walking. He reports no dizziness. He reports occ neck pain, he has chronic LBP and has upcoming nerve block for it. Pt also with hx of L shoulder replacement and upcoming R shoulder replacements this March.  Pt accompanied by: self  PERTINENT HISTORY: Per chart PMH significant for arthritis, benign bladder mass, complete tear of L RTC, depression, AD, heart murmur, LE  DVT (2021  per chart, pt reports this was in 2022), hx of recurrent UTIs, HTN, OSA on CPAP, pelvic pain, BLE edema, DDD, lumbar spondylosis, scoliosis, LBP. Please refer to chart for full details  PAIN:  Are you having pain? Yes: NPRS scale: not rated, but pain present Pain location: chronic LBP, bilat LE pain Pain description: pins in feet Aggravating factors:   Relieving factors:    PRECAUTIONS: Fall  RED FLAGS: None   WEIGHT BEARING RESTRICTIONS: No  FALLS: Has patient fallen in last 6 months? Yes. Number of falls 1  LIVING ENVIRONMENT: Lives with: lives alone Lives in: House/apartment Stairs: Noground floor Has following equipment at home: Single point cane, Environmental consultant - 2 wheeled, and Grab bars  PLOF: Independent  PATIENT GOALS: I would like to be able to walk without the fear of falling.  OBJECTIVE:  Note: Objective measures were completed at Evaluation unless otherwise noted.  DIAGNOSTIC FINDINGS:  05/10/2023 MR BRAIN "IMPRESSION: 1. No acute intracranial abnormality or mass. 2. Unchanged mild frontal predominant volume loss and chronic small-vessel disease.     Electronically Signed   By: Orvan Falconer M.D.   On: 05/26/2023 09:07"  COGNITION: Overall cognitive status: Within functional limits for tasks assessed   SENSATION: WFL to light touch, does report pins and needles in feet  COORDINATION: WFL BLE  EDEMA:  Reports some RLE ankle swelling by the end of the day (reports this is a chronic issue)  POSTURE: weight shift right, has scoliosis    LOWER EXTREMITY MMT:   Grossly 4+/5 BLE, greatest weakness in hip flexors hip mm   TRANSFERS: Assistive device utilized: None  Sit to stand: Complete Independence Stand to sit: Complete Independence Chair to chair: Complete Independence GAIT: Gait pattern:  L hip drop, significant weight shift to R in mid-stance Distance walked: , clinic distances Assistive device utilized: None Level of assistance:  SBA   FUNCTIONAL TESTS:  5 times sit to stand: 11.37sec hands-free  - reports some pain in low back with testing : 1.03 m/s : deferred DGI: deferred Screen of gait with scanning (horizontal head turn) mild impairment/increased variability in BOS SLB: able to sustain LLE, unable to hold 1-2 sec RLE   PATIENT SURVEYS:  LEFS 35 FOTO 46 (goal score 52)                                                                                                                              TREATMENT DATE: 06/12/23  [facility] PT Assessment - 06/12/23 0001       Standardized Balance Assessment   Standardized Balance Assessment Berg Balance Test;Dynamic Gait Index      Berg Balance Test   Sit to Stand Able to stand without using hands and stabilize independently    Standing Unsupported Able to stand safely 2 minutes    Sitting with Back Unsupported but Feet Supported on Floor or Stool Able to sit safely and securely 2 minutes  Stand to Sit Sits safely with minimal use of hands    Transfers Able to transfer safely, minor use of hands    Standing Unsupported with Eyes Closed Able to stand 10 seconds safely    Standing Unsupported with Feet Together Able to place feet together independently and stand 1 minute safely    From Standing, Reach Forward with Outstretched Arm Can reach forward >12 cm safely (5")    From Standing Position, Pick up Object from Floor Able to pick up shoe, needs supervision    From Standing Position, Turn to Look Behind Over each Shoulder Looks behind one side only/other side shows less weight shift    Turn 360 Degrees Able to turn 360 degrees safely but slowly    Standing Unsupported, Alternately Place Feet on Step/Stool Able to complete 4 steps without aid or supervision    Standing Unsupported, One Foot in Front Able to plae foot ahead of the other independently and hold 30 seconds    Standing on One Leg Able to lift leg independently and hold equal to or more than 3  seconds    Total Score 46      Dynamic Gait Index   Level Surface Mild Impairment    Change in Gait Speed Normal    Gait with Horizontal Head Turns Mild Impairment    Gait with Vertical Head Turns Mild Impairment    Gait and Pivot Turn Normal    Step Over Obstacle Moderate Impairment    Step Around Obstacles Mild Impairment    Steps Mild Impairment    Total Score 17             : 1090 ft  Instructed pt in the following HEP and completed same reps and sets as below  Access Code: [email or web address] URL: [email or web address] Date: 06/12/2023 Prepared by: Thresa Ross  Exercises - Standing Tandem Balance with Counter Support  - 1 x daily - 7 x weekly - 3 sets - 10 reps - Slow Standing Marching  - 1 x daily - 7 x weekly - 2 sets - 10 reps - Sit to Stand with Arms Crossed  - 1 x daily - 7 x weekly - 2 sets - 12 reps PATIENT EDUCATION: Education details: assessment findings, goals, plan Person educated: Patient Education method: Explanation Education comprehension: verbalized understanding  HOME EXERCISE PROGRAM: Access Code: [email or web address] URL: [email or web address] Date: 06/12/2023 Prepared by: Thresa Ross  Exercises - Standing Tandem Balance with Counter Support  - 1 x daily - 7 x weekly - 3 sets - 10 reps - Slow Standing Marching  - 1 x daily - 7 x weekly - 2 sets - 10 reps - Sit to Stand with Arms Crossed  - 1 x daily - 7 x weekly - 2 sets - 12 reps  GOALS: ASSESSMENT: Reviewed with patient: yes  SHORT TERM GOALS: Target date: 07/20/2023    Patient will be independent in home exercise program to improve strength/mobility for better functional independence with ADLs. Baseline: Goal status: INITIAL   LONG TERM GOALS: Target date: 08/31/2023    Patient will increase FOTO score to equal to or greater than   52  to demonstrate statistically significant improvement in mobility and quality of life.  Baseline: 46 Goal status:  INITIAL  2.  Patient will complete five times sit to stand test in < 10 seconds indicating an increased LE strength and improved balance. Baseline: 11.37 sec  Goal status: INITIAL  3.  Patient will increase Programmer, systems  score by > 6 points to demonstrate decreased fall risk during functional activities Baseline: 46/56 Goal status: INITIAL  4.  Patient will increase 10 meter walk test to >1.24m/s as to improve gait speed for better community ambulation. Baseline: 1.03 m/s Goal status: INITIAL   5.  Patient will increase dynamic gait index score to >19/24 as to demonstrate reduced fall risk and improved dynamic gait balance for better safety with community/home ambulation.   Baseline: 17 Goal status: INITIAL  6. Patient will increase lower extremity functional scale to >60/80 to demonstrate improved functional mobility and increased tolerance with ADLs.   Baseline: 35  Goal status: INITIAL   CLINICAL IMPRESSION: Patient arrived with good motivation form completion of pt activities.  PT continues with assessments form initial eval. Pt shows further deficits in balance AEB BERG and DGI test indicating increased risk of falls. Pt performs well with and is at community ambulation level. Pt provided with initial HEP and demonstrated good understanding and knowledge of the activities. Pt will continue to benefit from skilled physical therapy intervention to address impairments, improve QOL, and attain therapy goals.    OBJECTIVE IMPAIRMENTS: Abnormal gait, decreased activity tolerance, decreased balance, decreased mobility, difficulty walking, decreased strength, increased edema, improper body mechanics, postural dysfunction, and pain.   ACTIVITY LIMITATIONS: lifting, bending, sitting, standing, squatting, stairs, and locomotion level  PARTICIPATION LIMITATIONS: cleaning, laundry, shopping, community activity, and yard work  PERSONAL FACTORS: Age, Fitness, Past/current experiences,  Time since onset of injury/illness/exacerbation, and 3+ comorbidities: Per chart PMH significant for arthritis, benign bladder mass, complete tear of L RTC, depression, AD, heart murmur, LE DVT (2021 per chart, pt reports this was in 2022), hx of recurrent UTIs, HTN, OSA on CPAP, pelvic pain, BLE edema, DDD, lumbar spondylosis, scoliosis, LBP.  are also affecting patient's functional outcome.   REHAB POTENTIAL: Good  CLINICAL DECISION MAKING: Evolving/moderate complexity  EVALUATION COMPLEXITY: Moderate  PLAN:  PT FREQUENCY: 1-2x/week  PT DURATION: 12 weeks  PLANNED INTERVENTIONS: 97164- PT Re-evaluation, 97110-Therapeutic exercises, 97530- Therapeutic activity, [id number]- Neuromuscular re-education, 97535- Self Care, [id number]- Manual therapy, [id number]- Gait training, [phone]- Orthotic Fit/training, [phone]- Canalith repositioning, [id number]- Splinting, [id number]- Electrical stimulation (manual), [id number]- Ultrasound, Patient/Family education, Balance training, Stair training, Taping, Dry Needling, Joint mobilization, Spinal mobilization, Scar mobilization, Vestibular training, Visual/preceptual remediation/compensation, DME instructions, Cryotherapy, and Moist heat  PLAN FOR NEXT SESSION: Strength and balance interventions, more dynamic than static but static still indicated.    Norman Herrlich, PT 06/12/2023, 11:39 AM

## 2023-06-13 ENCOUNTER — Other Ambulatory Visit: Payer: Self-pay | Admitting: Internal Medicine

## 2023-06-13 DIAGNOSIS — F330 Major depressive disorder, recurrent, mild: Secondary | ICD-10-CM

## 2023-06-13 NOTE — Progress Notes (Signed)
Patient notified

## 2023-06-14 ENCOUNTER — Encounter: Payer: Medicare Other | Admitting: Dermatology

## 2023-06-15 ENCOUNTER — Ambulatory Visit: Payer: Medicare Other | Admitting: Physical Therapy

## 2023-06-15 DIAGNOSIS — M6281 Muscle weakness (generalized): Secondary | ICD-10-CM

## 2023-06-15 DIAGNOSIS — R2681 Unsteadiness on feet: Secondary | ICD-10-CM

## 2023-06-15 DIAGNOSIS — R262 Difficulty in walking, not elsewhere classified: Secondary | ICD-10-CM

## 2023-06-15 DIAGNOSIS — M5459 Other low back pain: Secondary | ICD-10-CM

## 2023-06-15 NOTE — Therapy (Addendum)
OUTPATIENT PHYSICAL THERAPY NEURO TREATMENT   Patient Name: Vincent Cole MRN: [id number] DOB:[birth year], 73 y.o., male Today's Date: 06/19/2023   PCP: Margaretann Loveless, MD  REFERRING PROVIDER: Bridgette Habermann, PA-C   END OF SESSION:  Visit Number 3   Number of Visits 25    Date for PT Re-Evaluation 08/31/23    PT Start Time 1315   PT Stop Time 1358   PT Time Calculation (min) 42 min    Equipment Utilized During Treatment Gait belt    Activity Tolerance Patient tolerated treatment well    Behavior During Therapy [facility] for tasks assessed/performed      Past Medical History:  Diagnosis Date   Abdominal pain 01/17/2011   Formatting of this note might be different from the original.  Note: Unchanged - chronic  suprapubic/lower abdominal area since TURP in 2005; urology has now diagnosed, after extensive evaluation, interstitial cystitis; has also had repeat TURP in June 2010, and a surgery on the neck of the bladder in July of 2011     Actinic keratosis    Arthritis    Benign bladder mass    Complete tear of left rotator cuff 04/19/2017   Complication of anesthesia    PONV x 1 in 2010   Depression    Frequency of urination    GERD (gastroesophageal reflux disease) no meds   Heart murmur    was told many years ago   History of DVT of lower extremity 02/03/2020   History of recurrent UTIs    Hypertension    IC (interstitial cystitis)    Nocturia    OSA on CPAP nightly   Pelvic pain in male    PONV (postoperative nausea and vomiting)    Simple renal cyst    Urgency of urination    Urinary hesitancy    Past Surgical History:  Procedure Laterality Date   BUNIONECTOMY Right 01/15/2021   Procedure: Flavia Shipper;  Surgeon: Gwyneth Revels, DPM;  Location: [facility];  Service: Podiatry;  Laterality: Right;   COLONOSCOPY     COLONOSCOPY WITH PROPOFOL N/A 06/25/2021   Procedure: COLONOSCOPY WITH PROPOFOL;  Surgeon: Regis Bill, MD;  Location: [facility];   Service: Endoscopy;  Laterality: N/A;   CORRECTION OVERLAPPING TOES Right 01/15/2021   Procedure: CORRECTION OVERLAPPING TOES;  Surgeon: Gwyneth Revels, DPM;  Location: [facility];  Service: Podiatry;  Laterality: Right;   CYSTO WITH HYDRODISTENSION  06/02/2011   Procedure: CYSTOSCOPY/HYDRODISTENSION;  Surgeon: Martina Sinner, MD;  Location: [facility];  Service: Urology;  Laterality: N/A;  catheter placement   HAMMER TOE SURGERY Right 01/15/2021   Procedure: HAMMER TOE CORRECTION;  Surgeon: Gwyneth Revels, DPM;  Location: [facility];  Service: Podiatry;  Laterality: Right;   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   JOINT REPLACEMENT     shoulder, left knee   KNEE ARTHROSCOPY     TRANSURETHRAL RESECTION OF PROSTATE  X3   LAST ONE MAY 2012   WEIL OSTEOTOMY Right 01/15/2021   Procedure: WEIL;  Surgeon: Gwyneth Revels, DPM;  Location: [facility];  Service: Podiatry;  Laterality: Right;   Patient Active Problem List   Diagnosis Date Noted   GAD (generalized anxiety disorder) 06/05/2023   Low back pain 08/04/2022   Gastroesophageal reflux disease without esophagitis 07/22/2022   Leg pain 06/19/2022   BMI 36.0-36.9,adult 11/17/2020   Bilateral lower extremity edema 02/03/2020   DDD (degenerative disc disease), lumbosacral 06/22/2018   Lumbar spondylosis 06/22/2018  Pain in limb 10/20/2017   Arthritis 08/02/2017   Insomnia 08/02/2017   OSA (obstructive sleep apnea) 08/02/2017   BNC (bladder neck contracture) 07/31/2017   Frontal lobe deficit 11/22/2016   Essential hypertension, benign 05/21/2015   Arthritis of left knee 05/21/2015   Interstitial cystitis 07/04/2011   Disorder of skin and subcutaneous tissue 04/04/2011   Hyperlipidemia 04/04/2011   Benign prostatic hyperplasia with urinary obstruction 01/17/2011   Scoliosis 01/17/2011    ONSET DATE: 2022  REFERRING DIAG:  Diagnosis  R26.89 (ICD-10-CM) - Imbalance  M79.604 (ICD-10-CM) - Right leg pain  Z86.718  (ICD-10-CM) - History of DVT of lower extremity    THERAPY DIAG:  Muscle weakness (generalized)  Unsteadiness on feet  Difficulty in walking, not elsewhere classified  Other low back pain  Rationale for Evaluation and Treatment: Rehabilitation  SUBJECTIVE:                                                                                                                                                                                             SUBJECTIVE STATEMENT: Pt reports no falls or LOB since last visit. Has been doing well with no falls.   From eval: The patient is a pleasant 73 y/o male presenting to PT for imbalance and chronic BLE pain (R>L) that worsened following DVT in 2022. Pt reports worsening balance over three years. Pt also with extensive surgical hx (see chart), but notes pain with walking since knee replacements: R knee replacement in May 2024, L partial knee replacement in 2016. Since R knee replacement LLE has started to hurt again. Pt concerned about LE weakness as well as balance. He feels as if he is walking on "cushions" most of the time, has difficulty with turns, reports hx of scoliosis that also affects posture/gait. Pt has pins and numbness feeling in bilateral feet, reports this started following bunion and hammer toe surgeries. Pt reports occ numbness in bilateral hands while sleeping. He reports 1 fall in the last 6 months that occurred when trying to reach something while in the bath tub. He reports no head injury. Has had a few near-falls as well, usually while walking. He reports no dizziness. He reports occ neck pain, he has chronic LBP and has upcoming nerve block for it. Pt also with hx of L shoulder replacement and upcoming R shoulder replacements this March.  Pt accompanied by: self  PERTINENT HISTORY: Per chart PMH significant for arthritis, benign bladder mass, complete tear of L RTC, depression, AD, heart murmur, LE DVT (2021 per chart, pt reports this  was in 2022), hx of recurrent UTIs, HTN, OSA on  CPAP, pelvic pain, BLE edema, DDD, lumbar spondylosis, scoliosis, LBP. Please refer to chart for full details  PAIN:  Are you having pain? Yes: NPRS scale: not rated, but pain present Pain location: chronic LBP, bilat LE pain Pain description: pins in feet Aggravating factors:   Relieving factors:    PRECAUTIONS: Fall  RED FLAGS: None   WEIGHT BEARING RESTRICTIONS: No  FALLS: Has patient fallen in last 6 months? Yes. Number of falls 1  LIVING ENVIRONMENT: Lives with: lives alone Lives in: House/apartment Stairs: Noground floor Has following equipment at home: Single point cane, Environmental consultant - 2 wheeled, and Grab bars  PLOF: Independent  PATIENT GOALS: I would like to be able to walk without the fear of falling.  OBJECTIVE:  Note: Objective measures were completed at Evaluation unless otherwise noted.  DIAGNOSTIC FINDINGS:  05/10/2023 MR BRAIN "IMPRESSION: 1. No acute intracranial abnormality or mass. 2. Unchanged mild frontal predominant volume loss and chronic small-vessel disease.     Electronically Signed   By: Orvan Falconer M.D.   On: 05/26/2023 09:07"  COGNITION: Overall cognitive status: Within functional limits for tasks assessed   SENSATION: WFL to light touch, does report pins and needles in feet  COORDINATION: WFL BLE  EDEMA:  Reports some RLE ankle swelling by the end of the day (reports this is a chronic issue)  POSTURE: weight shift right, has scoliosis    LOWER EXTREMITY MMT:   Grossly 4+/5 BLE, greatest weakness in hip flexors hip mm   TRANSFERS: Assistive device utilized: None  Sit to stand: Complete Independence Stand to sit: Complete Independence Chair to chair: Complete Independence GAIT: Gait pattern:  L hip drop, significant weight shift to R in mid-stance Distance walked: , clinic distances Assistive device utilized: None Level of assistance: SBA   FUNCTIONAL TESTS:  5 times  sit to stand: 11.37sec hands-free  - reports some pain in low back with testing : 1.03 m/s : deferred DGI: deferred Screen of gait with scanning (horizontal head turn) mild impairment/increased variability in BOS SLB: able to sustain LLE, unable to hold 1-2 sec RLE   PATIENT SURVEYS:  LEFS 35 FOTO 46 (goal score 52)                                                                                                                              TREATMENT DATE: 06/19/23 NMR  Step overs hurdles with intermittent UE support 2 x 10 ea LE   Step taps with 3# AW 2 x 10 ea LE   Kore balance trainer working on patients proprioception and standing balance on dynamic surface  Setting type(s): pop dot and maze challenges  Reps: 3,1 respectively  Comments: Pt challenged with post and and control at end of BOS.    Ambulation x 25 ft forward then retro gait x 20 ft x 3 rounds   TE Standing side step with band around ankles ( RTB 2*10 ea side)  Standing heel to toe rock 2 x 10 with UE support  Mini lunges 2 x 10 ea LE with UE support    PATIENT EDUCATION: Education details: assessment findings, goals, plan Person educated: Patient Education method: Explanation Education comprehension: verbalized understanding  HOME EXERCISE PROGRAM: Access Code: [email or web address] URL: [email or web address] Date: 06/12/2023 Prepared by: Thresa Ross  Exercises - Standing Tandem Balance with Counter Support  - 1 x daily - 7 x weekly - 3 sets - 10 reps - Slow Standing Marching  - 1 x daily - 7 x weekly - 2 sets - 10 reps - Sit to Stand with Arms Crossed  - 1 x daily - 7 x weekly - 2 sets - 12 reps  GOALS: ASSESSMENT: Reviewed with patient: yes  SHORT TERM GOALS: Target date: 07/20/2023    Patient will be independent in home exercise program to improve strength/mobility for better functional independence with ADLs. Baseline: Goal status: INITIAL   LONG TERM GOALS: Target  date: 08/31/2023    Patient will increase FOTO score to equal to or greater than   52  to demonstrate statistically significant improvement in mobility and quality of life.  Baseline: 46 Goal status: INITIAL  2.  Patient will complete five times sit to stand test in < 10 seconds indicating an increased LE strength and improved balance. Baseline: 11.37 sec  Goal status: INITIAL  3.  Patient will increase Berg Balance score by > 6 points to demonstrate decreased fall risk during functional activities Baseline: 46/56 Goal status: INITIAL  4.  Patient will increase 10 meter walk test to >1.110m/s as to improve gait speed for better community ambulation. Baseline: 1.03 m/s Goal status: INITIAL   5.  Patient will increase dynamic gait index score to >19/24 as to demonstrate reduced fall risk and improved dynamic gait balance for better safety with community/home ambulation.   Baseline: 17 Goal status: INITIAL  6. Patient will increase lower extremity functional scale to >60/80 to demonstrate improved functional mobility and increased tolerance with ADLs.   Baseline: 35  Goal status: INITIAL   CLINICAL IMPRESSION: Patient arrived with good motivation form completion of pt activities.  Pt begins with high level dynamic balance interventions. Pt has difficulty with foot clearance with object step overs as well as challenged with dual task balance using the KORE balance machine. Pt reports good compliance with HEP from initial session. Pt will continue to benefit from skilled physical therapy intervention to address impairments, improve QOL, and attain therapy goals.     OBJECTIVE IMPAIRMENTS: Abnormal gait, decreased activity tolerance, decreased balance, decreased mobility, difficulty walking, decreased strength, increased edema, improper body mechanics, postural dysfunction, and pain.   ACTIVITY LIMITATIONS: lifting, bending, sitting, standing, squatting, stairs, and locomotion  level  PARTICIPATION LIMITATIONS: cleaning, laundry, shopping, community activity, and yard work  PERSONAL FACTORS: Age, Fitness, Past/current experiences, Time since onset of injury/illness/exacerbation, and 3+ comorbidities: Per chart PMH significant for arthritis, benign bladder mass, complete tear of L RTC, depression, AD, heart murmur, LE DVT (2021 per chart, pt reports this was in 2022), hx of recurrent UTIs, HTN, OSA on CPAP, pelvic pain, BLE edema, DDD, lumbar spondylosis, scoliosis, LBP.  are also affecting patient's functional outcome.   REHAB POTENTIAL: Good  CLINICAL DECISION MAKING: Evolving/moderate complexity  EVALUATION COMPLEXITY: Moderate  PLAN:  PT FREQUENCY: 1-2x/week  PT DURATION: 12 weeks  PLANNED INTERVENTIONS: 97164- PT Re-evaluation, 97110-Therapeutic exercises, 97530- Therapeutic activity, [id number]- Neuromuscular re-education, 97535- Self Care, 54098- Manual therapy, [id number]- Gait training, [phone]-  Orthotic Fit/training, 78295- Canalith repositioning, 62130- Splinting, 86578- Electrical stimulation (manual), [id number]- Ultrasound, Patient/Family education, Balance training, Stair training, Taping, Dry Needling, Joint mobilization, Spinal mobilization, Scar mobilization, Vestibular training, Visual/preceptual remediation/compensation, DME instructions, Cryotherapy, and Moist heat  PLAN FOR NEXT SESSION: Strength and balance interventions, more dynamic than static but static still indicated.    Norman Herrlich, PT 06/19/2023, 2:07 PM

## 2023-06-19 ENCOUNTER — Ambulatory Visit: Payer: Medicare Other | Admitting: Physical Therapy

## 2023-06-19 ENCOUNTER — Other Ambulatory Visit: Payer: Medicare Other

## 2023-06-19 DIAGNOSIS — R2681 Unsteadiness on feet: Secondary | ICD-10-CM

## 2023-06-19 DIAGNOSIS — R262 Difficulty in walking, not elsewhere classified: Secondary | ICD-10-CM

## 2023-06-19 DIAGNOSIS — I10 Essential (primary) hypertension: Secondary | ICD-10-CM

## 2023-06-19 DIAGNOSIS — M6281 Muscle weakness (generalized): Secondary | ICD-10-CM | POA: Diagnosis not present

## 2023-06-19 NOTE — Therapy (Signed)
OUTPATIENT PHYSICAL THERAPY NEURO TREATMENT   Patient Name: Vincent Cole MRN: [id number] DOB:15-Nov-1950, 73 y.o., male Today's Date: 06/19/2023   PCP: Margaretann Loveless, MD  REFERRING PROVIDER: Bridgette Habermann, PA-C   END OF SESSION:  PT End of Session - 06/19/23 1408     Visit Number 4    Number of Visits 25    Date for PT Re-Evaluation 08/31/23    Progress Note Due on Visit 10    PT Start Time 1316    PT Stop Time 1357    PT Time Calculation (min) 41 min    Equipment Utilized During Treatment Gait belt    Activity Tolerance Patient tolerated treatment well    Behavior During Therapy [facility] for tasks assessed/performed              Past Medical History:  Diagnosis Date   Abdominal pain 01/17/2011   Formatting of this note might be different from the original.  Note: Unchanged - chronic  suprapubic/lower abdominal area since TURP in 2005; urology has now diagnosed, after extensive evaluation, interstitial cystitis; has also had repeat TURP in June 2010, and a surgery on the neck of the bladder in July of 2011     Actinic keratosis    Arthritis    Benign bladder mass    Complete tear of left rotator cuff 04/19/2017   Complication of anesthesia    PONV x 1 in 2010   Depression    Frequency of urination    GERD (gastroesophageal reflux disease) no meds   Heart murmur    was told many years ago   History of DVT of lower extremity 02/03/2020   History of recurrent UTIs    Hypertension    IC (interstitial cystitis)    Nocturia    OSA on CPAP nightly   Pelvic pain in male    PONV (postoperative nausea and vomiting)    Simple renal cyst    Urgency of urination    Urinary hesitancy    Past Surgical History:  Procedure Laterality Date   BUNIONECTOMY Right 01/15/2021   Procedure: Flavia Shipper;  Surgeon: Gwyneth Revels, DPM;  Location: [facility];  Service: Podiatry;  Laterality: Right;   COLONOSCOPY     COLONOSCOPY WITH PROPOFOL N/A 06/25/2021   Procedure:  COLONOSCOPY WITH PROPOFOL;  Surgeon: Regis Bill, MD;  Location: [facility];  Service: Endoscopy;  Laterality: N/A;   CORRECTION OVERLAPPING TOES Right 01/15/2021   Procedure: CORRECTION OVERLAPPING TOES;  Surgeon: Gwyneth Revels, DPM;  Location: [facility];  Service: Podiatry;  Laterality: Right;   CYSTO WITH HYDRODISTENSION  06/02/2011   Procedure: CYSTOSCOPY/HYDRODISTENSION;  Surgeon: Martina Sinner, MD;  Location: [facility];  Service: Urology;  Laterality: N/A;  catheter placement   HAMMER TOE SURGERY Right 01/15/2021   Procedure: HAMMER TOE CORRECTION;  Surgeon: Gwyneth Revels, DPM;  Location: [facility];  Service: Podiatry;  Laterality: Right;   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   JOINT REPLACEMENT     shoulder, left knee   KNEE ARTHROSCOPY     TRANSURETHRAL RESECTION OF PROSTATE  X3   LAST ONE MAY 2012   WEIL OSTEOTOMY Right 01/15/2021   Procedure: WEIL;  Surgeon: Gwyneth Revels, DPM;  Location: [facility];  Service: Podiatry;  Laterality: Right;   Patient Active Problem List   Diagnosis Date Noted   GAD (generalized anxiety disorder) 06/05/2023   Low back pain 08/04/2022   Gastroesophageal reflux disease without esophagitis 07/22/2022  Leg pain 06/19/2022   BMI 36.0-36.9,adult 11/17/2020   Bilateral lower extremity edema 02/03/2020   DDD (degenerative disc disease), lumbosacral 06/22/2018   Lumbar spondylosis 06/22/2018   Pain in limb 10/20/2017   Arthritis 08/02/2017   Insomnia 08/02/2017   OSA (obstructive sleep apnea) 08/02/2017   BNC (bladder neck contracture) 07/31/2017   Frontal lobe deficit 11/22/2016   Essential hypertension, benign 05/21/2015   Arthritis of left knee 05/21/2015   Interstitial cystitis 07/04/2011   Disorder of skin and subcutaneous tissue 04/04/2011   Hyperlipidemia 04/04/2011   Benign prostatic hyperplasia with urinary obstruction 01/17/2011   Scoliosis 01/17/2011    ONSET DATE: 2022  REFERRING DIAG:   Diagnosis  R26.89 (ICD-10-CM) - Imbalance  M79.604 (ICD-10-CM) - Right leg pain  Z86.718 (ICD-10-CM) - History of DVT of lower extremity    THERAPY DIAG:  Muscle weakness (generalized)  Unsteadiness on feet  Difficulty in walking, not elsewhere classified  Rationale for Evaluation and Treatment: Rehabilitation  SUBJECTIVE:                                                                                                                                                                                             SUBJECTIVE STATEMENT: Pt reports no falls or LOB since last visit. He has feelings of heaviness in his legs this date which he thinks may be related to his back.   From eval: The patient is a pleasant 73 y/o male presenting to PT for imbalance and chronic BLE pain (R>L) that worsened following DVT in 2022. Pt reports worsening balance over three years. Pt also with extensive surgical hx (see chart), but notes pain with walking since knee replacements: R knee replacement in May 2024, L partial knee replacement in 2016. Since R knee replacement LLE has started to hurt again. Pt concerned about LE weakness as well as balance. He feels as if he is walking on "cushions" most of the time, has difficulty with turns, reports hx of scoliosis that also affects posture/gait. Pt has pins and numbness feeling in bilateral feet, reports this started following bunion and hammer toe surgeries. Pt reports occ numbness in bilateral hands while sleeping. He reports 1 fall in the last 6 months that occurred when trying to reach something while in the bath tub. He reports no head injury. Has had a few near-falls as well, usually while walking. He reports no dizziness. He reports occ neck pain, he has chronic LBP and has upcoming nerve block for it. Pt also with hx of L shoulder replacement and upcoming R shoulder replacements this March.  Pt accompanied by: self  PERTINENT HISTORY:  Per chart PMH significant  for arthritis, benign bladder mass, complete tear of L RTC, depression, AD, heart murmur, LE DVT (2021 per chart, pt reports this was in 2022), hx of recurrent UTIs, HTN, OSA on CPAP, pelvic pain, BLE edema, DDD, lumbar spondylosis, scoliosis, LBP. Please refer to chart for full details  PAIN:  Are you having pain? Yes: NPRS scale: not rated, but pain present Pain location: chronic LBP, bilat LE pain Pain description: pins in feet Aggravating factors:   Relieving factors:    PRECAUTIONS: Fall  RED FLAGS: None   WEIGHT BEARING RESTRICTIONS: No  FALLS: Has patient fallen in last 6 months? Yes. Number of falls 1  LIVING ENVIRONMENT: Lives with: lives alone Lives in: House/apartment Stairs: Noground floor Has following equipment at home: Single point cane, Environmental consultant - 2 wheeled, and Grab bars  PLOF: Independent  PATIENT GOALS: I would like to be able to walk without the fear of falling.  OBJECTIVE:  Note: Objective measures were completed at Evaluation unless otherwise noted.  DIAGNOSTIC FINDINGS:  05/10/2023 MR BRAIN "IMPRESSION: 1. No acute intracranial abnormality or mass. 2. Unchanged mild frontal predominant volume loss and chronic small-vessel disease.     Electronically Signed   By: Orvan Falconer M.D.   On: 05/26/2023 09:07"  COGNITION: Overall cognitive status: Within functional limits for tasks assessed   SENSATION: WFL to light touch, does report pins and needles in feet  COORDINATION: WFL BLE  EDEMA:  Reports some RLE ankle swelling by the end of the day (reports this is a chronic issue)  POSTURE: weight shift right, has scoliosis    LOWER EXTREMITY MMT:   Grossly 4+/5 BLE, greatest weakness in hip flexors hip mm   TRANSFERS: Assistive device utilized: None  Sit to stand: Complete Independence Stand to sit: Complete Independence Chair to chair: Complete Independence GAIT: Gait pattern:  L hip drop, significant weight shift to R in  mid-stance Distance walked: , clinic distances Assistive device utilized: None Level of assistance: SBA   FUNCTIONAL TESTS:  5 times sit to stand: 11.37sec hands-free  - reports some pain in low back with testing : 1.03 m/s : deferred DGI: deferred Screen of gait with scanning (horizontal head turn) mild impairment/increased variability in BOS SLB: able to sustain LLE, unable to hold 1-2 sec RLE   PATIENT SURVEYS:  LEFS 35 FOTO 46 (goal score 52)                                                                                                                              TREATMENT DATE: 06/19/23 NMR  Airex side step on / off 2 x 10 reps ea side   White board balance game standing in adducted stance on airex x 8 min with reaching to ends of BOS to reach magnetic whiteboard letters.    TE LAQ with 3# AW 2 x 15 reps ea LE  Ambulation x 300 ft with 3# AW  Standing march x 15 ea side x 2 rounds with 3# AW   Ambulation x 300 ft with 3# AW  Standing side step with band around ankles ( GTB 2*10 ea side)       PATIENT EDUCATION: Education details: assessment findings, goals, plan Person educated: Patient Education method: Explanation Education comprehension: verbalized understanding  HOME EXERCISE PROGRAM: Access Code: [email or web address] URL: [email or web address] Date: 06/12/2023 Prepared by: Thresa Ross  Exercises - Standing Tandem Balance with Counter Support  - 1 x daily - 7 x weekly - 3 sets - 10 reps - Slow Standing Marching  - 1 x daily - 7 x weekly - 2 sets - 10 reps - Sit to Stand with Arms Crossed  - 1 x daily - 7 x weekly - 2 sets - 12 reps  GOALS: ASSESSMENT: Reviewed with patient: yes  SHORT TERM GOALS: Target date: 07/20/2023    Patient will be independent in home exercise program to improve strength/mobility for better functional independence with ADLs. Baseline: Goal status: INITIAL   LONG TERM GOALS: Target date:  08/31/2023    Patient will increase FOTO score to equal to or greater than   52  to demonstrate statistically significant improvement in mobility and quality of life.  Baseline: 46 Goal status: INITIAL  2.  Patient will complete five times sit to stand test in < 10 seconds indicating an increased LE strength and improved balance. Baseline: 11.37 sec  Goal status: INITIAL  3.  Patient will increase Berg Balance score by > 6 points to demonstrate decreased fall risk during functional activities Baseline: 46/56 Goal status: INITIAL  4.  Patient will increase 10 meter walk test to >1.56m/s as to improve gait speed for better community ambulation. Baseline: 1.03 m/s Goal status: INITIAL   5.  Patient will increase dynamic gait index score to >19/24 as to demonstrate reduced fall risk and improved dynamic gait balance for better safety with community/home ambulation.   Baseline: 17 Goal status: INITIAL  6. Patient will increase lower extremity functional scale to >60/80 to demonstrate improved functional mobility and increased tolerance with ADLs.   Baseline: 35  Goal status: INITIAL   CLINICAL IMPRESSION: Patient arrived with good motivation form completion of pt activities.  Patient continues with higher-level dual task cognitive and motor balance interventions.  Patient responds well to interventions but strict require cues for proper completion of activities.  Patient eager to continue to practice at home and even asked questions regarding purchasing of ankle weights for home-based exercise and was provided with information regarding this.  Pt will continue to benefit from skilled physical therapy intervention to address impairments, improve QOL, and attain therapy goals.     OBJECTIVE IMPAIRMENTS: Abnormal gait, decreased activity tolerance, decreased balance, decreased mobility, difficulty walking, decreased strength, increased edema, improper body mechanics, postural dysfunction,  and pain.   ACTIVITY LIMITATIONS: lifting, bending, sitting, standing, squatting, stairs, and locomotion level  PARTICIPATION LIMITATIONS: cleaning, laundry, shopping, community activity, and yard work  PERSONAL FACTORS: Age, Fitness, Past/current experiences, Time since onset of injury/illness/exacerbation, and 3+ comorbidities: Per chart PMH significant for arthritis, benign bladder mass, complete tear of L RTC, depression, AD, heart murmur, LE DVT (2021 per chart, pt reports this was in 2022), hx of recurrent UTIs, HTN, OSA on CPAP, pelvic pain, BLE edema, DDD, lumbar spondylosis, scoliosis, LBP.  are also affecting patient's functional outcome.   REHAB POTENTIAL: Good  CLINICAL DECISION MAKING: Evolving/moderate complexity  EVALUATION COMPLEXITY: Moderate  PLAN:  PT FREQUENCY:  1-2x/week  PT DURATION: 12 weeks  PLANNED INTERVENTIONS: [id number]- PT Re-evaluation, 97110-Therapeutic exercises, 97530- Therapeutic activity, [id number]- Neuromuscular re-education, 97535- Self Care, 29528- Manual therapy, [id number]- Gait training, [phone]- Orthotic Fit/training, [phone]- Canalith repositioning, [id number]- Splinting, [phone]- Electrical stimulation (manual), [id number]- Ultrasound, Patient/Family education, Balance training, Stair training, Taping, Dry Needling, Joint mobilization, Spinal mobilization, Scar mobilization, Vestibular training, Visual/preceptual remediation/compensation, DME instructions, Cryotherapy, and Moist heat  PLAN FOR NEXT SESSION: Strength and balance interventions, more dynamic than static but static still indicated.    Norman Herrlich, PT 06/19/2023, 4:00 PM

## 2023-06-20 LAB — CMP14+EGFR
ALT: 16 [IU]/L (ref 0–44)
AST: 24 [IU]/L (ref 0–40)
Albumin: 4.1 g/dL (ref 3.8–4.8)
Alkaline Phosphatase: 96 [IU]/L (ref 44–121)
BUN/Creatinine Ratio: 16 (ref 10–24)
BUN: 14 mg/dL (ref 8–27)
Bilirubin Total: 0.4 mg/dL (ref 0.0–1.2)
CO2: 24 mmol/L (ref 20–29)
Calcium: 9.5 mg/dL (ref 8.6–10.2)
Chloride: 101 mmol/L (ref 96–106)
Creatinine, Ser: 0.87 mg/dL (ref 0.76–1.27)
Globulin, Total: 2.9 g/dL (ref 1.5–4.5)
Glucose: 85 mg/dL (ref 70–99)
Potassium: 4.5 mmol/L (ref 3.5–5.2)
Sodium: 136 mmol/L (ref 134–144)
Total Protein: 7 g/dL (ref 6.0–8.5)
eGFR: 91 mL/min/{1.73_m2} (ref >59)

## 2023-06-20 NOTE — Progress Notes (Signed)
Patient notified

## 2023-06-21 ENCOUNTER — Ambulatory Visit: Payer: Medicare Other | Admitting: Physical Therapy

## 2023-06-23 ENCOUNTER — Other Ambulatory Visit: Payer: Self-pay | Admitting: Internal Medicine

## 2023-06-23 DIAGNOSIS — F411 Generalized anxiety disorder: Secondary | ICD-10-CM

## 2023-06-26 ENCOUNTER — Ambulatory Visit: Payer: Medicare Other | Attending: Physician Assistant | Admitting: Physical Therapy

## 2023-06-26 DIAGNOSIS — M79605 Pain in left leg: Secondary | ICD-10-CM | POA: Diagnosis present

## 2023-06-26 DIAGNOSIS — M79604 Pain in right leg: Secondary | ICD-10-CM | POA: Insufficient documentation

## 2023-06-26 DIAGNOSIS — R2681 Unsteadiness on feet: Secondary | ICD-10-CM | POA: Insufficient documentation

## 2023-06-26 DIAGNOSIS — R262 Difficulty in walking, not elsewhere classified: Secondary | ICD-10-CM | POA: Diagnosis present

## 2023-06-26 DIAGNOSIS — M6281 Muscle weakness (generalized): Secondary | ICD-10-CM | POA: Insufficient documentation

## 2023-06-26 DIAGNOSIS — M5459 Other low back pain: Secondary | ICD-10-CM | POA: Insufficient documentation

## 2023-06-26 NOTE — Therapy (Signed)
OUTPATIENT PHYSICAL THERAPY NEURO TREATMENT   Patient Name: Vincent Cole MRN: [id number] DOB:February 13, 1951, 73 y.o., male Today's Date: 06/26/2023   PCP: Margaretann Loveless, MD  REFERRING PROVIDER: Bridgette Habermann, PA-C   END OF SESSION:  PT End of Session - 06/26/23 1312     Visit Number 5    Number of Visits 25    Date for PT Re-Evaluation 08/31/23    Progress Note Due on Visit 10    PT Start Time 1315    PT Stop Time 1357    PT Time Calculation (min) 42 min    Equipment Utilized During Treatment Gait belt    Activity Tolerance Patient tolerated treatment well    Behavior During Therapy WFL for tasks assessed/performed              Past Medical History:  Diagnosis Date   Abdominal pain 01/17/2011   Formatting of this note might be different from the original.  Note: Unchanged - chronic  suprapubic/lower abdominal area since TURP in 2005; urology has now diagnosed, after extensive evaluation, interstitial cystitis; has also had repeat TURP in June 2010, and a surgery on the neck of the bladder in July of 2011     Actinic keratosis    Arthritis    Benign bladder mass    Complete tear of left rotator cuff 04/19/2017   Complication of anesthesia    PONV x 1 in 2010   Depression    Frequency of urination    GERD (gastroesophageal reflux disease) no meds   Heart murmur    was told many years ago   History of DVT of lower extremity 02/03/2020   History of recurrent UTIs    Hypertension    IC (interstitial cystitis)    Nocturia    OSA on CPAP nightly   Pelvic pain in male    PONV (postoperative nausea and vomiting)    Simple renal cyst    Urgency of urination    Urinary hesitancy    Past Surgical History:  Procedure Laterality Date   BUNIONECTOMY Right 01/15/2021   Procedure: Flavia Shipper;  Surgeon: Gwyneth Revels, DPM;  Location: [facility];  Service: Podiatry;  Laterality: Right;   COLONOSCOPY     COLONOSCOPY WITH PROPOFOL N/A 06/25/2021   Procedure:  COLONOSCOPY WITH PROPOFOL;  Surgeon: Regis Bill, MD;  Location: [facility];  Service: Endoscopy;  Laterality: N/A;   CORRECTION OVERLAPPING TOES Right 01/15/2021   Procedure: CORRECTION OVERLAPPING TOES;  Surgeon: Gwyneth Revels, DPM;  Location: [facility];  Service: Podiatry;  Laterality: Right;   CYSTO WITH HYDRODISTENSION  06/02/2011   Procedure: CYSTOSCOPY/HYDRODISTENSION;  Surgeon: Martina Sinner, MD;  Location: [facility];  Service: Urology;  Laterality: N/A;  catheter placement   HAMMER TOE SURGERY Right 01/15/2021   Procedure: HAMMER TOE CORRECTION;  Surgeon: Gwyneth Revels, DPM;  Location: [facility];  Service: Podiatry;  Laterality: Right;   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   JOINT REPLACEMENT     shoulder, left knee   KNEE ARTHROSCOPY     TRANSURETHRAL RESECTION OF PROSTATE  X3   LAST ONE MAY 2012   WEIL OSTEOTOMY Right 01/15/2021   Procedure: WEIL;  Surgeon: Gwyneth Revels, DPM;  Location: [facility];  Service: Podiatry;  Laterality: Right;   Patient Active Problem List   Diagnosis Date Noted   GAD (generalized anxiety disorder) 06/05/2023   Low back pain 08/04/2022   Gastroesophageal reflux disease without esophagitis 07/22/2022  Leg pain 06/19/2022   BMI 36.0-36.9,adult 11/17/2020   Bilateral lower extremity edema 02/03/2020   DDD (degenerative disc disease), lumbosacral 06/22/2018   Lumbar spondylosis 06/22/2018   Pain in limb 10/20/2017   Arthritis 08/02/2017   Insomnia 08/02/2017   OSA (obstructive sleep apnea) 08/02/2017   BNC (bladder neck contracture) 07/31/2017   Frontal lobe deficit 11/22/2016   Essential hypertension, benign 05/21/2015   Arthritis of left knee 05/21/2015   Interstitial cystitis 07/04/2011   Disorder of skin and subcutaneous tissue 04/04/2011   Hyperlipidemia 04/04/2011   Benign prostatic hyperplasia with urinary obstruction 01/17/2011   Scoliosis 01/17/2011    ONSET DATE: 2022  REFERRING DIAG:   Diagnosis  R26.89 (ICD-10-CM) - Imbalance  M79.604 (ICD-10-CM) - Right leg pain  Z86.718 (ICD-10-CM) - History of DVT of lower extremity    THERAPY DIAG:  No diagnosis found.  Rationale for Evaluation and Treatment: Rehabilitation  SUBJECTIVE:                                                                                                                                                                                             SUBJECTIVE STATEMENT: Pt reports no falls or LOB since last visit. Bought some ankle weights and has been using them at home.   From eval: The patient is a pleasant 73 y/o male presenting to PT for imbalance and chronic BLE pain (R>L) that worsened following DVT in 2022. Pt reports worsening balance over three years. Pt also with extensive surgical hx (see chart), but notes pain with walking since knee replacements: R knee replacement in May 2024, L partial knee replacement in 2016. Since R knee replacement LLE has started to hurt again. Pt concerned about LE weakness as well as balance. He feels as if he is walking on "cushions" most of the time, has difficulty with turns, reports hx of scoliosis that also affects posture/gait. Pt has pins and numbness feeling in bilateral feet, reports this started following bunion and hammer toe surgeries. Pt reports occ numbness in bilateral hands while sleeping. He reports 1 fall in the last 6 months that occurred when trying to reach something while in the bath tub. He reports no head injury. Has had a few near-falls as well, usually while walking. He reports no dizziness. He reports occ neck pain, he has chronic LBP and has upcoming nerve block for it. Pt also with hx of L shoulder replacement and upcoming R shoulder replacements this March.  Pt accompanied by: self  PERTINENT HISTORY: Per chart PMH significant for arthritis, benign bladder mass, complete tear of L RTC, depression, AD, heart murmur, LE  DVT (2021 per chart, pt  reports this was in 2022), hx of recurrent UTIs, HTN, OSA on CPAP, pelvic pain, BLE edema, DDD, lumbar spondylosis, scoliosis, LBP. Please refer to chart for full details  PAIN:  Are you having pain? Yes: NPRS scale: not rated, but pain present Pain location: chronic LBP, bilat LE pain Pain description: pins in feet Aggravating factors:   Relieving factors:    PRECAUTIONS: Fall  RED FLAGS: None   WEIGHT BEARING RESTRICTIONS: No  FALLS: Has patient fallen in last 6 months? Yes. Number of falls 1  LIVING ENVIRONMENT: Lives with: lives alone Lives in: House/apartment Stairs: Noground floor Has following equipment at home: Single point cane, Environmental consultant - 2 wheeled, and Grab bars  PLOF: Independent  PATIENT GOALS: I would like to be able to walk without the fear of falling.  OBJECTIVE:  Note: Objective measures were completed at Evaluation unless otherwise noted.  DIAGNOSTIC FINDINGS:  05/10/2023 MR BRAIN "IMPRESSION: 1. No acute intracranial abnormality or mass. 2. Unchanged mild frontal predominant volume loss and chronic small-vessel disease.     Electronically Signed   By: Orvan Falconer M.D.   On: 05/26/2023 09:07"  COGNITION: Overall cognitive status: Within functional limits for tasks assessed   SENSATION: WFL to light touch, does report pins and needles in feet  COORDINATION: WFL BLE  EDEMA:  Reports some RLE ankle swelling by the end of the day (reports this is a chronic issue)  POSTURE: weight shift right, has scoliosis    LOWER EXTREMITY MMT:   Grossly 4+/5 BLE, greatest weakness in hip flexors hip mm   TRANSFERS: Assistive device utilized: None  Sit to stand: Complete Independence Stand to sit: Complete Independence Chair to chair: Complete Independence GAIT: Gait pattern:  L hip drop, significant weight shift to R in mid-stance Distance walked: , clinic distances Assistive device utilized: None Level of assistance: SBA   FUNCTIONAL  TESTS:  5 times sit to stand: 11.37sec hands-free  - reports some pain in low back with testing : 1.03 m/s : deferred DGI: deferred Screen of gait with scanning (horizontal head turn) mild impairment/increased variability in BOS SLB: able to sustain LLE, unable to hold 1-2 sec RLE   PATIENT SURVEYS:  LEFS 35 FOTO 46 (goal score 52)                                                                                                                              TREATMENT DATE: 06/26/23  NMR: To facilitate reeducation of movement, balance, posture, coordination, and/or proprioception/kinesthetic sense.  Balance obstacle course: cone weaving, step up and step down, airex beam, airex step up and turn and step down, and set of stairs without UE assist x 3 laps total  Sidestepping on airex beam x 15 steps to ea side   TA- To improve functional movements patterns for everyday tasks  STS x 10  Ambulation x 320 ft with 4#AW STS x 10  Ambulation x320 ft with 4# AW  Standing march x 10 ea side x 2 rounds with 4# AW   TE- To improve strength, endurance, mobility, and function of specific targeted muscle groups or improve joint range of motion or improve muscle flexibility Leg Press   - 40 # x 15 reps  - 55# x 10 reps  - 62.5# 2 x 10 reps   LAQ with 4# AW 2 x 10 reps ea LE- cues for slow and controlled movements     PATIENT EDUCATION: Education details: assessment findings, goals, plan Person educated: Patient Education method: Explanation Education comprehension: verbalized understanding  HOME EXERCISE PROGRAM: Access Code: [email or web address] URL: [email or web address] Date: 06/12/2023 Prepared by: Thresa Ross  Exercises - Standing Tandem Balance with Counter Support  - 1 x daily - 7 x weekly - 3 sets - 10 reps - Slow Standing Marching  - 1 x daily - 7 x weekly - 2 sets - 10 reps - Sit to Stand with Arms Crossed  - 1 x daily - 7 x weekly - 2 sets - 12  reps  GOALS: ASSESSMENT: Reviewed with patient: yes  SHORT TERM GOALS: Target date: 07/20/2023    Patient will be independent in home exercise program to improve strength/mobility for better functional independence with ADLs. Baseline: Goal status: INITIAL   LONG TERM GOALS: Target date: 08/31/2023    Patient will increase FOTO score to equal to or greater than   52  to demonstrate statistically significant improvement in mobility and quality of life.  Baseline: 46 Goal status: INITIAL  2.  Patient will complete five times sit to stand test in < 10 seconds indicating an increased LE strength and improved balance. Baseline: 11.37 sec  Goal status: INITIAL  3.  Patient will increase Berg Balance score by > 6 points to demonstrate decreased fall risk during functional activities Baseline: 46/56 Goal status: INITIAL  4.  Patient will increase 10 meter walk test to >1.8m/s as to improve gait speed for better community ambulation. Baseline: 1.03 m/s Goal status: INITIAL   5.  Patient will increase dynamic gait index score to >19/24 as to demonstrate reduced fall risk and improved dynamic gait balance for better safety with community/home ambulation.   Baseline: 17 Goal status: INITIAL  6. Patient will increase lower extremity functional scale to >60/80 to demonstrate improved functional mobility and increased tolerance with ADLs.   Baseline: 35  Goal status: INITIAL   CLINICAL IMPRESSION: Patient arrived with good motivation form completion of pt activities.  Patient continues with higher-level dual task cognitive and motor balance interventions.  Patient responds well to interventions but strict require cues for proper completion of activities.  Patient eager to continue to practice at home and even asked questions regarding purchasing of ankle weights for home-based exercise and was provided with information regarding this.  Pt will continue to benefit from skilled physical  therapy intervention to address impairments, improve QOL, and attain therapy goals.     OBJECTIVE IMPAIRMENTS: Abnormal gait, decreased activity tolerance, decreased balance, decreased mobility, difficulty walking, decreased strength, increased edema, improper body mechanics, postural dysfunction, and pain.   ACTIVITY LIMITATIONS: lifting, bending, sitting, standing, squatting, stairs, and locomotion level  PARTICIPATION LIMITATIONS: cleaning, laundry, shopping, community activity, and yard work  PERSONAL FACTORS: Age, Fitness, Past/current experiences, Time since onset of injury/illness/exacerbation, and 3+ comorbidities: Per chart PMH significant for arthritis, benign bladder mass, complete tear of L RTC, depression, AD, heart murmur, LE DVT (2021 per chart, pt  reports this was in 2022), hx of recurrent UTIs, HTN, OSA on CPAP, pelvic pain, BLE edema, DDD, lumbar spondylosis, scoliosis, LBP.  are also affecting patient's functional outcome.   REHAB POTENTIAL: Good  CLINICAL DECISION MAKING: Evolving/moderate complexity  EVALUATION COMPLEXITY: Moderate  PLAN:  PT FREQUENCY: 1-2x/week  PT DURATION: 12 weeks  PLANNED INTERVENTIONS: 97164- PT Re-evaluation, 97110-Therapeutic exercises, 97530- Therapeutic activity, [id number]- Neuromuscular re-education, 97535- Self Care, 16109- Manual therapy, [id number]- Gait training, [phone]- Orthotic Fit/training, [phone]- Canalith repositioning, [id number]- Splinting, [id number]- Electrical stimulation (manual), [id number]- Ultrasound, Patient/Family education, Balance training, Stair training, Taping, Dry Needling, Joint mobilization, Spinal mobilization, Scar mobilization, Vestibular training, Visual/preceptual remediation/compensation, DME instructions, Cryotherapy, and Moist heat  PLAN FOR NEXT SESSION: Strength and balance interventions, more dynamic than static but static still indicated.    Norman Herrlich, PT 06/26/2023, 1:14 PM

## 2023-06-28 ENCOUNTER — Ambulatory Visit: Payer: Medicare Other | Admitting: Physical Therapy

## 2023-06-28 DIAGNOSIS — R2681 Unsteadiness on feet: Secondary | ICD-10-CM

## 2023-06-28 DIAGNOSIS — M6281 Muscle weakness (generalized): Secondary | ICD-10-CM | POA: Diagnosis not present

## 2023-06-28 DIAGNOSIS — M79604 Pain in right leg: Secondary | ICD-10-CM

## 2023-06-28 DIAGNOSIS — M5459 Other low back pain: Secondary | ICD-10-CM

## 2023-06-28 DIAGNOSIS — R262 Difficulty in walking, not elsewhere classified: Secondary | ICD-10-CM

## 2023-06-28 NOTE — Therapy (Signed)
 OUTPATIENT PHYSICAL THERAPY NEURO TREATMENT   Patient Name: Vincent Cole MRN: [id number] DOB:1951-04-04, 73 y.o., male Today's Date: 06/28/2023   PCP: Fernand Fredy RAMAN, MD  REFERRING PROVIDER: Jonette Lauraine BRAVO, PA-C   END OF SESSION:  PT End of Session - 06/28/23 1146     Visit Number 6    Number of Visits 25    Date for PT Re-Evaluation 08/31/23    Progress Note Due on Visit 10    PT Start Time 1148    PT Stop Time 1228    PT Time Calculation (min) 40 min    Equipment Utilized During Treatment Gait belt    Activity Tolerance Patient tolerated treatment well    Behavior During Therapy [facility] for tasks assessed/performed               Past Medical History:  Diagnosis Date   Abdominal pain 01/17/2011   Formatting of this note might be different from the original.  Note: Unchanged - chronic  suprapubic/lower abdominal area since TURP in 2005; urology has now diagnosed, after extensive evaluation, interstitial cystitis; has also had repeat TURP in June 2010, and a surgery on the neck of the bladder in July of 2011     Actinic keratosis    Arthritis    Benign bladder mass    Complete tear of left rotator cuff 04/19/2017   Complication of anesthesia    PONV x 1 in 2010   Depression    Frequency of urination    GERD (gastroesophageal reflux disease) no meds   Heart murmur    was told many years ago   History of DVT of lower extremity 02/03/2020   History of recurrent UTIs    Hypertension    IC (interstitial cystitis)    Nocturia    OSA on CPAP nightly   Pelvic pain in male    PONV (postoperative nausea and vomiting)    Simple renal cyst    Urgency of urination    Urinary hesitancy    Past Surgical History:  Procedure Laterality Date   BUNIONECTOMY Right 01/15/2021   Procedure: ZELL HAI;  Surgeon: Ashley Soulier, DPM;  Location: [facility];  Service: Podiatry;  Laterality: Right;   COLONOSCOPY     COLONOSCOPY WITH PROPOFOL  N/A 06/25/2021   Procedure:  COLONOSCOPY WITH PROPOFOL ;  Surgeon: Maryruth Ole DASEN, MD;  Location: [facility];  Service: Endoscopy;  Laterality: N/A;   CORRECTION OVERLAPPING TOES Right 01/15/2021   Procedure: CORRECTION OVERLAPPING TOES;  Surgeon: Ashley Soulier, DPM;  Location: [facility];  Service: Podiatry;  Laterality: Right;   CYSTO WITH HYDRODISTENSION  06/02/2011   Procedure: CYSTOSCOPY/HYDRODISTENSION;  Surgeon: Glendia DELENA Elizabeth, MD;  Location: [facility];  Service: Urology;  Laterality: N/A;  catheter placement   HAMMER TOE SURGERY Right 01/15/2021   Procedure: HAMMER TOE CORRECTION;  Surgeon: Ashley Soulier, DPM;  Location: [facility];  Service: Podiatry;  Laterality: Right;   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   JOINT REPLACEMENT     shoulder, left knee   KNEE ARTHROSCOPY     TRANSURETHRAL RESECTION OF PROSTATE  X3   LAST ONE MAY 2012   WEIL OSTEOTOMY Right 01/15/2021   Procedure: WEIL;  Surgeon: Ashley Soulier, DPM;  Location: [facility];  Service: Podiatry;  Laterality: Right;   Patient Active Problem List   Diagnosis Date Noted   GAD (generalized anxiety disorder) 06/05/2023   Low back pain 08/04/2022   Gastroesophageal reflux disease without esophagitis 07/22/2022  Leg pain 06/19/2022   BMI 36.0-36.9,adult 11/17/2020   Bilateral lower extremity edema 02/03/2020   DDD (degenerative disc disease), lumbosacral 06/22/2018   Lumbar spondylosis 06/22/2018   Pain in limb 10/20/2017   Arthritis 08/02/2017   Insomnia 08/02/2017   OSA (obstructive sleep apnea) 08/02/2017   BNC (bladder neck contracture) 07/31/2017   Frontal lobe deficit 11/22/2016   Essential hypertension, benign 05/21/2015   Arthritis of left knee 05/21/2015   Interstitial cystitis 07/04/2011   Disorder of skin and subcutaneous tissue 04/04/2011   Hyperlipidemia 04/04/2011   Benign prostatic hyperplasia with urinary obstruction 01/17/2011   Scoliosis 01/17/2011    ONSET DATE: 2022  REFERRING DIAG:   Diagnosis  R26.89 (ICD-10-CM) - Imbalance  M79.604 (ICD-10-CM) - Right leg pain  Z86.718 (ICD-10-CM) - History of DVT of lower extremity    THERAPY DIAG:  Muscle weakness (generalized)  Unsteadiness on feet  Difficulty in walking, not elsewhere classified  Pain in right leg  Other low back pain  Rationale for Evaluation and Treatment: Rehabilitation  SUBJECTIVE:                                                                                                                                                                                             SUBJECTIVE STATEMENT: Pt reports that he is doing well. Mild back pain. Had a busy morning and rest of the day with multiple appointments including pain management and injection for LBP and dentist appointment.       From eval: The patient is a pleasant 73 y/o male presenting to PT for imbalance and chronic BLE pain (R>L) that worsened following DVT in 2022. Pt reports worsening balance over three years. Pt also with extensive surgical hx (see chart), but notes pain with walking since knee replacements: R knee replacement in May 2024, L partial knee replacement in 2016. Since R knee replacement LLE has started to hurt again. Pt concerned about LE weakness as well as balance. He feels as if he is walking on cushions most of the time, has difficulty with turns, reports hx of scoliosis that also affects posture/gait. Pt has pins and numbness feeling in bilateral feet, reports this started following bunion and hammer toe surgeries. Pt reports occ numbness in bilateral hands while sleeping. He reports 1 fall in the last 6 months that occurred when trying to reach something while in the bath tub. He reports no head injury. Has had a few near-falls as well, usually while walking. He reports no dizziness. He reports occ neck pain, he has chronic LBP and has upcoming nerve block for it. Pt also with hx of  L shoulder replacement and upcoming R shoulder  replacements this March.  Pt accompanied by: self  PERTINENT HISTORY: Per chart PMH significant for arthritis, benign bladder mass, complete tear of L RTC, depression, AD, heart murmur, LE DVT (2021 per chart, pt reports this was in 2022), hx of recurrent UTIs, HTN, OSA on CPAP, pelvic pain, BLE edema, DDD, lumbar spondylosis, scoliosis, LBP. Please refer to chart for full details  PAIN:  Are you having pain? Yes: NPRS scale: 4/10 Pain location: chronic LBP, bilat LE pain Pain description: pins in feet Aggravating factors:   Relieving factors:    PRECAUTIONS: Fall  RED FLAGS: None   WEIGHT BEARING RESTRICTIONS: No  FALLS: Has patient fallen in last 6 months? Yes. Number of falls 1  LIVING ENVIRONMENT: Lives with: lives alone Lives in: House/apartment Stairs: Noground floor Has following equipment at home: Single point cane, Environmental Consultant - 2 wheeled, and Grab bars  PLOF: Independent  PATIENT GOALS: I would like to be able to walk without the fear of falling.  OBJECTIVE:  Note: Objective measures were completed at Evaluation unless otherwise noted.  DIAGNOSTIC FINDINGS:  05/10/2023 MR BRAIN IMPRESSION: 1. No acute intracranial abnormality or mass. 2. Unchanged mild frontal predominant volume loss and chronic small-vessel disease.     Electronically Signed   By: Ryan Chess M.D.   On: 05/26/2023 09:07  COGNITION: Overall cognitive status: Within functional limits for tasks assessed   SENSATION: WFL to light touch, does report pins and needles in feet  COORDINATION: WFL BLE  EDEMA:  Reports some RLE ankle swelling by the end of the day (reports this is a chronic issue)  POSTURE: weight shift right, has scoliosis    LOWER EXTREMITY MMT:   Grossly 4+/5 BLE, greatest weakness in hip flexors hip mm   TRANSFERS: Assistive device utilized: None  Sit to stand: Complete Independence Stand to sit: Complete Independence Chair to chair: Complete  Independence GAIT: Gait pattern:  L hip drop, significant weight shift to R in mid-stance Distance walked: , clinic distances Assistive device utilized: None Level of assistance: SBA   FUNCTIONAL TESTS:  5 times sit to stand: 11.37sec hands-free  - reports some pain in low back with testing : 1.03 m/s : deferred DGI: deferred Screen of gait with scanning (horizontal head turn) mild impairment/increased variability in BOS SLB: able to sustain LLE, unable to hold 1-2 sec RLE   PATIENT SURVEYS:  LEFS 35 FOTO 46 (goal score 52)                                                                                                                              TREATMENT DATE: 06/28/23 Throughout session, PT provided CGA for safety with gait belt in place  NMR: To facilitate reeducation of movement, balance, posture, coordination, and/or proprioception/kinesthetic sense.  Balance obstacle course: cone weaving, step up and step down 4inch step, airex beam, x 3 laps total without resistance x 4  laps with 4 # AW.  Standing on airex pad with 1 LE on 6 inch step 3 x 15 sec bil with CGA for awareness of lateral LOB to the R.   TA- To improve functional movements patterns for everyday tasks  Ambulation x 450 ft with 4#AW Reciprocal stepping over half bolster x 12 bil with light single UE Support; 4#AW Ambulation x450 ft with 4# AW Lateral stepping over cane in floor x 12 bil without UE support; 4#AW.    PATIENT EDUCATION: Education details: assessment findings, goals, plan Person educated: Patient Education method: Explanation Education comprehension: verbalized understanding  HOME EXERCISE PROGRAM: Access Code: [email or web address] URL: [email or web address] Date: 06/12/2023 Prepared by: Lonni Gainer  Exercises - Standing Tandem Balance with Counter Support  - 1 x daily - 7 x weekly - 3 sets - 10 reps - Slow Standing Marching  - 1 x daily - 7 x weekly - 2 sets - 10  reps - Sit to Stand with Arms Crossed  - 1 x daily - 7 x weekly - 2 sets - 12 reps  GOALS: ASSESSMENT: Reviewed with patient: yes  SHORT TERM GOALS: Target date: 07/20/2023    Patient will be independent in home exercise program to improve strength/mobility for better functional independence with ADLs. Baseline: Goal status: INITIAL   LONG TERM GOALS: Target date: 08/31/2023    Patient will increase FOTO score to equal to or greater than   52  to demonstrate statistically significant improvement in mobility and quality of life.  Baseline: 46 Goal status: INITIAL  2.  Patient will complete five times sit to stand test in < 10 seconds indicating an increased LE strength and improved balance. Baseline: 11.37 sec  Goal status: INITIAL  3.  Patient will increase Berg Balance score by > 6 points to demonstrate decreased fall risk during functional activities Baseline: 46/56 Goal status: INITIAL  4.  Patient will increase 10 meter walk test to >1.96m/s as to improve gait speed for better community ambulation. Baseline: 1.03 m/s Goal status: INITIAL   5.  Patient will increase dynamic gait index score to >19/24 as to demonstrate reduced fall risk and improved dynamic gait balance for better safety with community/home ambulation.   Baseline: 17 Goal status: INITIAL  6. Patient will increase lower extremity functional scale to >60/80 to demonstrate improved functional mobility and increased tolerance with ADLs.   Baseline: 35  Goal status: INITIAL   CLINICAL IMPRESSION: Patient arrived with good motivation form completion of pt activities.  Patient continues with higher-level dynamic balance tasks. Pt demonstrates improved righting reactions with LOB on airex beam and pad with cues for awareness of lateral R LOB. Tolerated increased distance of gait training, with AW, but therapeutic rest break required between bouts.   Pt will continue to benefit from skilled physical therapy  intervention to address impairments, improve QOL, and attain therapy goals.     OBJECTIVE IMPAIRMENTS: Abnormal gait, decreased activity tolerance, decreased balance, decreased mobility, difficulty walking, decreased strength, increased edema, improper body mechanics, postural dysfunction, and pain.   ACTIVITY LIMITATIONS: lifting, bending, sitting, standing, squatting, stairs, and locomotion level  PARTICIPATION LIMITATIONS: cleaning, laundry, shopping, community activity, and yard work  PERSONAL FACTORS: Age, Fitness, Past/current experiences, Time since onset of injury/illness/exacerbation, and 3+ comorbidities: Per chart PMH significant for arthritis, benign bladder mass, complete tear of L RTC, depression, AD, heart murmur, LE DVT (2021 per chart, pt reports this was in 2022), hx of recurrent UTIs, HTN, OSA on CPAP, pelvic  pain, BLE edema, DDD, lumbar spondylosis, scoliosis, LBP.  are also affecting patient's functional outcome.   REHAB POTENTIAL: Good  CLINICAL DECISION MAKING: Evolving/moderate complexity  EVALUATION COMPLEXITY: Moderate  PLAN:  PT FREQUENCY: 1-2x/week  PT DURATION: 12 weeks  PLANNED INTERVENTIONS: [id number]- PT Re-evaluation, 97110-Therapeutic exercises, 97530- Therapeutic activity, [id number]- Neuromuscular re-education, 97535- Self Care, [id number]- Manual therapy, [id number]- Gait training, [phone]- Orthotic Fit/training, [phone]- Canalith repositioning, [id number]- Splinting, [id number]- Electrical stimulation (manual), [id number]- Ultrasound, Patient/Family education, Balance training, Stair training, Taping, Dry Needling, Joint mobilization, Spinal mobilization, Scar mobilization, Vestibular training, Visual/preceptual remediation/compensation, DME instructions, Cryotherapy, and Moist heat  PLAN FOR NEXT SESSION: Strength and balance interventions, more dynamic than static but static still indicated.    Massie FORBES Dollar, PT 06/28/2023, 11:47 AM

## 2023-07-03 ENCOUNTER — Ambulatory Visit: Payer: Medicare Other | Admitting: Physical Therapy

## 2023-07-03 DIAGNOSIS — R2681 Unsteadiness on feet: Secondary | ICD-10-CM

## 2023-07-03 DIAGNOSIS — M6281 Muscle weakness (generalized): Secondary | ICD-10-CM

## 2023-07-03 DIAGNOSIS — M79604 Pain in right leg: Secondary | ICD-10-CM

## 2023-07-03 DIAGNOSIS — M79605 Pain in left leg: Secondary | ICD-10-CM

## 2023-07-03 DIAGNOSIS — R262 Difficulty in walking, not elsewhere classified: Secondary | ICD-10-CM

## 2023-07-03 DIAGNOSIS — M5459 Other low back pain: Secondary | ICD-10-CM

## 2023-07-03 NOTE — Therapy (Signed)
 OUTPATIENT PHYSICAL THERAPY NEURO TREATMENT   Patient Name: Vincent Cole MRN: [id number] DOB:1951/02/09, 73 y.o., male Today's Date: 07/03/2023   PCP: Aisha Hove, MD  REFERRING PROVIDER: Americo Justice, PA-C   END OF SESSION:  PT End of Session - 07/03/23 1316     Visit Number 7    Number of Visits 25    Date for PT Re-Evaluation 08/31/23    Progress Note Due on Visit 10    PT Start Time 1317    PT Stop Time 1358    PT Time Calculation (min) 41 min    Equipment Utilized During Treatment Gait belt    Activity Tolerance Patient tolerated treatment well    Behavior During Therapy [facility] for tasks assessed/performed               Past Medical History:  Diagnosis Date   Abdominal pain 01/17/2011   Formatting of this note might be different from the original.  Note: Unchanged - chronic  suprapubic/lower abdominal area since TURP in 2005; urology has now diagnosed, after extensive evaluation, interstitial cystitis; has also had repeat TURP in June 2010, and a surgery on the neck of the bladder in July of 2011     Actinic keratosis    Arthritis    Benign bladder mass    Complete tear of left rotator cuff 04/19/2017   Complication of anesthesia    PONV x 1 in 2010   Depression    Frequency of urination    GERD (gastroesophageal reflux disease) no meds   Heart murmur    was told many years ago   History of DVT of lower extremity 02/03/2020   History of recurrent UTIs    Hypertension    IC (interstitial cystitis)    Nocturia    OSA on CPAP nightly   Pelvic pain in male    PONV (postoperative nausea and vomiting)    Simple renal cyst    Urgency of urination    Urinary hesitancy    Past Surgical History:  Procedure Laterality Date   BUNIONECTOMY Right 01/15/2021   Procedure: Berta Brittle;  Surgeon: Anell Baptist, DPM;  Location: [facility];  Service: Podiatry;  Laterality: Right;   COLONOSCOPY     COLONOSCOPY WITH PROPOFOL  N/A 06/25/2021   Procedure:  COLONOSCOPY WITH PROPOFOL ;  Surgeon: Shane Darling, MD;  Location: [facility];  Service: Endoscopy;  Laterality: N/A;   CORRECTION OVERLAPPING TOES Right 01/15/2021   Procedure: CORRECTION OVERLAPPING TOES;  Surgeon: Anell Baptist, DPM;  Location: [facility];  Service: Podiatry;  Laterality: Right;   CYSTO WITH HYDRODISTENSION  06/02/2011   Procedure: CYSTOSCOPY/HYDRODISTENSION;  Surgeon: Devorah Fonder, MD;  Location: [facility];  Service: Urology;  Laterality: N/A;  catheter placement   HAMMER TOE SURGERY Right 01/15/2021   Procedure: HAMMER TOE CORRECTION;  Surgeon: Anell Baptist, DPM;  Location: [facility];  Service: Podiatry;  Laterality: Right;   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   JOINT REPLACEMENT     shoulder, left knee   KNEE ARTHROSCOPY     TRANSURETHRAL RESECTION OF PROSTATE  X3   LAST ONE MAY 2012   WEIL OSTEOTOMY Right 01/15/2021   Procedure: WEIL;  Surgeon: Anell Baptist, DPM;  Location: [facility];  Service: Podiatry;  Laterality: Right;   Patient Active Problem List   Diagnosis Date Noted   GAD (generalized anxiety disorder) 06/05/2023   Low back pain 08/04/2022   Gastroesophageal reflux disease without esophagitis 07/22/2022  Leg pain 06/19/2022   BMI 36.0-36.9,adult 11/17/2020   Bilateral lower extremity edema 02/03/2020   DDD (degenerative disc disease), lumbosacral 06/22/2018   Lumbar spondylosis 06/22/2018   Pain in limb 10/20/2017   Arthritis 08/02/2017   Insomnia 08/02/2017   OSA (obstructive sleep apnea) 08/02/2017   BNC (bladder neck contracture) 07/31/2017   Frontal lobe deficit 11/22/2016   Essential hypertension, benign 05/21/2015   Arthritis of left knee 05/21/2015   Interstitial cystitis 07/04/2011   Disorder of skin and subcutaneous tissue 04/04/2011   Hyperlipidemia 04/04/2011   Benign prostatic hyperplasia with urinary obstruction 01/17/2011   Scoliosis 01/17/2011    ONSET DATE: 2022  REFERRING DIAG:   Diagnosis  R26.89 (ICD-10-CM) - Imbalance  M79.604 (ICD-10-CM) - Right leg pain  Z86.718 (ICD-10-CM) - History of DVT of lower extremity    THERAPY DIAG:  Muscle weakness (generalized)  Unsteadiness on feet  Difficulty in walking, not elsewhere classified  Pain in right leg  Other low back pain  Pain in left leg  Rationale for Evaluation and Treatment: Rehabilitation  SUBJECTIVE:                                                                                                                                                                                             SUBJECTIVE STATEMENT: Pt reports that he is doing well. Mild back and Bil knee pain. Back was bothering him a lot on Friday, but feeling better today.      From eval: The patient is a pleasant 73 y/o male presenting to PT for imbalance and chronic BLE pain (R>L) that worsened following DVT in 2022. Pt reports worsening balance over three years. Pt also with extensive surgical hx (see chart), but notes pain with walking since knee replacements: R knee replacement in May 2024, L partial knee replacement in 2016. Since R knee replacement LLE has started to hurt again. Pt concerned about LE weakness as well as balance. He feels as if he is walking on "cushions" most of the time, has difficulty with turns, reports hx of scoliosis that also affects posture/gait. Pt has pins and numbness feeling in bilateral feet, reports this started following bunion and hammer toe surgeries. Pt reports occ numbness in bilateral hands while sleeping. He reports 1 fall in the last 6 months that occurred when trying to reach something while in the bath tub. He reports no head injury. Has had a few near-falls as well, usually while walking. He reports no dizziness. He reports occ neck pain, he has chronic LBP and has upcoming nerve block for it. Pt also with hx of L shoulder replacement  and upcoming R shoulder replacements this March.  Pt accompanied  by: self  PERTINENT HISTORY: Per chart PMH significant for arthritis, benign bladder mass, complete tear of L RTC, depression, AD, heart murmur, LE DVT (2021 per chart, pt reports this was in 2022), hx of recurrent UTIs, HTN, OSA on CPAP, pelvic pain, BLE edema, DDD, lumbar spondylosis, scoliosis, LBP. Please refer to chart for full details  PAIN:  Are you having pain? Yes: NPRS scale: 4/10 Pain location: chronic LBP, bilat LE pain Pain description: pins in feet Aggravating factors:   Relieving factors:    PRECAUTIONS: Fall  RED FLAGS: None   WEIGHT BEARING RESTRICTIONS: No  FALLS: Has patient fallen in last 6 months? Yes. Number of falls 1  LIVING ENVIRONMENT: Lives with: lives alone Lives in: House/apartment Stairs: Noground floor Has following equipment at home: Single point cane, Environmental consultant - 2 wheeled, and Grab bars  PLOF: Independent  PATIENT GOALS: I would like to be able to walk without the fear of falling.  OBJECTIVE:  Note: Objective measures were completed at Evaluation unless otherwise noted.  DIAGNOSTIC FINDINGS:  05/10/2023 MR BRAIN "IMPRESSION: 1. No acute intracranial abnormality or mass. 2. Unchanged mild frontal predominant volume loss and chronic small-vessel disease.     Electronically Signed   By: Audra Blend M.D.   On: 05/26/2023 09:07"  COGNITION: Overall cognitive status: Within functional limits for tasks assessed   SENSATION: WFL to light touch, does report pins and needles in feet  COORDINATION: WFL BLE  EDEMA:  Reports some RLE ankle swelling by the end of the day (reports this is a chronic issue)  POSTURE: weight shift right, has scoliosis    LOWER EXTREMITY MMT:   Grossly 4+/5 BLE, greatest weakness in hip flexors hip mm   TRANSFERS: Assistive device utilized: None  Sit to stand: Complete Independence Stand to sit: Complete Independence Chair to chair: Complete Independence GAIT: Gait pattern:  L hip drop, significant  weight shift to R in mid-stance Distance walked: , clinic distances Assistive device utilized: None Level of assistance: SBA   FUNCTIONAL TESTS:  5 times sit to stand: 11.37sec hands-free  - reports some pain in low back with testing : 1.03 m/s : deferred DGI: deferred Screen of gait with scanning (horizontal head turn) mild impairment/increased variability in BOS SLB: able to sustain LLE, unable to hold 1-2 sec RLE   PATIENT SURVEYS:  LEFS 35 FOTO 46 (goal score 52)                                                                                                                              TREATMENT DATE: 07/03/23   Throughout session, PT provided CGA for safety with gait belt in place  NMR: To facilitate reeducation of movement, balance, posture, coordination, and/or proprioception/kinesthetic sense.  On airex pad Lateral step up/over 6 inch step from airex pad 2x 8bil  Stance with 1 up/1 down 3 x 20  sec bil  Reciprocal foot tap on 6 inch step with pad on step x 12 bil  SLS 3 x 10 sec bil with intermittent assist   Lateral stepping over bolster x 15  Sit<>stand 2x 10 no UE support mild knee pain on the second bout Tandem stance on level surface  3 x 20 sce  Standing on half bolster AP control 2x 30 sec  Standing on bolster AP rock x 15 each with intermittent UE support.   Gait training  Gait without resistance 2x 464ft. Pt rates as easy on this day. Increased "limp" with  fatigue that patient attributes to scoliosis  as well as increased bil foot slap with fatigue   PATIENT EDUCATION: Education details: assessment findings, goals, plan Person educated: Patient Education method: Explanation Education comprehension: verbalized understanding  HOME EXERCISE PROGRAM: Access Code: [email or web address] URL: [email or web address] Date: 06/12/2023 Prepared by: Marlynn Singer  Exercises - Standing Tandem Balance with Counter Support  - 1 x daily - 7 x  weekly - 3 sets - 10 reps - Slow Standing Marching  - 1 x daily - 7 x weekly - 2 sets - 10 reps - Sit to Stand with Arms Crossed  - 1 x daily - 7 x weekly - 2 sets - 12 reps  GOALS: ASSESSMENT: Reviewed with patient: yes  SHORT TERM GOALS: Target date: 07/20/2023    Patient will be independent in home exercise program to improve strength/mobility for better functional independence with ADLs. Baseline: Goal status: INITIAL   LONG TERM GOALS: Target date: 08/31/2023    Patient will increase FOTO score to equal to or greater than   52  to demonstrate statistically significant improvement in mobility and quality of life.  Baseline: 46 Goal status: INITIAL  2.  Patient will complete five times sit to stand test in < 10 seconds indicating an increased LE strength and improved balance. Baseline: 11.37 sec  Goal status: INITIAL  3.  Patient will increase Berg Balance score by > 6 points to demonstrate decreased fall risk during functional activities Baseline: 46/56 Goal status: INITIAL  4.  Patient will increase 10 meter walk test to >1.62m/s as to improve gait speed for better community ambulation. Baseline: 1.03 m/s Goal status: INITIAL   5.  Patient will increase dynamic gait index score to >19/24 as to demonstrate reduced fall risk and improved dynamic gait balance for better safety with community/home ambulation.   Baseline: 17 Goal status: INITIAL  6. Patient will increase lower extremity functional scale to >60/80 to demonstrate improved functional mobility and increased tolerance with ADLs.   Baseline: 35  Goal status: INITIAL   CLINICAL IMPRESSION: Patient arrived with good motivation for completion of pt activities.  Patient continues with higher-level balance and gait training. Pt demonstrating improved SLS and tandem balance as well as dynamic control on unlevel surface. Noted to have increased foot slap on BLE with fatigue in prolonged gait.   Pt will continue to  benefit from skilled physical therapy intervention to address impairments, improve QOL, and attain therapy goals.     OBJECTIVE IMPAIRMENTS: Abnormal gait, decreased activity tolerance, decreased balance, decreased mobility, difficulty walking, decreased strength, increased edema, improper body mechanics, postural dysfunction, and pain.   ACTIVITY LIMITATIONS: lifting, bending, sitting, standing, squatting, stairs, and locomotion level  PARTICIPATION LIMITATIONS: cleaning, laundry, shopping, community activity, and yard work  PERSONAL FACTORS: Age, Fitness, Past/current experiences, Time since onset of injury/illness/exacerbation, and 3+ comorbidities: Per chart PMH significant for arthritis, benign bladder mass,  complete tear of L RTC, depression, AD, heart murmur, LE DVT (2021 per chart, pt reports this was in 2022), hx of recurrent UTIs, HTN, OSA on CPAP, pelvic pain, BLE edema, DDD, lumbar spondylosis, scoliosis, LBP.  are also affecting patient's functional outcome.   REHAB POTENTIAL: Good  CLINICAL DECISION MAKING: Evolving/moderate complexity  EVALUATION COMPLEXITY: Moderate  PLAN:  PT FREQUENCY: 1-2x/week  PT DURATION: 12 weeks  PLANNED INTERVENTIONS: [id number]- PT Re-evaluation, 97110-Therapeutic exercises, 97530- Therapeutic activity, [id number]- Neuromuscular re-education, 97535- Self Care, [id number]- Manual therapy, [id number]- Gait training, [phone]- Orthotic Fit/training, [phone]- Canalith repositioning, [id number]- Splinting, [id number]- Electrical stimulation (manual), [id number]- Ultrasound, Patient/Family education, Balance training, Stair training, Taping, Dry Needling, Joint mobilization, Spinal mobilization, Scar mobilization, Vestibular training, Visual/preceptual remediation/compensation, DME instructions, Cryotherapy, and Moist heat  PLAN FOR NEXT SESSION:   Strength and balance interventions, more dynamic than static but static still indicated.    Barbara Book, PT 07/03/2023, 2:39  PM

## 2023-07-05 ENCOUNTER — Ambulatory Visit: Payer: Medicare Other | Admitting: Physical Therapy

## 2023-07-05 DIAGNOSIS — M6281 Muscle weakness (generalized): Secondary | ICD-10-CM

## 2023-07-05 DIAGNOSIS — R262 Difficulty in walking, not elsewhere classified: Secondary | ICD-10-CM

## 2023-07-05 DIAGNOSIS — R2681 Unsteadiness on feet: Secondary | ICD-10-CM

## 2023-07-05 NOTE — Therapy (Signed)
 OUTPATIENT PHYSICAL THERAPY NEURO TREATMENT   Patient Name: Vincent Cole MRN: [id number] DOB:29-Mar-1951, 73 y.o., male Today's Date: 07/05/2023   PCP: Margaretann Loveless, MD  REFERRING PROVIDER: Bridgette Habermann, PA-C   END OF SESSION:  PT End of Session - 07/05/23 1154     Visit Number 8    Number of Visits 25    Date for PT Re-Evaluation 08/31/23    Progress Note Due on Visit 10    PT Start Time 1151    PT Stop Time 1229    PT Time Calculation (min) 38 min    Equipment Utilized During Treatment Gait belt    Activity Tolerance Patient tolerated treatment well    Behavior During Therapy WFL for tasks assessed/performed               Past Medical History:  Diagnosis Date   Abdominal pain 01/17/2011   Formatting of this note might be different from the original.  Note: Unchanged - chronic  suprapubic/lower abdominal area since TURP in 2005; urology has now diagnosed, after extensive evaluation, interstitial cystitis; has also had repeat TURP in June 2010, and a surgery on the neck of the bladder in July of 2011     Actinic keratosis    Arthritis    Benign bladder mass    Complete tear of left rotator cuff 04/19/2017   Complication of anesthesia    PONV x 1 in 2010   Depression    Frequency of urination    GERD (gastroesophageal reflux disease) no meds   Heart murmur    was told many years ago   History of DVT of lower extremity 02/03/2020   History of recurrent UTIs    Hypertension    IC (interstitial cystitis)    Nocturia    OSA on CPAP nightly   Pelvic pain in male    PONV (postoperative nausea and vomiting)    Simple renal cyst    Urgency of urination    Urinary hesitancy    Past Surgical History:  Procedure Laterality Date   BUNIONECTOMY Right 01/15/2021   Procedure: Flavia Shipper;  Surgeon: Gwyneth Revels, DPM;  Location: [facility];  Service: Podiatry;  Laterality: Right;   COLONOSCOPY     COLONOSCOPY WITH PROPOFOL N/A 06/25/2021   Procedure:  COLONOSCOPY WITH PROPOFOL;  Surgeon: Regis Bill, MD;  Location: [facility];  Service: Endoscopy;  Laterality: N/A;   CORRECTION OVERLAPPING TOES Right 01/15/2021   Procedure: CORRECTION OVERLAPPING TOES;  Surgeon: Gwyneth Revels, DPM;  Location: [facility];  Service: Podiatry;  Laterality: Right;   CYSTO WITH HYDRODISTENSION  06/02/2011   Procedure: CYSTOSCOPY/HYDRODISTENSION;  Surgeon: Martina Sinner, MD;  Location: [facility];  Service: Urology;  Laterality: N/A;  catheter placement   HAMMER TOE SURGERY Right 01/15/2021   Procedure: HAMMER TOE CORRECTION;  Surgeon: Gwyneth Revels, DPM;  Location: [facility];  Service: Podiatry;  Laterality: Right;   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   JOINT REPLACEMENT     shoulder, left knee   KNEE ARTHROSCOPY     TRANSURETHRAL RESECTION OF PROSTATE  X3   LAST ONE MAY 2012   WEIL OSTEOTOMY Right 01/15/2021   Procedure: WEIL;  Surgeon: Gwyneth Revels, DPM;  Location: [facility];  Service: Podiatry;  Laterality: Right;   Patient Active Problem List   Diagnosis Date Noted   GAD (generalized anxiety disorder) 06/05/2023   Low back pain 08/04/2022   Gastroesophageal reflux disease without esophagitis 07/22/2022  Leg pain 06/19/2022   BMI 36.0-36.9,adult 11/17/2020   Bilateral lower extremity edema 02/03/2020   DDD (degenerative disc disease), lumbosacral 06/22/2018   Lumbar spondylosis 06/22/2018   Pain in limb 10/20/2017   Arthritis 08/02/2017   Insomnia 08/02/2017   OSA (obstructive sleep apnea) 08/02/2017   BNC (bladder neck contracture) 07/31/2017   Frontal lobe deficit 11/22/2016   Essential hypertension, benign 05/21/2015   Arthritis of left knee 05/21/2015   Interstitial cystitis 07/04/2011   Disorder of skin and subcutaneous tissue 04/04/2011   Hyperlipidemia 04/04/2011   Benign prostatic hyperplasia with urinary obstruction 01/17/2011   Scoliosis 01/17/2011    ONSET DATE: 2022  REFERRING DIAG:   Diagnosis  R26.89 (ICD-10-CM) - Imbalance  M79.604 (ICD-10-CM) - Right leg pain  Z86.718 (ICD-10-CM) - History of DVT of lower extremity    THERAPY DIAG:  Muscle weakness (generalized)  Unsteadiness on feet  Difficulty in walking, not elsewhere classified  Rationale for Evaluation and Treatment: Rehabilitation  SUBJECTIVE:                                                                                                                                                                                             SUBJECTIVE STATEMENT: Pt reports that he is doing well. Less pain today compared to yesterday.     From eval: The patient is a pleasant 73 y/o male presenting to PT for imbalance and chronic BLE pain (R>L) that worsened following DVT in 2022. Pt reports worsening balance over three years. Pt also with extensive surgical hx (see chart), but notes pain with walking since knee replacements: R knee replacement in May 2024, L partial knee replacement in 2016. Since R knee replacement LLE has started to hurt again. Pt concerned about LE weakness as well as balance. He feels as if he is walking on "cushions" most of the time, has difficulty with turns, reports hx of scoliosis that also affects posture/gait. Pt has pins and numbness feeling in bilateral feet, reports this started following bunion and hammer toe surgeries. Pt reports occ numbness in bilateral hands while sleeping. He reports 1 fall in the last 6 months that occurred when trying to reach something while in the bath tub. He reports no head injury. Has had a few near-falls as well, usually while walking. He reports no dizziness. He reports occ neck pain, he has chronic LBP and has upcoming nerve block for it. Pt also with hx of L shoulder replacement and upcoming R shoulder replacements this March.  Pt accompanied by: self  PERTINENT HISTORY: Per chart PMH significant for arthritis, benign bladder mass, complete tear of L  RTC,  depression, AD, heart murmur, LE DVT (2021 per chart, pt reports this was in 2022), hx of recurrent UTIs, HTN, OSA on CPAP, pelvic pain, BLE edema, DDD, lumbar spondylosis, scoliosis, LBP. Please refer to chart for full details  PAIN:  Are you having pain? Yes: NPRS scale: 4/10 Pain location: chronic LBP, bilat LE pain Pain description: pins in feet Aggravating factors:   Relieving factors:    PRECAUTIONS: Fall  RED FLAGS: None   WEIGHT BEARING RESTRICTIONS: No  FALLS: Has patient fallen in last 6 months? Yes. Number of falls 1  LIVING ENVIRONMENT: Lives with: lives alone Lives in: House/apartment Stairs: Noground floor Has following equipment at home: Single point cane, Environmental consultant - 2 wheeled, and Grab bars  PLOF: Independent  PATIENT GOALS: I would like to be able to walk without the fear of falling.  OBJECTIVE:  Note: Objective measures were completed at Evaluation unless otherwise noted.  DIAGNOSTIC FINDINGS:  05/10/2023 MR BRAIN "IMPRESSION: 1. No acute intracranial abnormality or mass. 2. Unchanged mild frontal predominant volume loss and chronic small-vessel disease.     Electronically Signed   By: Orvan Falconer M.D.   On: 05/26/2023 09:07"  COGNITION: Overall cognitive status: Within functional limits for tasks assessed   SENSATION: WFL to light touch, does report pins and needles in feet  COORDINATION: WFL BLE  EDEMA:  Reports some RLE ankle swelling by the end of the day (reports this is a chronic issue)  POSTURE: weight shift right, has scoliosis    LOWER EXTREMITY MMT:   Grossly 4+/5 BLE, greatest weakness in hip flexors hip mm   TRANSFERS: Assistive device utilized: None  Sit to stand: Complete Independence Stand to sit: Complete Independence Chair to chair: Complete Independence GAIT: Gait pattern:  L hip drop, significant weight shift to R in mid-stance Distance walked: , clinic distances Assistive device utilized: None Level of  assistance: SBA   FUNCTIONAL TESTS:  5 times sit to stand: 11.37sec hands-free  - reports some pain in low back with testing : 1.03 m/s : deferred DGI: deferred Screen of gait with scanning (horizontal head turn) mild impairment/increased variability in BOS SLB: able to sustain LLE, unable to hold 1-2 sec RLE   PATIENT SURVEYS:  LEFS 35 FOTO 46 (goal score 52)                                                                                                                              TREATMENT DATE: 07/05/23   Throughout session, PT provided CGA for safety with gait belt in place  NMR: To facilitate reeducation of movement, balance, posture, coordination, and/or proprioception/kinesthetic sense.  Lateral stepping with resistance cable machine 12.5# x 3 rounds ea side, cues for foot positioning   Bosu stance 3 x 30 sec   Airex lateral step up x 10 ea  Activity Description: on airex beam sidestepping to pods ( 2 on floor for step reaction and 2  on elevated surfaces for weight shift and reaching  Activity Setting:  random  Number of Pods:  4 Cycles/Sets:  2 Duration (Time or Hit Count):  20 hits     TA Sit<>stand x10, mild knee pain reported with with this activity 2 x 10 step ups to 6 in step no UE A  PATIENT EDUCATION: Education details: assessment findings, goals, plan Person educated: Patient Education method: Explanation Education comprehension: verbalized understanding  HOME EXERCISE PROGRAM: Access Code: [email or web address] URL: [email or web address] Date: 06/12/2023 Prepared by: Thresa Ross  Exercises - Standing Tandem Balance with Counter Support  - 1 x daily - 7 x weekly - 3 sets - 10 reps - Slow Standing Marching  - 1 x daily - 7 x weekly - 2 sets - 10 reps - Sit to Stand with Arms Crossed  - 1 x daily - 7 x weekly - 2 sets - 12 reps  GOALS: ASSESSMENT: Reviewed with patient: yes  SHORT TERM GOALS: Target date:  07/20/2023    Patient will be independent in home exercise program to improve strength/mobility for better functional independence with ADLs. Baseline: Goal status: INITIAL   LONG TERM GOALS: Target date: 08/31/2023    Patient will increase FOTO score to equal to or greater than   52  to demonstrate statistically significant improvement in mobility and quality of life.  Baseline: 46 Goal status: INITIAL  2.  Patient will complete five times sit to stand test in < 10 seconds indicating an increased LE strength and improved balance. Baseline: 11.37 sec  Goal status: INITIAL  3.  Patient will increase Berg Balance score by > 6 points to demonstrate decreased fall risk during functional activities Baseline: 46/56 Goal status: INITIAL  4.  Patient will increase 10 meter walk test to >1.73m/s as to improve gait speed for better community ambulation. Baseline: 1.03 m/s Goal status: INITIAL   5.  Patient will increase dynamic gait index score to >19/24 as to demonstrate reduced fall risk and improved dynamic gait balance for better safety with community/home ambulation.   Baseline: 17 Goal status: INITIAL  6. Patient will increase lower extremity functional scale to >60/80 to demonstrate improved functional mobility and increased tolerance with ADLs.   Baseline: 35  Goal status: INITIAL   CLINICAL IMPRESSION:  Patient arrived with good motivation for completion of pt activities.  Patient continues with higher-level balance and gait training. Responded well to blaze pod dual task dynamic balance as well as resisted gait with cable system. Pt also able to maintain balance on BOSU this date showing good static balance ability and ankle strategies. Pt will continue to benefit from skilled physical therapy intervention to address impairments, improve QOL, and attain therapy goals.     OBJECTIVE IMPAIRMENTS: Abnormal gait, decreased activity tolerance, decreased balance, decreased  mobility, difficulty walking, decreased strength, increased edema, improper body mechanics, postural dysfunction, and pain.   ACTIVITY LIMITATIONS: lifting, bending, sitting, standing, squatting, stairs, and locomotion level  PARTICIPATION LIMITATIONS: cleaning, laundry, shopping, community activity, and yard work  PERSONAL FACTORS: Age, Fitness, Past/current experiences, Time since onset of injury/illness/exacerbation, and 3+ comorbidities: Per chart PMH significant for arthritis, benign bladder mass, complete tear of L RTC, depression, AD, heart murmur, LE DVT (2021 per chart, pt reports this was in 2022), hx of recurrent UTIs, HTN, OSA on CPAP, pelvic pain, BLE edema, DDD, lumbar spondylosis, scoliosis, LBP.  are also affecting patient's functional outcome.   REHAB POTENTIAL: Good  CLINICAL DECISION MAKING: Evolving/moderate complexity  EVALUATION  COMPLEXITY: Moderate  PLAN:  PT FREQUENCY: 1-2x/week  PT DURATION: 12 weeks  PLANNED INTERVENTIONS: 97164- PT Re-evaluation, 97110-Therapeutic exercises, 97530- Therapeutic activity, [id number]- Neuromuscular re-education, 97535- Self Care, [id number]- Manual therapy, [id number]- Gait training, [phone]- Orthotic Fit/training, [phone]- Canalith repositioning, [id number]- Splinting, [id number]- Electrical stimulation (manual), [id number]- Ultrasound, Patient/Family education, Balance training, Stair training, Taping, Dry Needling, Joint mobilization, Spinal mobilization, Scar mobilization, Vestibular training, Visual/preceptual remediation/compensation, DME instructions, Cryotherapy, and Moist heat  PLAN FOR NEXT SESSION:   Strength and balance interventions, more dynamic than static but static still indicated.    Norman Herrlich, PT 07/05/2023, 11:55 AM

## 2023-07-11 ENCOUNTER — Ambulatory Visit: Payer: Medicare Other

## 2023-07-11 DIAGNOSIS — Z96611 Presence of right artificial shoulder joint: Secondary | ICD-10-CM | POA: Insufficient documentation

## 2023-07-14 ENCOUNTER — Ambulatory Visit: Payer: Medicare Other | Admitting: Physical Therapy

## 2023-07-14 DIAGNOSIS — M79604 Pain in right leg: Secondary | ICD-10-CM

## 2023-07-14 DIAGNOSIS — M6281 Muscle weakness (generalized): Secondary | ICD-10-CM

## 2023-07-14 DIAGNOSIS — R262 Difficulty in walking, not elsewhere classified: Secondary | ICD-10-CM

## 2023-07-14 DIAGNOSIS — M79605 Pain in left leg: Secondary | ICD-10-CM

## 2023-07-14 DIAGNOSIS — M5459 Other low back pain: Secondary | ICD-10-CM

## 2023-07-14 DIAGNOSIS — R2681 Unsteadiness on feet: Secondary | ICD-10-CM

## 2023-07-14 NOTE — Therapy (Signed)
 OUTPATIENT PHYSICAL THERAPY NEURO TREATMENT   Patient Name: Vincent Cole MRN: [id number] DOB:[birth year], 73 y.o., male Today's Date: 07/14/2023   PCP: Margaretann Loveless, MD  REFERRING PROVIDER: Bridgette Habermann, PA-C   END OF SESSION:  PT End of Session - 07/14/23 1015     Visit Number 9    Number of Visits 25    Date for PT Re-Evaluation 08/31/23    Progress Note Due on Visit 10    PT Start Time 1016    PT Stop Time 1100    PT Time Calculation (min) 44 min    Equipment Utilized During Treatment Gait belt    Activity Tolerance Patient tolerated treatment well    Behavior During Therapy [facility] for tasks assessed/performed               Past Medical History:  Diagnosis Date   Abdominal pain 01/17/2011   Formatting of this note might be different from the original.  Note: Unchanged - chronic  suprapubic/lower abdominal area since TURP in 2005; urology has now diagnosed, after extensive evaluation, interstitial cystitis; has also had repeat TURP in June 2010, and a surgery on the neck of the bladder in July of 2011     Actinic keratosis    Arthritis    Benign bladder mass    Complete tear of left rotator cuff 04/19/2017   Complication of anesthesia    PONV x 1 in 2010   Depression    Frequency of urination    GERD (gastroesophageal reflux disease) no meds   Heart murmur    was told many years ago   History of DVT of lower extremity 02/03/2020   History of recurrent UTIs    Hypertension    IC (interstitial cystitis)    Nocturia    OSA on CPAP nightly   Pelvic pain in male    PONV (postoperative nausea and vomiting)    Simple renal cyst    Urgency of urination    Urinary hesitancy    Past Surgical History:  Procedure Laterality Date   BUNIONECTOMY Right 01/15/2021   Procedure: Flavia Shipper;  Surgeon: Gwyneth Revels, DPM;  Location: [facility];  Service: Podiatry;  Laterality: Right;   COLONOSCOPY     COLONOSCOPY WITH PROPOFOL N/A 06/25/2021   Procedure:  COLONOSCOPY WITH PROPOFOL;  Surgeon: Regis Bill, MD;  Location: [facility];  Service: Endoscopy;  Laterality: N/A;   CORRECTION OVERLAPPING TOES Right 01/15/2021   Procedure: CORRECTION OVERLAPPING TOES;  Surgeon: Gwyneth Revels, DPM;  Location: [facility];  Service: Podiatry;  Laterality: Right;   CYSTO WITH HYDRODISTENSION  06/02/2011   Procedure: CYSTOSCOPY/HYDRODISTENSION;  Surgeon: Martina Sinner, MD;  Location: [facility];  Service: Urology;  Laterality: N/A;  catheter placement   HAMMER TOE SURGERY Right 01/15/2021   Procedure: HAMMER TOE CORRECTION;  Surgeon: Gwyneth Revels, DPM;  Location: [facility];  Service: Podiatry;  Laterality: Right;   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   JOINT REPLACEMENT     shoulder, left knee   KNEE ARTHROSCOPY     TRANSURETHRAL RESECTION OF PROSTATE  X3   LAST ONE MAY 2012   WEIL OSTEOTOMY Right 01/15/2021   Procedure: WEIL;  Surgeon: Gwyneth Revels, DPM;  Location: [facility];  Service: Podiatry;  Laterality: Right;   Patient Active Problem List   Diagnosis Date Noted   GAD (generalized anxiety disorder) 06/05/2023   Low back pain 08/04/2022   Gastroesophageal reflux disease without esophagitis 07/22/2022  Leg pain 06/19/2022   BMI 36.0-36.9,adult 11/17/2020   Bilateral lower extremity edema 02/03/2020   DDD (degenerative disc disease), lumbosacral 06/22/2018   Lumbar spondylosis 06/22/2018   Pain in limb 10/20/2017   Arthritis 08/02/2017   Insomnia 08/02/2017   OSA (obstructive sleep apnea) 08/02/2017   BNC (bladder neck contracture) 07/31/2017   Frontal lobe deficit 11/22/2016   Essential hypertension, benign 05/21/2015   Arthritis of left knee 05/21/2015   Interstitial cystitis 07/04/2011   Disorder of skin and subcutaneous tissue 04/04/2011   Hyperlipidemia 04/04/2011   Benign prostatic hyperplasia with urinary obstruction 01/17/2011   Scoliosis 01/17/2011    ONSET DATE: 2022  REFERRING DIAG:   Diagnosis  R26.89 (ICD-10-CM) - Imbalance  M79.604 (ICD-10-CM) - Right leg pain  Z86.718 (ICD-10-CM) - History of DVT of lower extremity    THERAPY DIAG:  Muscle weakness (generalized)  Unsteadiness on feet  Difficulty in walking, not elsewhere classified  Pain in right leg  Other low back pain  Pain in left leg  Rationale for Evaluation and Treatment: Rehabilitation  SUBJECTIVE:                                                                                                                                                                                             SUBJECTIVE STATEMENT:  Pt reports that is doing okay, but states that he is feeling pain down the LLE from hip to just distal to the knee. Pt reports pain is there but he can "do everything"   Questions about received PT order for shoulder.     From eval: The patient is a pleasant 73 y/o male presenting to PT for imbalance and chronic BLE pain (R>L) that worsened following DVT in 2022. Pt reports worsening balance over three years. Pt also with extensive surgical hx (see chart), but notes pain with walking since knee replacements: R knee replacement in May 2024, L partial knee replacement in 2016. Since R knee replacement LLE has started to hurt again. Pt concerned about LE weakness as well as balance. He feels as if he is walking on "cushions" most of the time, has difficulty with turns, reports hx of scoliosis that also affects posture/gait. Pt has pins and numbness feeling in bilateral feet, reports this started following bunion and hammer toe surgeries. Pt reports occ numbness in bilateral hands while sleeping. He reports 1 fall in the last 6 months that occurred when trying to reach something while in the bath tub. He reports no head injury. Has had a few near-falls as well, usually while walking. He reports no dizziness. He reports occ neck pain, he  has chronic LBP and has upcoming nerve block for it. Pt also with hx  of L shoulder replacement and upcoming R shoulder replacements this March.  Pt accompanied by: self  PERTINENT HISTORY: Per chart PMH significant for arthritis, benign bladder mass, complete tear of L RTC, depression, AD, heart murmur, LE DVT (2021 per chart, pt reports this was in 2022), hx of recurrent UTIs, HTN, OSA on CPAP, pelvic pain, BLE edema, DDD, lumbar spondylosis, scoliosis, LBP. Please refer to chart for full details  PAIN:  Are you having pain? Yes: NPRS scale: 4/10 Pain location: chronic LBP, bilat LE pain Pain description: pins in feet Aggravating factors:   Relieving factors:    PRECAUTIONS: Fall  RED FLAGS: None   WEIGHT BEARING RESTRICTIONS: No  FALLS: Has patient fallen in last 6 months? Yes. Number of falls 1  LIVING ENVIRONMENT: Lives with: lives alone Lives in: House/apartment Stairs: Noground floor Has following equipment at home: Single point cane, Environmental consultant - 2 wheeled, and Grab bars  PLOF: Independent  PATIENT GOALS: I would like to be able to walk without the fear of falling.  OBJECTIVE:  Note: Objective measures were completed at Evaluation unless otherwise noted.  DIAGNOSTIC FINDINGS:  05/10/2023 MR BRAIN "IMPRESSION: 1. No acute intracranial abnormality or mass. 2. Unchanged mild frontal predominant volume loss and chronic small-vessel disease.     Electronically Signed   By: Orvan Falconer M.D.   On: 05/26/2023 09:07"  COGNITION: Overall cognitive status: Within functional limits for tasks assessed   SENSATION: WFL to light touch, does report pins and needles in feet  COORDINATION: WFL BLE  EDEMA:  Reports some RLE ankle swelling by the end of the day (reports this is a chronic issue)  POSTURE: weight shift right, has scoliosis    LOWER EXTREMITY MMT:   Grossly 4+/5 BLE, greatest weakness in hip flexors hip mm   TRANSFERS: Assistive device utilized: None  Sit to stand: Complete Independence Stand to sit: Complete  Independence Chair to chair: Complete Independence GAIT: Gait pattern:  L hip drop, significant weight shift to R in mid-stance Distance walked: , clinic distances Assistive device utilized: None Level of assistance: SBA   FUNCTIONAL TESTS:  5 times sit to stand: 11.37sec hands-free  - reports some pain in low back with testing : 1.03 m/s : deferred DGI: deferred Screen of gait with scanning (horizontal head turn) mild impairment/increased variability in BOS SLB: able to sustain LLE, unable to hold 1-2 sec RLE   PATIENT SURVEYS:  LEFS 35 FOTO 46 (goal score 52)                                                                                                                              TREATMENT DATE: 07/14/23  Sit<>stand x 10 without UE support - no report of knee pain on this day.   Throughout session, PT provided CGA for safety with gait belt in place  NMR: To  facilitate reeducation of movement, balance, posture, coordination, and/or proprioception/kinesthetic sense.  Standing on airex pad:  Static stance 2 x 30 sec  Bal toss off wall x 10  1 foot on pad, 1 foot on box: ball raise over head x 12 bil  Side stepping from pad to box with lateral foot tap on cone from pad x 8 bil  Step up from pad to pad on 6 inch box x 5 bil  Partial Lateral lunge from pad to pad on 6 inch step x 12 bil   Kore balance:  Dynamic control circle x 3, score 1230, 1245 and 1115.  Tux racer 2 x 2. [place]  80sec/15 fish  81sec/23 fish  59sec/16 fish  46 sec/18 fish.  Min assist for improved use of ankle strategy to contrl COM, noted to have constant use of hip hinge without assist to control COM, mild improvement by the end of 4th bout.   Gait without resistance x 367ft. Mild pain noted in the lateral LLE with >162ft.     PATIENT EDUCATION: Education details: assessment findings, goals, plan Pt educated throughout session about proper posture and technique with exercises.  Improved exercise technique, movement at target joints, use of target muscles after min to mod verbal, visual, tactile cues.  Person educated: Patient Education method: Explanation Education comprehension: verbalized understanding  HOME EXERCISE PROGRAM: Access Code: [email or web address] URL: [email or web address] Date: 06/12/2023 Prepared by: Thresa Ross  Exercises - Standing Tandem Balance with Counter Support  - 1 x daily - 7 x weekly - 3 sets - 10 reps - Slow Standing Marching  - 1 x daily - 7 x weekly - 2 sets - 10 reps - Sit to Stand with Arms Crossed  - 1 x daily - 7 x weekly - 2 sets - 12 reps  GOALS: ASSESSMENT: Reviewed with patient: yes  SHORT TERM GOALS: Target date: 07/20/2023    Patient will be independent in home exercise program to improve strength/mobility for better functional independence with ADLs. Baseline: Goal status: INITIAL   LONG TERM GOALS: Target date: 08/31/2023    Patient will increase FOTO score to equal to or greater than   52  to demonstrate statistically significant improvement in mobility and quality of life.  Baseline: 46 Goal status: INITIAL  2.  Patient will complete five times sit to stand test in < 10 seconds indicating an increased LE strength and improved balance. Baseline: 11.37 sec  Goal status: INITIAL  3.  Patient will increase Berg Balance score by > 6 points to demonstrate decreased fall risk during functional activities Baseline: 46/56 Goal status: INITIAL  4.  Patient will increase 10 meter walk test to >1.41m/s as to improve gait speed for better community ambulation. Baseline: 1.03 m/s Goal status: INITIAL   5.  Patient will increase dynamic gait index score to >19/24 as to demonstrate reduced fall risk and improved dynamic gait balance for better safety with community/home ambulation.   Baseline: 17 Goal status: INITIAL  6. Patient will increase lower extremity functional scale to >60/80 to demonstrate  improved functional mobility and increased tolerance with ADLs.   Baseline: 35  Goal status: INITIAL   CLINICAL IMPRESSION:  Patient arrived with good motivation for completion of pt activities.  PT treatment focused on dynamic balance training with varied surfaces. Tolerated well, but struggled with use of ankle strategy for COM control on Kore balance, reuqiring min assist to improve pelvic position and reduce hip hinge for AP weight shift. Pt  will continue to benefit from skilled physical therapy intervention to address impairments, improve QOL, and attain therapy goals.     OBJECTIVE IMPAIRMENTS: Abnormal gait, decreased activity tolerance, decreased balance, decreased mobility, difficulty walking, decreased strength, increased edema, improper body mechanics, postural dysfunction, and pain.   ACTIVITY LIMITATIONS: lifting, bending, sitting, standing, squatting, stairs, and locomotion level  PARTICIPATION LIMITATIONS: cleaning, laundry, shopping, community activity, and yard work  PERSONAL FACTORS: Age, Fitness, Past/current experiences, Time since onset of injury/illness/exacerbation, and 3+ comorbidities: Per chart PMH significant for arthritis, benign bladder mass, complete tear of L RTC, depression, AD, heart murmur, LE DVT (2021 per chart, pt reports this was in 2022), hx of recurrent UTIs, HTN, OSA on CPAP, pelvic pain, BLE edema, DDD, lumbar spondylosis, scoliosis, LBP.  are also affecting patient's functional outcome.   REHAB POTENTIAL: Good  CLINICAL DECISION MAKING: Evolving/moderate complexity  EVALUATION COMPLEXITY: Moderate  PLAN:  PT FREQUENCY: 1-2x/week  PT DURATION: 12 weeks  PLANNED INTERVENTIONS: 97164- PT Re-evaluation, 97110-Therapeutic exercises, 97530- Therapeutic activity, [id number]- Neuromuscular re-education, 97535- Self Care, 16109- Manual therapy, [id number]- Gait training, [phone]- Orthotic Fit/training, [phone]- Canalith repositioning, [id number]- Splinting, [id number]-  Electrical stimulation (manual), [id number]- Ultrasound, Patient/Family education, Balance training, Stair training, Taping, Dry Needling, Joint mobilization, Spinal mobilization, Scar mobilization, Vestibular training, Visual/preceptual remediation/compensation, DME instructions, Cryotherapy, and Moist heat  PLAN FOR NEXT SESSION:   Strength and balance interventions, more dynamic than static but static still indicated.    Golden Pop, PT 07/14/2023, 10:15 AM

## 2023-07-17 ENCOUNTER — Ambulatory Visit (INDEPENDENT_AMBULATORY_CARE_PROVIDER_SITE_OTHER): Payer: Medicare Other | Admitting: Family

## 2023-07-17 ENCOUNTER — Encounter: Payer: Self-pay | Admitting: Dermatology

## 2023-07-17 ENCOUNTER — Encounter: Payer: Self-pay | Admitting: Family

## 2023-07-17 DIAGNOSIS — F411 Generalized anxiety disorder: Secondary | ICD-10-CM | POA: Diagnosis not present

## 2023-07-17 DIAGNOSIS — R7303 Prediabetes: Secondary | ICD-10-CM

## 2023-07-17 DIAGNOSIS — I10 Essential (primary) hypertension: Secondary | ICD-10-CM

## 2023-07-17 DIAGNOSIS — F330 Major depressive disorder, recurrent, mild: Secondary | ICD-10-CM

## 2023-07-17 MED ORDER — DULOXETINE HCL 60 MG PO CPEP: 60.0000 mg | ORAL_CAPSULE | Freq: Every day | ORAL | 1 refills | Status: DC

## 2023-07-17 NOTE — Progress Notes (Signed)
 Established Patient Office Visit  Subjective:  Patient ID: Vincent Cole, male    DOB: Oct 14, 1950  Age: 73 y.o. MRN: [id number]  Chief Complaint  Patient presents with   Follow-up    6 week follow up    Patient is here today for his 6 week follow up.  He has been feeling well and the issues he had been having previously have improved.  He has been having an increase in his anxiety levels and says that he would like to try something else to help him.  He does not wish to stop any of his current meds, but asks if there is anything else we can add to the current meds to help.     No other concerns at this time.   Past Medical History:  Diagnosis Date   Abdominal pain 01/17/2011   Formatting of this note might be different from the original.  Note: Unchanged - chronic  suprapubic/lower abdominal area since TURP in 2005; urology has now diagnosed, after extensive evaluation, interstitial cystitis; has also had repeat TURP in June 2010, and a surgery on the neck of the bladder in July of 2011     Actinic keratosis    Arthritis    Benign bladder mass    Complete tear of left rotator cuff 04/19/2017   Complication of anesthesia    PONV x 1 in 2010   Depression    Frequency of urination    GERD (gastroesophageal reflux disease) no meds   Heart murmur    was told many years ago   History of DVT of lower extremity 02/03/2020   History of recurrent UTIs    Hypertension    IC (interstitial cystitis)    Nocturia    OSA on CPAP nightly   Pelvic pain in male    PONV (postoperative nausea and vomiting)    Simple renal cyst    Urgency of urination    Urinary hesitancy     Past Surgical History:  Procedure Laterality Date   BUNIONECTOMY Right 01/15/2021   Procedure: Berta Brittle;  Surgeon: Anell Baptist, DPM;  Location: [facility];  Service: Podiatry;  Laterality: Right;   COLONOSCOPY     COLONOSCOPY WITH PROPOFOL  N/A 06/25/2021   Procedure: COLONOSCOPY WITH PROPOFOL ;   Surgeon: Shane Darling, MD;  Location: [facility];  Service: Endoscopy;  Laterality: N/A;   CORRECTION OVERLAPPING TOES Right 01/15/2021   Procedure: CORRECTION OVERLAPPING TOES;  Surgeon: Anell Baptist, DPM;  Location: [facility];  Service: Podiatry;  Laterality: Right;   CYSTO WITH HYDRODISTENSION  06/02/2011   Procedure: CYSTOSCOPY/HYDRODISTENSION;  Surgeon: Devorah Fonder, MD;  Location[facility]  Service: Urology;  Laterality: N/A;  catheter placement   HAMMER TOE SURGERY Right 01/15/2021   Procedure: HAMMER TOE CORRECTION;  Surgeon: Anell Baptist, DPM;  Location: [facility];  Service: Podiatry;  Laterality: Right;   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   JOINT REPLACEMENT     shoulder, left knee   KNEE ARTHROSCOPY     TRANSURETHRAL RESECTION OF PROSTATE  X3   LAST ONE MAY 2012   WEIL OSTEOTOMY Right 01/15/2021   Procedure: WEIL;  Surgeon: Anell Baptist, DPM;  Location: [facility];  Service: Podiatry;  Laterality: Right;    Social History   Socioeconomic History   Marital status: Married    Spouse name: Not on file   Number of children: 1   Years of education: Not on file   Highest education  level: Not on file  Occupational History   Not on file  Tobacco Use   Smoking status: Never   Smokeless tobacco: Never  Vaping Use   Vaping status: Never Used  Substance and Sexual Activity   Alcohol use: Yes    Comment: occ   Drug use: No   Sexual activity: Not on file  Other Topics Concern   Not on file  Social History Narrative   Not on file   Social Drivers of Health   Financial Resource Strain: Not on file  Food Insecurity: No Food Insecurity (05/22/2023)   Hunger Vital Sign    Worried About Running Out of Food in the Last Year: Never true    Ran Out of Food in the Last Year: Never true  Transportation Needs: No Transportation Needs (05/22/2023)   PRAPARE - Administrator, Civil Service (Medical): No    Lack of  Transportation (Non-Medical): No  Physical Activity: Not on file  Stress: No Stress Concern Present (05/22/2023)   [employer] of Occupational Health - Occupational Stress Questionnaire    Feeling of Stress : Only a little  Social Connections: Not on file  Intimate Partner Violence: Not At Risk (05/22/2023)   Humiliation, Afraid, Rape, and Kick questionnaire    Fear of Current or Ex-Partner: No    Emotionally Abused: No    Physically Abused: No    Sexually Abused: No    Family History  Problem Relation Age of Onset   Heart disease Mother     No Known Allergies  Review of Systems  All other systems reviewed and are negative.      Objective:   BP (!) 146/86   Pulse 80   Ht 5' 4.5" (1.638 m)   Wt 196 lb 3.2 oz (89 kg)   SpO2 95%   BMI 33.16 kg/m   Vitals:   07/17/23 1031  BP: (!) 146/86  Pulse: 80  Height: 5' 4.5" (1.638 m)  Weight: 196 lb 3.2 oz (89 kg)  SpO2: 95%  BMI (Calculated): 33.17    Physical Exam Vitals and nursing note reviewed.  Constitutional:      Appearance: Normal appearance. He is normal weight.  Eyes:     Pupils: Pupils are equal, round, and reactive to light.  Cardiovascular:     Rate and Rhythm: Normal rate and regular rhythm.     Pulses: Normal pulses.     Heart sounds: Normal heart sounds.  Pulmonary:     Effort: Pulmonary effort is normal.     Breath sounds: Normal breath sounds.  Neurological:     Mental Status: He is alert.  Psychiatric:        Mood and Affect: Mood normal.        Behavior: Behavior normal.        Thought Content: Thought content normal.        Judgment: Judgment normal.      No results found for any visits on 07/17/23.  No results found for this or any previous visit (from the past 2160 hours).     Assessment & Plan:   Problem List Items Addressed This Visit       Other   GAD (generalized anxiety disorder)   Sending refills for his duloxetine . Patient given samples of rexulti to try.   Will follow up in 2 weeks to see if this helps.      Relevant Medications   DULoxetine  (CYMBALTA ) 60 MG capsule  Depression   Sending refills for his duloxetine . Patient given samples of rexulti to try.  Will follow up in 2 weeks to see if this helps.      Relevant Medications   DULoxetine  (CYMBALTA ) 60 MG capsule    Return in about 2 weeks (around 07/31/2023) for F/U.   Total time spent: 20 minutes  Trenda Frisk, FNP  07/17/2023   This document may have been prepared by [facility] Voice Recognition software and as such may include unintentional dictation errors.

## 2023-07-18 ENCOUNTER — Ambulatory Visit: Payer: Medicare Other | Admitting: Physical Therapy

## 2023-07-18 DIAGNOSIS — M6281 Muscle weakness (generalized): Secondary | ICD-10-CM | POA: Diagnosis not present

## 2023-07-18 DIAGNOSIS — R262 Difficulty in walking, not elsewhere classified: Secondary | ICD-10-CM

## 2023-07-18 DIAGNOSIS — R2681 Unsteadiness on feet: Secondary | ICD-10-CM

## 2023-07-18 DIAGNOSIS — M79604 Pain in right leg: Secondary | ICD-10-CM

## 2023-07-18 NOTE — Therapy (Signed)
 OUTPATIENT PHYSICAL THERAPY NEURO TREATMENT/ Physical Therapy Progress Note   Dates of reporting period  06/08/23   to   07/18/23    Patient Name: Blayton Huttner MRN: [id number] DOB:May 17, 1951, 73 y.o., male Today's Date: 07/18/2023   PCP: Margaretann Loveless, MD  REFERRING PROVIDER: Bridgette Habermann, PA-C   END OF SESSION:  PT End of Session - 07/18/23 1446     Visit Number 10    Number of Visits 25    Date for PT Re-Evaluation 08/31/23    Progress Note Due on Visit 10    PT Start Time 1448    PT Stop Time 1526    PT Time Calculation (min) 38 min    Equipment Utilized During Treatment Gait belt    Activity Tolerance Patient tolerated treatment well    Behavior During Therapy [facility] for tasks assessed/performed               Past Medical History:  Diagnosis Date   Abdominal pain 01/17/2011   Formatting of this note might be different from the original.  Note: Unchanged - chronic  suprapubic/lower abdominal area since TURP in 2005; urology has now diagnosed, after extensive evaluation, interstitial cystitis; has also had repeat TURP in June 2010, and a surgery on the neck of the bladder in July of 2011     Actinic keratosis    Arthritis    Benign bladder mass    Complete tear of left rotator cuff 04/19/2017   Complication of anesthesia    PONV x 1 in 2010   Depression    Frequency of urination    GERD (gastroesophageal reflux disease) no meds   Heart murmur    was told many years ago   History of DVT of lower extremity 02/03/2020   History of recurrent UTIs    Hypertension    IC (interstitial cystitis)    Nocturia    OSA on CPAP nightly   Pelvic pain in male    PONV (postoperative nausea and vomiting)    Simple renal cyst    Urgency of urination    Urinary hesitancy    Past Surgical History:  Procedure Laterality Date   BUNIONECTOMY Right 01/15/2021   Procedure: Flavia Shipper;  Surgeon: Gwyneth Revels, DPM;  Location: [facility];  Service: Podiatry;   Laterality: Right;   COLONOSCOPY     COLONOSCOPY WITH PROPOFOL N/A 06/25/2021   Procedure: COLONOSCOPY WITH PROPOFOL;  Surgeon: Regis Bill, MD;  Location: [facility];  Service: Endoscopy;  Laterality: N/A;   CORRECTION OVERLAPPING TOES Right 01/15/2021   Procedure: CORRECTION OVERLAPPING TOES;  Surgeon: Gwyneth Revels, DPM;  Location: [facility];  Service: Podiatry;  Laterality: Right;   CYSTO WITH HYDRODISTENSION  06/02/2011   Procedure: CYSTOSCOPY/HYDRODISTENSION;  Surgeon: Martina Sinner, MD;  Location: [facility];  Service: Urology;  Laterality: N/A;  catheter placement   HAMMER TOE SURGERY Right 01/15/2021   Procedure: HAMMER TOE CORRECTION;  Surgeon: Gwyneth Revels, DPM;  Location: [facility];  Service: Podiatry;  Laterality: Right;   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   JOINT REPLACEMENT     shoulder, left knee   KNEE ARTHROSCOPY     TRANSURETHRAL RESECTION OF PROSTATE  X3   LAST ONE MAY 2012   WEIL OSTEOTOMY Right 01/15/2021   Procedure: WEIL;  Surgeon: Gwyneth Revels, DPM;  Location: [facility];  Service: Podiatry;  Laterality: Right;   Patient Active Problem List   Diagnosis Date Noted  GAD (generalized anxiety disorder) 06/05/2023   Low back pain 08/04/2022   Gastroesophageal reflux disease without esophagitis 07/22/2022   Leg pain 06/19/2022   BMI 36.0-36.9,adult 11/17/2020   Bilateral lower extremity edema 02/03/2020   DDD (degenerative disc disease), lumbosacral 06/22/2018   Lumbar spondylosis 06/22/2018   Pain in limb 10/20/2017   Arthritis 08/02/2017   Insomnia 08/02/2017   OSA (obstructive sleep apnea) 08/02/2017   BNC (bladder neck contracture) 07/31/2017   Frontal lobe deficit 11/22/2016   Essential hypertension, benign 05/21/2015   Arthritis of left knee 05/21/2015   Interstitial cystitis 07/04/2011   Disorder of skin and subcutaneous tissue 04/04/2011   Hyperlipidemia 04/04/2011   Benign prostatic hyperplasia with urinary  obstruction 01/17/2011   Scoliosis 01/17/2011    ONSET DATE: 2022  REFERRING DIAG:  Diagnosis  R26.89 (ICD-10-CM) - Imbalance  M79.604 (ICD-10-CM) - Right leg pain  Z86.718 (ICD-10-CM) - History of DVT of lower extremity    THERAPY DIAG:  No diagnosis found.  Rationale for Evaluation and Treatment: Rehabilitation  SUBJECTIVE:                                                                                                                                                                                             SUBJECTIVE STATEMENT:  Pt reports that is doing okay, but states that he is feeling pain down the LLE from hip to just distal to the knee. Pt reports pain is there but he can "do everything"   Questions about received PT order for shoulder.     From eval: The patient is a pleasant 73 y/o male presenting to PT for imbalance and chronic BLE pain (R>L) that worsened following DVT in 2022. Pt reports worsening balance over three years. Pt also with extensive surgical hx (see chart), but notes pain with walking since knee replacements: R knee replacement in May 2024, L partial knee replacement in 2016. Since R knee replacement LLE has started to hurt again. Pt concerned about LE weakness as well as balance. He feels as if he is walking on "cushions" most of the time, has difficulty with turns, reports hx of scoliosis that also affects posture/gait. Pt has pins and numbness feeling in bilateral feet, reports this started following bunion and hammer toe surgeries. Pt reports occ numbness in bilateral hands while sleeping. He reports 1 fall in the last 6 months that occurred when trying to reach something while in the bath tub. He reports no head injury. Has had a few near-falls as well, usually while walking. He reports no dizziness. He reports occ neck pain, he has chronic LBP and has  upcoming nerve block for it. Pt also with hx of L shoulder replacement and upcoming R shoulder  replacements this March.  Pt accompanied by: self  PERTINENT HISTORY: Per chart PMH significant for arthritis, benign bladder mass, complete tear of L RTC, depression, AD, heart murmur, LE DVT (2021 per chart, pt reports this was in 2022), hx of recurrent UTIs, HTN, OSA on CPAP, pelvic pain, BLE edema, DDD, lumbar spondylosis, scoliosis, LBP. Please refer to chart for full details  PAIN:  Are you having pain? Yes: NPRS scale: 4/10 Pain location: chronic LBP, bilat LE pain Pain description: pins in feet Aggravating factors:   Relieving factors:    PRECAUTIONS: Fall  RED FLAGS: None   WEIGHT BEARING RESTRICTIONS: No  FALLS: Has patient fallen in last 6 months? Yes. Number of falls 1  LIVING ENVIRONMENT: Lives with: lives alone Lives in: House/apartment Stairs: Noground floor Has following equipment at home: Single point cane, Environmental consultant - 2 wheeled, and Grab bars  PLOF: Independent  PATIENT GOALS: I would like to be able to walk without the fear of falling.  OBJECTIVE:  Note: Objective measures were completed at Evaluation unless otherwise noted.  DIAGNOSTIC FINDINGS:  05/10/2023 MR BRAIN "IMPRESSION: 1. No acute intracranial abnormality or mass. 2. Unchanged mild frontal predominant volume loss and chronic small-vessel disease.     Electronically Signed   By: Orvan Falconer M.D.   On: 05/26/2023 09:07"  COGNITION: Overall cognitive status: Within functional limits for tasks assessed   SENSATION: WFL to light touch, does report pins and needles in feet  COORDINATION: WFL BLE  EDEMA:  Reports some RLE ankle swelling by the end of the day (reports this is a chronic issue)  POSTURE: weight shift right, has scoliosis    LOWER EXTREMITY MMT:   Grossly 4+/5 BLE, greatest weakness in hip flexors hip mm   TRANSFERS: Assistive device utilized: None  Sit to stand: Complete Independence Stand to sit: Complete Independence Chair to chair: Complete  Independence GAIT: Gait pattern:  L hip drop, significant weight shift to R in mid-stance Distance walked: , clinic distances Assistive device utilized: None Level of assistance: SBA   FUNCTIONAL TESTS:  5 times sit to stand: 11.37sec hands-free  - reports some pain in low back with testing : 1.03 m/s : deferred DGI: deferred Screen of gait with scanning (horizontal head turn) mild impairment/increased variability in BOS SLB: able to sustain LLE, unable to hold 1-2 sec RLE   PATIENT SURVEYS:  LEFS 35 FOTO 46 (goal score 52)                                                                                                                              TREATMENT DATE: 07/18/23  PHYSICAL PERFORMANCE:   Physical therapy treatment session today consisted of completing assessment of goals and administration of testing as demonstrated and documented in flow sheet, treatment, and goals section of this note. Addition treatments may be  found below.     [facility] PT Assessment - 07/18/23 0001       Berg Balance Test   Sit to Stand Able to stand without using hands and stabilize independently    Standing Unsupported Able to stand safely 2 minutes    Sitting with Back Unsupported but Feet Supported on Floor or Stool Able to sit safely and securely 2 minutes    Stand to Sit Sits safely with minimal use of hands    Transfers Able to transfer safely, minor use of hands    Standing Unsupported with Eyes Closed Able to stand 10 seconds safely    Standing Unsupported with Feet Together Able to place feet together independently and stand 1 minute safely    From Standing, Reach Forward with Outstretched Arm Can reach confidently >25 cm (10")    From Standing Position, Pick up Object from Floor Able to pick up shoe safely and easily    From Standing Position, Turn to Look Behind Over each Shoulder Looks behind from both sides and weight shifts well    Turn 360 Degrees Able to turn 360  degrees safely in 4 seconds or less    Standing Unsupported, Alternately Place Feet on Step/Stool Able to stand independently and safely and complete 8 steps in 20 seconds    Standing Unsupported, One Foot in Front Able to place foot tandem independently and hold 30 seconds    Standing on One Leg Able to lift leg independently and hold > 10 seconds    Total Score 56      Dynamic Gait Index   Level Surface Normal    Change in Gait Speed Normal    Gait with Horizontal Head Turns Mild Impairment    Gait with Vertical Head Turns Mild Impairment    Gait and Pivot Turn Normal    Step Over Obstacle Moderate Impairment    Step Around Obstacles Normal    Steps Mild Impairment    Total Score 19            PT instructed pt in DGI. See below for results. Demonstrates increased fall risk with score of 19/24. (<19 indicates increased fall risk)  Patient demonstrates increased fall risk as noted by score of  56 /56 on Berg Balance Scale.  (<36= high risk for falls, close to 100%; 37-45 significant >80%; 46-51 moderate >50%; 52-55 lower >25%) 10 Meter Walk Test: Patient instructed to walk 14 meters (32.8 ft) as quickly and as safely as possible at their normal speed  Time measured from 2 meter mark to 12 meter mark to accommodate ramp-up and ramp-down.  Normal speed 1: 1.2  m/s  Cut off scores: <0.4 m/s = household Ambulator, 0.4-0.8 m/s = limited community Ambulator, >0.8 m/s = community Ambulator, >1.2 m/s = crossing a street, <1.0 = increased fall risk MCID 0.05 m/s (small), 0.13 m/s (moderate) (ANPTA Core Set of Outcome Measures for Adults with Neurologic Conditions, 2018)   TE:  Leg press  1 x 15 at 40 #, cues for eccentric control  1 x 15 @ 55#  2 x 12 @ 70#   NMR:  On airex 3/4 romberg stance x 10 lateral and vertical head turns, repeat with opposite LE post, increased difficulty with LLE post.   PATIENT EDUCATION: Education details: assessment findings, goals, plan Pt educated  throughout session about proper posture and technique with exercises. Improved exercise technique, movement at target joints, use of target muscles after min to mod verbal,  visual, tactile cues.  Person educated: Patient Education method: Explanation Education comprehension: verbalized understanding  HOME EXERCISE PROGRAM: Access Code: [email or web address] URL: [email or web address] Date: 06/12/2023 Prepared by: Thresa Ross  Exercises - Standing Tandem Balance with Counter Support  - 1 x daily - 7 x weekly - 3 sets - 10 reps - Slow Standing Marching  - 1 x daily - 7 x weekly - 2 sets - 10 reps - Sit to Stand with Arms Crossed  - 1 x daily - 7 x weekly - 2 sets - 12 reps  GOALS: ASSESSMENT: Reviewed with patient: yes  SHORT TERM GOALS: Target date: 07/20/2023    Patient will be independent in home exercise program to improve strength/mobility for better functional independence with ADLs. Baseline:2/25: completing regularly  Goal status: MET   LONG TERM GOALS: Target date: 08/31/2023    Patient will increase FOTO score to equal to or greater than   52  to demonstrate statistically significant improvement in mobility and quality of life.  Baseline: 46, 2/25: clinic discontinuing use of FOTO Goal status: NOT MET  2.  Patient will complete five times sit to stand test in < 10 seconds indicating an increased LE strength and improved balance. Baseline: 11.37 sec 2/25:11.3 sec  Goal status: IN PROGRESS  3.  Patient will increase Berg Balance score by > 6 points to demonstrate decreased fall risk during functional activities Baseline: 46/56 2/25:56 Goal status: MET  4.  Patient will increase 10 meter walk test to >1.42m/s as to improve gait speed for better community ambulation. Baseline: 1.03 m/s 2/25: 1.22 m/s Goal status: INITIAL   5.  Patient will increase dynamic gait index score to >19/24 as to demonstrate reduced fall risk and improved dynamic gait balance for  better safety with community/home ambulation.   Baseline: 17 2/25:19 Goal status: INITIAL  6. Patient will increase lower extremity functional scale to >60/80 to demonstrate improved functional mobility and increased tolerance with ADLs.   Baseline: 35 07/18/23: 47  Goal status: INITIAL   CLINICAL IMPRESSION:  Pt presents for treatment and progress note. Pt shows progress toward all of his goals, meeting his BERG balance test goal with a perfect score and making progress with LEFS and DGI showing improved balance and mobility. Pt progressed with strength training with leg press to further progress his leg strength as his 5XSTS test showed minimal improvement. Patient's condition has the potential to improve in response to therapy. Maximum improvement is yet to be obtained. The anticipated improvement is attainable and reasonable in a generally predictable time. Pt will continue to benefit from skilled physical therapy intervention to address impairments, improve QOL, and attain therapy goals. Pt will be transitioning to therapy for his reverse total replacement scheduled in march following his surgery. Until then we will continue to focus on his balance and LE strength.     OBJECTIVE IMPAIRMENTS: Abnormal gait, decreased activity tolerance, decreased balance, decreased mobility, difficulty walking, decreased strength, increased edema, improper body mechanics, postural dysfunction, and pain.   ACTIVITY LIMITATIONS: lifting, bending, sitting, standing, squatting, stairs, and locomotion level  PARTICIPATION LIMITATIONS: cleaning, laundry, shopping, community activity, and yard work  PERSONAL FACTORS: Age, Fitness, Past/current experiences, Time since onset of injury/illness/exacerbation, and 3+ comorbidities: Per chart PMH significant for arthritis, benign bladder mass, complete tear of L RTC, depression, AD, heart murmur, LE DVT (2021 per chart, pt reports this was in 2022), hx of recurrent UTIs,  HTN, OSA on CPAP, pelvic pain, BLE edema, DDD, lumbar spondylosis,  scoliosis, LBP.  are also affecting patient's functional outcome.   REHAB POTENTIAL: Good  CLINICAL DECISION MAKING: Evolving/moderate complexity  EVALUATION COMPLEXITY: Moderate  PLAN:  PT FREQUENCY: 1-2x/week  PT DURATION: 12 weeks  PLANNED INTERVENTIONS: [id number]- PT Re-evaluation, 97110-Therapeutic exercises, 97530- Therapeutic activity, [id number]- Neuromuscular re-education, 97535- Self Care, [id number]- Manual therapy, [id number]- Gait training, [phone]- Orthotic Fit/training, [phone]- Canalith repositioning, [id number]- Splinting, [id number]- Electrical stimulation (manual), [id number]- Ultrasound, Patient/Family education, Balance training, Stair training, Taping, Dry Needling, Joint mobilization, Spinal mobilization, Scar mobilization, Vestibular training, Visual/preceptual remediation/compensation, DME instructions, Cryotherapy, and Moist heat  PLAN FOR NEXT SESSION:   Functional Strength and balance interventions, more dynamic than static but static still indicated. Head turns and object navigation for balance focus.    Norman Herrlich, PT 07/18/2023, 2:47 PM

## 2023-07-19 ENCOUNTER — Encounter: Payer: Medicare Other | Admitting: Dermatology

## 2023-07-20 ENCOUNTER — Encounter: Payer: Self-pay | Admitting: Family

## 2023-07-21 ENCOUNTER — Ambulatory Visit: Payer: Medicare Other | Admitting: Physical Therapy

## 2023-07-21 DIAGNOSIS — R262 Difficulty in walking, not elsewhere classified: Secondary | ICD-10-CM

## 2023-07-21 DIAGNOSIS — M6281 Muscle weakness (generalized): Secondary | ICD-10-CM | POA: Diagnosis not present

## 2023-07-21 DIAGNOSIS — R2681 Unsteadiness on feet: Secondary | ICD-10-CM

## 2023-07-21 NOTE — Therapy (Signed)
 OUTPATIENT PHYSICAL THERAPY NEURO TREATMENT  Patient Name: Vincent Cole MRN: [id number] DOB:06/27/1950, 73 y.o., male Today's Date: 07/21/2023   PCP: Margaretann Loveless, MD  REFERRING PROVIDER: Bridgette Habermann, PA-C   END OF SESSION:  PT End of Session - 07/21/23 1023     Visit Number 11    Number of Visits 25    Date for PT Re-Evaluation 08/31/23    Progress Note Due on Visit 20    PT Start Time 1020    PT Stop Time 1059    PT Time Calculation (min) 39 min    Equipment Utilized During Treatment Gait belt    Activity Tolerance Patient tolerated treatment well    Behavior During Therapy WFL for tasks assessed/performed                Past Medical History:  Diagnosis Date   Abdominal pain 01/17/2011   Formatting of this note might be different from the original.  Note: Unchanged - chronic  suprapubic/lower abdominal area since TURP in 2005; urology has now diagnosed, after extensive evaluation, interstitial cystitis; has also had repeat TURP in June 2010, and a surgery on the neck of the bladder in July of 2011     Actinic keratosis    Arthritis    Benign bladder mass    Complete tear of left rotator cuff 04/19/2017   Complication of anesthesia    PONV x 1 in 2010   Depression    Frequency of urination    GERD (gastroesophageal reflux disease) no meds   Heart murmur    was told many years ago   History of DVT of lower extremity 02/03/2020   History of recurrent UTIs    Hypertension    IC (interstitial cystitis)    Nocturia    OSA on CPAP nightly   Pelvic pain in male    PONV (postoperative nausea and vomiting)    Simple renal cyst    Urgency of urination    Urinary hesitancy    Past Surgical History:  Procedure Laterality Date   BUNIONECTOMY Right 01/15/2021   Procedure: Flavia Shipper;  Surgeon: Gwyneth Revels, DPM;  Location: [facility];  Service: Podiatry;  Laterality: Right;   COLONOSCOPY     COLONOSCOPY WITH PROPOFOL N/A 06/25/2021    Procedure: COLONOSCOPY WITH PROPOFOL;  Surgeon: Regis Bill, MD;  Location: [facility];  Service: Endoscopy;  Laterality: N/A;   CORRECTION OVERLAPPING TOES Right 01/15/2021   Procedure: CORRECTION OVERLAPPING TOES;  Surgeon: Gwyneth Revels, DPM;  Location: [facility];  Service: Podiatry;  Laterality: Right;   CYSTO WITH HYDRODISTENSION  06/02/2011   Procedure: CYSTOSCOPY/HYDRODISTENSION;  Surgeon: Martina Sinner, MD;  Location: [facility];  Service: Urology;  Laterality: N/A;  catheter placement   HAMMER TOE SURGERY Right 01/15/2021   Procedure: HAMMER TOE CORRECTION;  Surgeon: Gwyneth Revels, DPM;  Location: [facility];  Service: Podiatry;  Laterality: Right;   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   JOINT REPLACEMENT     shoulder, left knee   KNEE ARTHROSCOPY     TRANSURETHRAL RESECTION OF PROSTATE  X3   LAST ONE MAY 2012   WEIL OSTEOTOMY Right 01/15/2021   Procedure: WEIL;  Surgeon: Gwyneth Revels, DPM;  Location: [facility];  Service: Podiatry;  Laterality: Right;   Patient Active Problem List   Diagnosis Date Noted   GAD (generalized anxiety disorder) 06/05/2023   Low back pain 08/04/2022   Gastroesophageal reflux disease without esophagitis 07/22/2022  Leg pain 06/19/2022   BMI 36.0-36.9,adult 11/17/2020   Bilateral lower extremity edema 02/03/2020   DDD (degenerative disc disease), lumbosacral 06/22/2018   Lumbar spondylosis 06/22/2018   Pain in limb 10/20/2017   Arthritis 08/02/2017   Insomnia 08/02/2017   OSA (obstructive sleep apnea) 08/02/2017   BNC (bladder neck contracture) 07/31/2017   Frontal lobe deficit 11/22/2016   Essential hypertension, benign 05/21/2015   Arthritis of left knee 05/21/2015   Interstitial cystitis 07/04/2011   Disorder of skin and subcutaneous tissue 04/04/2011   Hyperlipidemia 04/04/2011   Benign prostatic hyperplasia with urinary obstruction 01/17/2011   Scoliosis 01/17/2011    ONSET DATE: 2022  REFERRING  DIAG:  Diagnosis  R26.89 (ICD-10-CM) - Imbalance  M79.604 (ICD-10-CM) - Right leg pain  Z86.718 (ICD-10-CM) - History of DVT of lower extremity    THERAPY DIAG:  Muscle weakness (generalized)  Unsteadiness on feet  Difficulty in walking, not elsewhere classified  Rationale for Evaluation and Treatment: Rehabilitation  SUBJECTIVE:                                                                                                                                                                                             SUBJECTIVE STATEMENT:  Pt reports that is doing okay, but states that he is feeling pain down the LLE from hip to just distal to the knee. Pt reports pain is there but he can "do everything"   Questions about received PT order for shoulder.     From eval: The patient is a pleasant 73 y/o male presenting to PT for imbalance and chronic BLE pain (R>L) that worsened following DVT in 2022. Pt reports worsening balance over three years. Pt also with extensive surgical hx (see chart), but notes pain with walking since knee replacements: R knee replacement in May 2024, L partial knee replacement in 2016. Since R knee replacement LLE has started to hurt again. Pt concerned about LE weakness as well as balance. He feels as if he is walking on "cushions" most of the time, has difficulty with turns, reports hx of scoliosis that also affects posture/gait. Pt has pins and numbness feeling in bilateral feet, reports this started following bunion and hammer toe surgeries. Pt reports occ numbness in bilateral hands while sleeping. He reports 1 fall in the last 6 months that occurred when trying to reach something while in the bath tub. He reports no head injury. Has had a few near-falls as well, usually while walking. He reports no dizziness. He reports occ neck pain, he has chronic LBP and has upcoming nerve block for it. Pt also with hx of  L shoulder replacement and upcoming R shoulder  replacements this March.  Pt accompanied by: self  PERTINENT HISTORY: Per chart PMH significant for arthritis, benign bladder mass, complete tear of L RTC, depression, AD, heart murmur, LE DVT (2021 per chart, pt reports this was in 2022), hx of recurrent UTIs, HTN, OSA on CPAP, pelvic pain, BLE edema, DDD, lumbar spondylosis, scoliosis, LBP. Please refer to chart for full details  PAIN:  Are you having pain? Yes: NPRS scale: 4/10 Pain location: chronic LBP, bilat LE pain Pain description: pins in feet Aggravating factors:   Relieving factors:    PRECAUTIONS: Fall  RED FLAGS: None   WEIGHT BEARING RESTRICTIONS: No  FALLS: Has patient fallen in last 6 months? Yes. Number of falls 1  LIVING ENVIRONMENT: Lives with: lives alone Lives in: House/apartment Stairs: [place]og[place] floor Has following equipment at home: Single point cane, Environmental consultant - 2 wheeled, and Grab bars  PLOF: Independent  PATIENT GOALS: I would like to be able to walk without the fear of falling.  OBJECTIVE:  Note: Objective measures were completed at Evaluation unless otherwise noted.  DIAGNOSTIC FINDINGS:  05/10/2023 MR BRAIN "IMPRESSION: 1. No acute intracranial abnormality or mass. 2. Unchanged mild frontal predominant volume loss and chronic small-vessel disease.     Electronically Signed   By: Orvan Falconer M.D.   On: 05/26/2023 09:07"  COGNITION: Overall cognitive status: Within functional limits for tasks assessed   SENSATION: WFL to light touch, does report pins and needles in feet  COORDINATION: WFL BLE  EDEMA:  Reports some RLE ankle swelling by the end of the day (reports this is a chronic issue)  POSTURE: weight shift right, has scoliosis    LOWER EXTREMITY MMT:   Grossly 4+/5 BLE, greatest weakness in hip flexors hip mm   TRANSFERS: Assistive device utilized: None  Sit to stand: Complete Independence Stand to sit: Complete Independence Chair to chair: Complete  Independence GAIT: Gait pattern:  L hip drop, significant weight shift to R in mid-stance Distance walked: , clinic distances Assistive device utilized: None Level of assistance: SBA   FUNCTIONAL TESTS:  5 times sit to stand: 11.37sec hands-free  - reports some pain in low back with testing : 1.03 m/s : deferred DGI: deferred Screen of gait with scanning (horizontal head turn) mild impairment/increased variability in BOS SLB: able to sustain LLE, unable to hold 1-2 sec RLE   PATIENT SURVEYS:  LEFS 35 FOTO 46 (goal score 52)                                                                                                                              TREATMENT DATE: 07/21/23     TE- To improve strength, endurance, mobility, and function of specific targeted muscle groups or improve joint range of motion or improve muscle flexibility  Nustep B UE and LE reciprocal movements level 4 x 6 min   Seated LAQ 2 x 10 ea  LE with 5# AW   Leg press   1 x 15 @ 55#  3 x 12 @ 70#  -cues for proper performance   TA- To improve functional movements patterns for everyday tasks  Standing march 2 x 10 ea LE with 5# AW Standing hip abd x 10 ea with 5#AW Standing hip extension 2 x 10 ea LE with 5# AW   NMR:   Resisted gait 17.5# x 3 laps ea direction, cues for improved balance during activity.   PATIENT EDUCATION: Education details:  Pt educated throughout session about proper posture and technique with exercises. Improved exercise technique, movement at target joints, use of target muscles after min to mod verbal, visual, tactile cues.  Person educated: Patient Education method: Explanation Education comprehension: verbalized understanding  HOME EXERCISE PROGRAM: Access Code: [email or web address] URL: [email or web address] Date: 06/12/2023 Prepared by: Thresa Ross  Exercises - Standing Tandem Balance with Counter Support  - 1 x daily - 7 x weekly - 3 sets -  10 reps - Slow Standing Marching  - 1 x daily - 7 x weekly - 2 sets - 10 reps - Sit to Stand with Arms Crossed  - 1 x daily - 7 x weekly - 2 sets - 12 reps  GOALS: ASSESSMENT: Reviewed with patient: yes  SHORT TERM GOALS: Target date: 07/20/2023    Patient will be independent in home exercise program to improve strength/mobility for better functional independence with ADLs. Baseline:2/25: completing regularly  Goal status: MET   LONG TERM GOALS: Target date: 08/31/2023    Patient will increase FOTO score to equal to or greater than   52  to demonstrate statistically significant improvement in mobility and quality of life.  Baseline: 46, 2/25: clinic discontinuing use of FOTO Goal status: NOT MET  2.  Patient will complete five times sit to stand test in < 10 seconds indicating an increased LE strength and improved balance. Baseline: 11.37 sec 2/25:11.3 sec  Goal status: IN PROGRESS  3.  Patient will increase Berg Balance score by > 6 points to demonstrate decreased fall risk during functional activities Baseline: 46/56 2/25:56 Goal status: MET  4.  Patient will increase 10 meter walk test to >1.48m/s as to improve gait speed for better community ambulation. Baseline: 1.03 m/s 2/25: 1.22 m/s Goal status: MET   5.  Patient will increase dynamic gait index score to >19/24 as to demonstrate reduced fall risk and improved dynamic gait balance for better safety with community/home ambulation.   Baseline: 17 2/25:19 Goal status: INITIAL  6. Patient will increase lower extremity functional scale to >60/80 to demonstrate improved functional mobility and increased tolerance with ADLs.   Baseline: 35 07/18/23: 47  Goal status: INITIAL   CLINICAL IMPRESSION:  Patient arrived with good motivation for completion of pt activities.   Pt challenged with higher resistance and more closed chain strengthening. Pt has one more session prior to his R reverse TSA next week. Will then  discharge and re-evaluate and follow protocol. Pt will continue to benefit from skilled physical therapy intervention to address impairments, improve QOL, and attain therapy goals.     OBJECTIVE IMPAIRMENTS: Abnormal gait, decreased activity tolerance, decreased balance, decreased mobility, difficulty walking, decreased strength, increased edema, improper body mechanics, postural dysfunction, and pain.   ACTIVITY LIMITATIONS: lifting, bending, sitting, standing, squatting, stairs, and locomotion level  PARTICIPATION LIMITATIONS: cleaning, laundry, shopping, community activity, and yard work  PERSONAL FACTORS: Age, Fitness, Past/current experiences, Time since onset of injury/illness/exacerbation, and 3+  comorbidities: Per chart PMH significant for arthritis, benign bladder mass, complete tear of L RTC, depression, AD, heart murmur, LE DVT (2021 per chart, pt reports this was in 2022), hx of recurrent UTIs, HTN, OSA on CPAP, pelvic pain, BLE edema, DDD, lumbar spondylosis, scoliosis, LBP.  are also affecting patient's functional outcome.   REHAB POTENTIAL: Good  CLINICAL DECISION MAKING: Evolving/moderate complexity  EVALUATION COMPLEXITY: Moderate  PLAN:  PT FREQUENCY: 1-2x/week  PT DURATION: 12 weeks  PLANNED INTERVENTIONS: 97164- PT Re-evaluation, 97110-Therapeutic exercises, 97530- Therapeutic activity, [id number]- Neuromuscular re-education, 97535- Self Care, 95621- Manual therapy, [id number]- Gait training, [phone]- Orthotic Fit/training, [phone]- Canalith repositioning, [id number]- Splinting, [id number]- Electrical stimulation (manual), [id number]- Ultrasound, Patient/Family education, Balance training, Stair training, Taping, Dry Needling, Joint mobilization, Spinal mobilization, Scar mobilization, Vestibular training, Visual/preceptual remediation/compensation, DME instructions, Cryotherapy, and Moist heat  PLAN FOR NEXT SESSION:   Functional Strength and balance interventions, more dynamic than static but  static still indicated. Head turns and object navigation for balance focus.  D/C from balance POC for prep for R RTSA later next week    Norman Herrlich, PT 07/21/2023, 11:02 AM

## 2023-07-25 ENCOUNTER — Ambulatory Visit: Payer: Medicare Other | Admitting: Physical Therapy

## 2023-07-28 ENCOUNTER — Ambulatory Visit: Payer: Medicare Other

## 2023-08-02 ENCOUNTER — Encounter: Payer: Self-pay | Admitting: Physical Therapy

## 2023-08-02 ENCOUNTER — Ambulatory Visit: Payer: Medicare Other | Attending: Physician Assistant

## 2023-08-02 DIAGNOSIS — M25511 Pain in right shoulder: Secondary | ICD-10-CM | POA: Insufficient documentation

## 2023-08-02 DIAGNOSIS — M25611 Stiffness of right shoulder, not elsewhere classified: Secondary | ICD-10-CM | POA: Insufficient documentation

## 2023-08-02 DIAGNOSIS — M6281 Muscle weakness (generalized): Secondary | ICD-10-CM | POA: Diagnosis present

## 2023-08-02 NOTE — Therapy (Signed)
 OUTPATIENT PHYSICAL THERAPY SHOULDER EVALUATION   Patient Name: Vincent Cole MRN: [id number] DOB:[birth year], 73 y.o., male Today's Date: 08/02/2023  END OF SESSION:  PT End of Session - 08/02/23 1404     Visit Number 1    Number of Visits 25    Date for PT Re-Evaluation 10/25/23    Progress Note Due on Visit 10    PT Start Time 1405    PT Stop Time 1438    PT Time Calculation (min) 33 min    Activity Tolerance Patient tolerated treatment well    Behavior During Therapy [facility] for tasks assessed/performed             Past Medical History:  Diagnosis Date   Abdominal pain 01/17/2011   Formatting of this note might be different from the original.  Note: Unchanged - chronic  suprapubic/lower abdominal area since TURP in 2005; urology has now diagnosed, after extensive evaluation, interstitial cystitis; has also had repeat TURP in June 2010, and a surgery on the neck of the bladder in July of 2011     Actinic keratosis    Arthritis    Benign bladder mass    Complete tear of left rotator cuff 04/19/2017   Complication of anesthesia    PONV x 1 in 2010   Depression    Frequency of urination    GERD (gastroesophageal reflux disease) no meds   Heart murmur    was told many years ago   History of DVT of lower extremity 02/03/2020   History of recurrent UTIs    Hypertension    IC (interstitial cystitis)    Nocturia    OSA on CPAP nightly   Pelvic pain in male    PONV (postoperative nausea and vomiting)    Simple renal cyst    Urgency of urination    Urinary hesitancy    Past Surgical History:  Procedure Laterality Date   BUNIONECTOMY Right 01/15/2021   Procedure: Flavia Shipper;  Surgeon: Gwyneth Revels, DPM;  Location: [facility];  Service: Podiatry;  Laterality: Right;   COLONOSCOPY     COLONOSCOPY WITH PROPOFOL N/A 06/25/2021   Procedure: COLONOSCOPY WITH PROPOFOL;  Surgeon: Regis Bill, MD;  Location: [facility];  Service: Endoscopy;  Laterality:  N/A;   CORRECTION OVERLAPPING TOES Right 01/15/2021   Procedure: CORRECTION OVERLAPPING TOES;  Surgeon: Gwyneth Revels, DPM;  Location: [facility];  Service: Podiatry;  Laterality: Right;   CYSTO WITH HYDRODISTENSION  06/02/2011   Procedure: CYSTOSCOPY/HYDRODISTENSION;  Surgeon: Martina Sinner, MD;  Location: [facility];  Service: Urology;  Laterality: N/A;  catheter placement   HAMMER TOE SURGERY Right 01/15/2021   Procedure: HAMMER TOE CORRECTION;  Surgeon: Gwyneth Revels, DPM;  Location: [facility];  Service: Podiatry;  Laterality: Right;   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   JOINT REPLACEMENT     shoulder, left knee   KNEE ARTHROSCOPY     TRANSURETHRAL RESECTION OF PROSTATE  X3   LAST ONE MAY 2012   WEIL OSTEOTOMY Right 01/15/2021   Procedure: WEIL;  Surgeon: Gwyneth Revels, DPM;  Location: [facility];  Service: Podiatry;  Laterality: Right;   Patient Active Problem List   Diagnosis Date Noted   GAD (generalized anxiety disorder) 06/05/2023   Low back pain 08/04/2022   Gastroesophageal reflux disease without esophagitis 07/22/2022   Leg pain 06/19/2022   BMI 36.0-36.9,adult 11/17/2020   Bilateral lower extremity edema 02/03/2020   DDD (degenerative disc disease), lumbosacral 06/22/2018  Lumbar spondylosis 06/22/2018   Pain in limb 10/20/2017   Arthritis 08/02/2017   Insomnia 08/02/2017   OSA (obstructive sleep apnea) 08/02/2017   BNC (bladder neck contracture) 07/31/2017   Frontal lobe deficit 11/22/2016   Essential hypertension, benign 05/21/2015   Arthritis of left knee 05/21/2015   Interstitial cystitis 07/04/2011   Disorder of skin and subcutaneous tissue 04/04/2011   Hyperlipidemia 04/04/2011   Benign prostatic hyperplasia with urinary obstruction 01/17/2011   Scoliosis 01/17/2011    PCP: Meliton Rattan, MD  REFERRING PROVIDER: Altamese Cabal, PA-C   REFERRING DIAG: [phone] (ICD-10-CM) - Presence of right artificial shoulder  joint  THERAPY DIAG:  Acute pain of right shoulder  Muscle weakness (generalized)  Stiffness of right shoulder, not elsewhere classified  Rationale for Evaluation and Treatment: Rehabilitation  ONSET DATE: 07/27/2023  SUBJECTIVE:                                                                                                                                                                                      SUBJECTIVE STATEMENT: Patient reports he is doing well since the surgery and that he has been doing exercises with his wrist and hand. Patient wanting to be able to take care of himself and the house which includes reaching above his head.  Hand dominance: Right  PERTINENT HISTORY: S/p R reverse TSA on 07/27/2023 Recently being seen in this clinic for balance and LE weakness and discharge 07/21/23.   PAIN:  Are you having pain? No - currently taking pain medication   PRECAUTIONS: Shoulder  RED FLAGS: None   WEIGHT BEARING RESTRICTIONS: Yes NWB   FALLS:  Has patient fallen in last 6 months? No  LIVING ENVIRONMENT: Lives with: lives alone - daughter is currently staying with him for a couple of weeks Lives in: House/apartment Has following equipment at home: None  OCCUPATION: Retired   PLOF: Independent  PATIENT GOALS: to be able to do what he needs to do without pain   NEXT MD VISIT:   OBJECTIVE:  Note: Objective measures were completed at Evaluation unless otherwise noted.  DIAGNOSTIC FINDINGS:  Post op imaging not present in chart   PATIENT SURVEYS:  Quick Dash 70.5/100 = 70.5%  COGNITION: Overall cognitive status: Within functional limits for tasks assessed     SENSATION: WFL  POSTURE: Forward head, rounded shoulders   UPPER EXTREMITY ROM:   Passive ROM Right eval Left eval  Shoulder flexion 105   Shoulder extension    Shoulder abduction 75   Shoulder adduction    Shoulder internal rotation    Shoulder external rotation 35   Elbow flexion     Elbow  extension    Wrist flexion    Wrist extension    Wrist ulnar deviation    Wrist radial deviation    Wrist pronation    Wrist supination    (Blank rows = not tested)  UPPER EXTREMITY MMT:    Deferred due to recent sx and protocol  MMT Right eval Left eval  Shoulder flexion    Shoulder extension    Shoulder abduction    Shoulder adduction    Shoulder internal rotation    Shoulder external rotation    Middle trapezius    Lower trapezius    Elbow flexion    Elbow extension    Wrist flexion    Wrist extension    Wrist ulnar deviation    Wrist radial deviation    Wrist pronation    Wrist supination    Grip strength (lbs)    (Blank rows = not tested)  SHOULDER SPECIAL TESTS: N/A  JOINT MOBILITY TESTING:  Deferred at this time due to recent sx   PALPATION:  Significant bruising noted to entire R upper arm from elbow to shoulder  General tenderness to incision which is covered by waterproof bandage                                                                                                                             TREATMENT DATE: 08/02/23   PATIENT EDUCATION: Education details: HEP, POC, goals  Person educated: Patient Education method: Explanation, Demonstration, and Handouts Education comprehension: verbalized understanding and returned demonstration  HOME EXERCISE PROGRAM: Access Code: [email or web address] URL: [email or web address] Date: 08/02/2023 Prepared by: Maylon Peppers  Exercises - Circular Shoulder Pendulum with Table Support  - 2-3 x daily - 5-7 x weekly - 2-3 sets - 10 reps - Seated Shoulder Flexion Towel Slide at Table Top  - 2-3 x daily - 5-7 x weekly - 3 sets - 10 reps - Seated Shoulder Abduction Towel Slide at Table Top  - 2-3 x daily - 5-7 x weekly - 3 sets - 10 reps  ASSESSMENT:  CLINICAL IMPRESSION: Patient is a 73 y.o. male who was seen today for physical therapy evaluation and treatment after R reverse TSA. Patient  arrives in shoulder immobilizer and able to don/doff with minA. Currently, PROM limited which is to be expected. HEP initiated with pendulums and table slides in flexion and abduction with returned demonstrated with good form. Will progress patient per reverse TSA protocol. Patient will benefit from skilled PT services to address listed impairments to assist with return to PLOF and ability to perform ADLs and overhead activities with reduction in pain.   OBJECTIVE IMPAIRMENTS: decreased endurance, decreased mobility, decreased ROM, decreased strength, hypomobility, impaired flexibility, impaired UE functional use, postural dysfunction, and pain.   ACTIVITY LIMITATIONS: carrying, lifting, sleeping, bathing, toileting, dressing, reach over head, and hygiene/grooming  PARTICIPATION LIMITATIONS: meal prep, cleaning, laundry, driving, and community activity  PERSONAL FACTORS: Age, Past/current experiences, and Time since onset of  injury/illness/exacerbation are also affecting patient's functional outcome.   REHAB POTENTIAL: Good  CLINICAL DECISION MAKING: Stable/uncomplicated  EVALUATION COMPLEXITY: Moderate   GOALS: Goals reviewed with patient? Yes  SHORT TERM GOALS: Target date: 09/13/2023  Patient will be independent in HEP to improve strength/mobility for better functional independence with ADLs. Baseline: 3/12: HEP initiated  Goal status: INITIAL   LONG TERM GOALS: Target date: 10/25/2023    Patient will decrease Quick DASH score by > 15 points demonstrating reduced self-reported upper extremity disability. Baseline: 3/12: 70.5% Goal status: INITIAL  2.  Patient will improve shoulder AROM to > 120 degrees of flexion, scaption, and abduction for improved ability to perform overhead activities. Baseline: 3/12: see chart above -currently only PROM per protocol Goal status: INITIAL  3.  Patient will improve R UE strength to >4/5 MMT to demonstrate ability to complete ADLs and other  functional tasks with R UE for reaching overhead.  Baseline: 3/12: no AROM per protocol Goal status: INITIAL  4.  Patient will report a worst pain of <3/10 on NRPS in R UE to improve tolerance with ADLs and reduced symptoms with activities.  Baseline: 3/12: 6-7/10 pain Goal status: INITIAL   PLAN:  PT FREQUENCY: 1-2x/week  PT DURATION: 12 weeks  PLANNED INTERVENTIONS: 97164- PT Re-evaluation, 97110-Therapeutic exercises, 97530- Therapeutic activity, 97112- Neuromuscular re-education, 97535- Self Care, 16109- Manual therapy, [id number]- Electrical stimulation (unattended), [phone]- Electrical stimulation (manual), Patient/Family education, Taping, Dry Needling, Joint mobilization, Joint manipulation, Scar mobilization, DME instructions, Cryotherapy, and Moist heat  PLAN FOR NEXT SESSION: HEP review, PROM, progress per protocol    Maylon Peppers, PT, DPT Physical Therapist - [facility]  08/02/2023, 2:44 PM

## 2023-08-03 ENCOUNTER — Ambulatory Visit: Payer: Medicare Other | Admitting: Physical Therapy

## 2023-08-04 ENCOUNTER — Other Ambulatory Visit: Payer: Self-pay

## 2023-08-04 ENCOUNTER — Ambulatory Visit: Payer: Medicare Other | Admitting: Physical Therapy

## 2023-08-04 DIAGNOSIS — M6281 Muscle weakness (generalized): Secondary | ICD-10-CM

## 2023-08-04 DIAGNOSIS — M25511 Pain in right shoulder: Secondary | ICD-10-CM

## 2023-08-04 DIAGNOSIS — M25611 Stiffness of right shoulder, not elsewhere classified: Secondary | ICD-10-CM

## 2023-08-04 MED ORDER — HYDROXYZINE HCL 25 MG PO TABS: 25.0000 mg | ORAL_TABLET | Freq: Three times a day (TID) | ORAL | 1 refills | Status: DC

## 2023-08-04 NOTE — Therapy (Signed)
 OUTPATIENT PHYSICAL THERAPY SHOULDER TREATMENT   Patient Name: Vincent Cole MRN: [id number] DOB:23-Sep-1950, 73 y.o., male Today's Date: 08/04/2023  END OF SESSION:  PT End of Session - 08/04/23 1025     Visit Number 2    Number of Visits 25    Date for PT Re-Evaluation 10/25/23    Progress Note Due on Visit 10    PT Start Time 1021    PT Stop Time 1059    PT Time Calculation (min) 38 min    Activity Tolerance Patient tolerated treatment well    Behavior During Therapy [facility] for tasks assessed/performed              Past Medical History:  Diagnosis Date   Abdominal pain 01/17/2011   Formatting of this note might be different from the original.  Note: Unchanged - chronic  suprapubic/lower abdominal area since TURP in 2005; urology has now diagnosed, after extensive evaluation, interstitial cystitis; has also had repeat TURP in June 2010, and a surgery on the neck of the bladder in July of 2011     Actinic keratosis    Arthritis    Benign bladder mass    Complete tear of left rotator cuff 04/19/2017   Complication of anesthesia    PONV x 1 in 2010   Depression    Frequency of urination    GERD (gastroesophageal reflux disease) no meds   Heart murmur    was told many years ago   History of DVT of lower extremity 02/03/2020   History of recurrent UTIs    Hypertension    IC (interstitial cystitis)    Nocturia    OSA on CPAP nightly   Pelvic pain in male    PONV (postoperative nausea and vomiting)    Simple renal cyst    Urgency of urination    Urinary hesitancy    Past Surgical History:  Procedure Laterality Date   BUNIONECTOMY Right 01/15/2021   Procedure: Flavia Shipper;  Surgeon: Gwyneth Revels, DPM;  Location: [facility];  Service: Podiatry;  Laterality: Right;   COLONOSCOPY     COLONOSCOPY WITH PROPOFOL N/A 06/25/2021   Procedure: COLONOSCOPY WITH PROPOFOL;  Surgeon: Regis Bill, MD;  Location: [facility];  Service: Endoscopy;  Laterality:  N/A;   CORRECTION OVERLAPPING TOES Right 01/15/2021   Procedure: CORRECTION OVERLAPPING TOES;  Surgeon: Gwyneth Revels, DPM;  Location: [facility];  Service: Podiatry;  Laterality: Right;   CYSTO WITH HYDRODISTENSION  06/02/2011   Procedure: CYSTOSCOPY/HYDRODISTENSION;  Surgeon: Martina Sinner, MD;  [facility]  Service: Urology;  Laterality: N/A;  catheter placement   HAMMER TOE SURGERY Right 01/15/2021   Procedure: HAMMER TOE CORRECTION;  Surgeon: Gwyneth Revels, DPM;  Location: [facility];  Service: Podiatry;  Laterality: Right;   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   JOINT REPLACEMENT     shoulder, left knee   KNEE ARTHROSCOPY     TRANSURETHRAL RESECTION OF PROSTATE  X3   LAST ONE MAY 2012   WEIL OSTEOTOMY Right 01/15/2021   Procedure: WEIL;  Surgeon: Gwyneth Revels, DPM;  Location: [facility];  Service: Podiatry;  Laterality: Right;   Patient Active Problem List   Diagnosis Date Noted   GAD (generalized anxiety disorder) 06/05/2023   Low back pain 08/04/2022   Gastroesophageal reflux disease without esophagitis 07/22/2022   Leg pain 06/19/2022   BMI 36.0-36.9,adult 11/17/2020   Bilateral lower extremity edema 02/03/2020   DDD (degenerative disc disease), lumbosacral 06/22/2018  Lumbar spondylosis 06/22/2018   Pain in limb 10/20/2017   Arthritis 08/02/2017   Insomnia 08/02/2017   OSA (obstructive sleep apnea) 08/02/2017   BNC (bladder neck contracture) 07/31/2017   Frontal lobe deficit 11/22/2016   Essential hypertension, benign 05/21/2015   Arthritis of left knee 05/21/2015   Interstitial cystitis 07/04/2011   Disorder of skin and subcutaneous tissue 04/04/2011   Hyperlipidemia 04/04/2011   Benign prostatic hyperplasia with urinary obstruction 01/17/2011   Scoliosis 01/17/2011    PCP: Meliton Rattan, MD  REFERRING PROVIDER: Altamese Cabal, PA-C   REFERRING DIAG: [phone] (ICD-10-CM) - Presence of right artificial shoulder  joint  THERAPY DIAG:  Acute pain of right shoulder  Muscle weakness (generalized)  Stiffness of right shoulder, not elsewhere classified  Rationale for Evaluation and Treatment: Rehabilitation  ONSET DATE: 07/27/2023  SUBJECTIVE:                                                                                                                                                                                      SUBJECTIVE STATEMENT: Patient reports he is doing well since the surgery and that he has been doing exercises with his wrist and hand. Patient wanting to be able to take care of himself and the house which includes reaching above his head.  Hand dominance: Right  PERTINENT HISTORY: S/p R reverse TSA on 07/27/2023 Recently being seen in this clinic for balance and LE weakness and discharge 07/21/23.   PAIN:  Are you having pain? No - currently taking pain medication   PRECAUTIONS: Shoulder  RED FLAGS: None   WEIGHT BEARING RESTRICTIONS: Yes NWB   FALLS:  Has patient fallen in last 6 months? No  LIVING ENVIRONMENT: Lives with: lives alone - daughter is currently staying with him for a couple of weeks Lives in: House/apartment Has following equipment at home: None  OCCUPATION: Retired   PLOF: Independent  PATIENT GOALS: to be able to do what he needs to do without pain   NEXT MD VISIT:   OBJECTIVE:  Note: Objective measures were completed at Evaluation unless otherwise noted.  DIAGNOSTIC FINDINGS:  Post op imaging not present in chart   PATIENT SURVEYS:  Quick Dash 70.5/100 = 70.5%  COGNITION: Overall cognitive status: Within functional limits for tasks assessed     SENSATION: WFL  POSTURE: Forward head, rounded shoulders   UPPER EXTREMITY ROM:   Passive ROM Right eval Left eval  Shoulder flexion 105   Shoulder extension    Shoulder abduction 75   Shoulder adduction    Shoulder internal rotation    Shoulder external rotation 35   Elbow flexion     Elbow  extension    Wrist flexion    Wrist extension    Wrist ulnar deviation    Wrist radial deviation    Wrist pronation    Wrist supination    (Blank rows = not tested)  UPPER EXTREMITY MMT:    Deferred due to recent sx and protocol  MMT Right eval Left eval  Shoulder flexion    Shoulder extension    Shoulder abduction    Shoulder adduction    Shoulder internal rotation    Shoulder external rotation    Middle trapezius    Lower trapezius    Elbow flexion    Elbow extension    Wrist flexion    Wrist extension    Wrist ulnar deviation    Wrist radial deviation    Wrist pronation    Wrist supination    Grip strength (lbs)    (Blank rows = not tested)  SHOULDER SPECIAL TESTS: N/A  JOINT MOBILITY TESTING:  Deferred at this time due to recent sx   PALPATION:  Significant bruising noted to entire R upper arm from elbow to shoulder  General tenderness to incision which is covered by waterproof bandage                                                                                                                             TREATMENT DATE: 08/04/23 Seated with arm propped on pillow  - wrist flexion x 20 1# then x 20 2#   - wrist extension x 20 with 1# then x 20 2#   - supination/ pronation x 20   - elbow flexion and extension 2 x 10 reps  Shoulder flexion on plinth ( AAROM) focus on using trunk to proper shoulder to utilize less shoulder girdle muscles. X 10   - same with scaption  PROM in supine ABD, Scaption, flexion and all between, 10-30 sec holds at end rang of motion. Minimal pain throughout    Elasto gel cold compression applied at end of session for 5 x min   PATIENT EDUCATION: Education details: HEP, POC, goals  Person educated: Patient Education method: Explanation, Demonstration, and Handouts Education comprehension: verbalized understanding and returned demonstration  HOME EXERCISE PROGRAM: Access Code: [email or web address] URL:  [email or web address] Date: 08/02/2023 Prepared by: Maylon Peppers  Exercises - Circular Shoulder Pendulum with Table Support  - 2-3 x daily - 5-7 x weekly - 2-3 sets - 10 reps - Seated Shoulder Flexion Towel Slide at Table Top  - 2-3 x daily - 5-7 x weekly - 3 sets - 10 reps - Seated Shoulder Abduction Towel Slide at Table Top  - 2-3 x daily - 5-7 x weekly - 3 sets - 10 reps  ASSESSMENT:  CLINICAL IMPRESSION: Patient arrived with good motivation for completion of pt activities.  Pt begins with light wrist and elbow ROM and strengthening while still unable to progress shoulder girdle strength.  Pt requires high level assistance and cuing for completion  of exercises in order to provide adequate level of stimulation challenge while minimizing pain and discomfort when possible. Pt closely monitored throughout session pt response and to maximize patient safety during interventions. Pt continues to demonstrate progress toward goals AEB progression of interventions this date either in volume or intensity.   OBJECTIVE IMPAIRMENTS: decreased endurance, decreased mobility, decreased ROM, decreased strength, hypomobility, impaired flexibility, impaired UE functional use, postural dysfunction, and pain.   ACTIVITY LIMITATIONS: carrying, lifting, sleeping, bathing, toileting, dressing, reach over head, and hygiene/grooming  PARTICIPATION LIMITATIONS: meal prep, cleaning, laundry, driving, and community activity  PERSONAL FACTORS: Age, Past/current experiences, and Time since onset of injury/illness/exacerbation are also affecting patient's functional outcome.   REHAB POTENTIAL: Good  CLINICAL DECISION MAKING: Stable/uncomplicated  EVALUATION COMPLEXITY: Moderate   GOALS: Goals reviewed with patient? Yes  SHORT TERM GOALS: Target date: 09/13/2023  Patient will be independent in HEP to improve strength/mobility for better functional independence with ADLs. Baseline: 3/12: HEP  initiated  Goal status: INITIAL   LONG TERM GOALS: Target date: 10/25/2023    Patient will decrease Quick DASH score by > 15 points demonstrating reduced self-reported upper extremity disability. Baseline: 3/12: 70.5% Goal status: INITIAL  2.  Patient will improve shoulder AROM to > 120 degrees of flexion, scaption, and abduction for improved ability to perform overhead activities. Baseline: 3/12: see chart above -currently only PROM per protocol Goal status: INITIAL  3.  Patient will improve R UE strength to >4/5 MMT to demonstrate ability to complete ADLs and other functional tasks with R UE for reaching overhead.  Baseline: 3/12: no AROM per protocol Goal status: INITIAL  4.  Patient will report a worst pain of <3/10 on NRPS in R UE to improve tolerance with ADLs and reduced symptoms with activities.  Baseline: 3/12: 6-7/10 pain Goal status: INITIAL   PLAN:  PT FREQUENCY: 1-2x/week  PT DURATION: 12 weeks  PLANNED INTERVENTIONS: 97164- PT Re-evaluation, 97110-Therapeutic exercises, 97530- Therapeutic activity, 97112- Neuromuscular re-education, 97535- Self Care, 95621- Manual therapy, G0283- Electrical stimulation (unattended), [phone]- Electrical stimulation (manual), Patient/Family education, Taping, Dry Needling, Joint mobilization, Joint manipulation, Scar mobilization, DME instructions, Cryotherapy, and Moist heat  PLAN FOR NEXT SESSION: HEP review, PROM, progress per protocol    Norman Herrlich PT ,DPT Physical Therapist- [facility]  [facility]    08/04/2023, 10:29 AM

## 2023-08-08 ENCOUNTER — Ambulatory Visit: Payer: Medicare Other | Admitting: Physical Therapy

## 2023-08-08 DIAGNOSIS — M25511 Pain in right shoulder: Secondary | ICD-10-CM | POA: Diagnosis not present

## 2023-08-08 DIAGNOSIS — M25611 Stiffness of right shoulder, not elsewhere classified: Secondary | ICD-10-CM

## 2023-08-08 DIAGNOSIS — M6281 Muscle weakness (generalized): Secondary | ICD-10-CM

## 2023-08-08 NOTE — Therapy (Signed)
 OUTPATIENT PHYSICAL THERAPY SHOULDER TREATMENT   Patient Name: Vincent Cole MRN: [id number] DOB:1950-12-18, 73 y.o., male Today's Date: 08/08/2023  END OF SESSION:  PT End of Session - 08/08/23 1407     Visit Number 3    Number of Visits 25    Date for PT Re-Evaluation 10/25/23    Progress Note Due on Visit 10    PT Start Time 1404    PT Stop Time 1442    PT Time Calculation (min) 38 min    Activity Tolerance Patient tolerated treatment well    Behavior During Therapy [facility] for tasks assessed/performed              Past Medical History:  Diagnosis Date   Abdominal pain 01/17/2011   Formatting of this note might be different from the original.  Note: Unchanged - chronic  suprapubic/lower abdominal area since TURP in 2005; urology has now diagnosed, after extensive evaluation, interstitial cystitis; has also had repeat TURP in June 2010, and a surgery on the neck of the bladder in July of 2011     Actinic keratosis    Arthritis    Benign bladder mass    Complete tear of left rotator cuff 04/19/2017   Complication of anesthesia    PONV x 1 in 2010   Depression    Frequency of urination    GERD (gastroesophageal reflux disease) no meds   Heart murmur    was told many years ago   History of DVT of lower extremity 02/03/2020   History of recurrent UTIs    Hypertension    IC (interstitial cystitis)    Nocturia    OSA on CPAP nightly   Pelvic pain in male    PONV (postoperative nausea and vomiting)    Simple renal cyst    Urgency of urination    Urinary hesitancy    Past Surgical History:  Procedure Laterality Date   BUNIONECTOMY Right 01/15/2021   Procedure: Flavia Shipper;  Surgeon: Gwyneth Revels, DPM;  Location: [facility];  Service: Podiatry;  Laterality: Right;   COLONOSCOPY     COLONOSCOPY WITH PROPOFOL N/A 06/25/2021   Procedure: COLONOSCOPY WITH PROPOFOL;  Surgeon: Regis Bill, MD;  Location: [facility];  Service: Endoscopy;  Laterality:  N/A;   CORRECTION OVERLAPPING TOES Right 01/15/2021   Procedure: CORRECTION OVERLAPPING TOES;  Surgeon: Gwyneth Revels, DPM;  Location: [facility];  Service: Podiatry;  Laterality: Right;   CYSTO WITH HYDRODISTENSION  06/02/2011   Procedure: CYSTOSCOPY/HYDRODISTENSION;  Surgeon: Martina Sinner, MD;  Location: [facility];  Service: Urology;  Laterality: N/A;  catheter placement   HAMMER TOE SURGERY Right 01/15/2021   Procedure: HAMMER TOE CORRECTION;  Surgeon: Gwyneth Revels, DPM;  Location: [facility];  Service: Podiatry;  Laterality: Right;   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   JOINT REPLACEMENT     shoulder, left knee   KNEE ARTHROSCOPY     TRANSURETHRAL RESECTION OF PROSTATE  X3   LAST ONE MAY 2012   WEIL OSTEOTOMY Right 01/15/2021   Procedure: WEIL;  Surgeon: Gwyneth Revels, DPM;  Location: [facility];  Service: Podiatry;  Laterality: Right;   Patient Active Problem List   Diagnosis Date Noted   GAD (generalized anxiety disorder) 06/05/2023   Low back pain 08/04/2022   Gastroesophageal reflux disease without esophagitis 07/22/2022   Leg pain 06/19/2022   BMI 36.0-36.9,adult 11/17/2020   Bilateral lower extremity edema 02/03/2020   DDD (degenerative disc disease), lumbosacral 06/22/2018  Lumbar spondylosis 06/22/2018   Pain in limb 10/20/2017   Arthritis 08/02/2017   Insomnia 08/02/2017   OSA (obstructive sleep apnea) 08/02/2017   BNC (bladder neck contracture) 07/31/2017   Frontal lobe deficit 11/22/2016   Essential hypertension, benign 05/21/2015   Arthritis of left knee 05/21/2015   Interstitial cystitis 07/04/2011   Disorder of skin and subcutaneous tissue 04/04/2011   Hyperlipidemia 04/04/2011   Benign prostatic hyperplasia with urinary obstruction 01/17/2011   Scoliosis 01/17/2011    PCP: Meliton Rattan, MD  REFERRING PROVIDER: Altamese Cabal, PA-C   REFERRING DIAG: [phone] (ICD-10-CM) - Presence of right artificial shoulder  joint  THERAPY DIAG:  Acute pain of right shoulder  Muscle weakness (generalized)  Stiffness of right shoulder, not elsewhere classified  Rationale for Evaluation and Treatment: Rehabilitation  ONSET DATE: 07/27/2023  SUBJECTIVE:                                                                                                                                                                                      SUBJECTIVE STATEMENT: Patient reports he is doing well and HEP is going well. Sees PA for follow up this afternoon.  Hand dominance: Right  PERTINENT HISTORY: S/p R reverse TSA on 07/27/2023 Recently being seen in this clinic for balance and LE weakness and discharge 07/21/23.   PAIN:  Are you having pain? No - currently taking pain medication   PRECAUTIONS: Shoulder  RED FLAGS: None   WEIGHT BEARING RESTRICTIONS: Yes NWB   FALLS:  Has patient fallen in last 6 months? No  LIVING ENVIRONMENT: Lives with: lives alone - daughter is currently staying with him for a couple of weeks Lives in: House/apartment Has following equipment at home: None  OCCUPATION: Retired   PLOF: Independent  PATIENT GOALS: to be able to do what he needs to do without pain   NEXT MD VISIT:   OBJECTIVE:  Note: Objective measures were completed at Evaluation unless otherwise noted.  DIAGNOSTIC FINDINGS:  Post op imaging not present in chart   PATIENT SURVEYS:  Quick Dash 70.5/100 = 70.5%  COGNITION: Overall cognitive status: Within functional limits for tasks assessed     SENSATION: WFL  POSTURE: Forward head, rounded shoulders   UPPER EXTREMITY ROM:   Passive ROM Right eval Left eval  Shoulder flexion 105   Shoulder extension    Shoulder abduction 75   Shoulder adduction    Shoulder internal rotation    Shoulder external rotation 35   Elbow flexion    Elbow extension    Wrist flexion    Wrist extension    Wrist ulnar deviation    Wrist radial deviation  Wrist  pronation    Wrist supination    (Blank rows = not tested)  UPPER EXTREMITY MMT:    Deferred due to recent sx and protocol  MMT Right eval Left eval  Shoulder flexion    Shoulder extension    Shoulder abduction    Shoulder adduction    Shoulder internal rotation    Shoulder external rotation    Middle trapezius    Lower trapezius    Elbow flexion    Elbow extension    Wrist flexion    Wrist extension    Wrist ulnar deviation    Wrist radial deviation    Wrist pronation    Wrist supination    Grip strength (lbs)    (Blank rows = not tested)  SHOULDER SPECIAL TESTS: N/A  JOINT MOBILITY TESTING:  Deferred at this time due to recent sx   PALPATION:  Significant bruising noted to entire R upper arm from elbow to shoulder  General tenderness to incision which is covered by waterproof bandage                                                                                                                             TREATMENT DATE: 08/08/23 Seated with arm propped on pillow  - wrist flexion x 15 2# then x 15 3#   - wrist extension x 15 with 2# then x 15 3#   - supination/ pronation x 20 with 2#   - elbow flexion and extension 2 x 20 reps with 1#   Supine shoulder isometrics gentle with PT hand for resistance  Extension, adduction, abduction 3 x 10 ea, very gentle and ensured no pain increase throughout.   PROM in supine ABD, Scaption, flexion and all between, 10-30 sec holds at end rang of motion. Minimal pain throughout    Elasto gel cold compression applied at end of session for 5 x min   PATIENT EDUCATION: Education details: HEP, POC, goals  Person educated: Patient Education method: Explanation, Demonstration, and Handouts Education comprehension: verbalized understanding and returned demonstration  HOME EXERCISE PROGRAM: Access Code: [email or web address] URL: [email or web address] Date: 08/02/2023 Prepared by: Maylon Peppers  Exercises - Circular  Shoulder Pendulum with Table Support  - 2-3 x daily - 5-7 x weekly - 2-3 sets - 10 reps - Seated Shoulder Flexion Towel Slide at Table Top  - 2-3 x daily - 5-7 x weekly - 3 sets - 10 reps - Seated Shoulder Abduction Towel Slide at Table Top  - 2-3 x daily - 5-7 x weekly - 3 sets - 10 reps  ASSESSMENT:  CLINICAL IMPRESSION: Patient arrived with good motivation for completion of pt activities. Pt requires high level assistance and cuing for completion of exercises in order to provide adequate level of stimulation challenge while minimizing pain and discomfort when possible. Pt closely monitored throughout session pt response and to maximize patient safety during interventions.  Began with very light isometrics for shoulder abduction  abduction and extension all in neutral positioning with physical therapist monitoring intensity of muscle contraction.  Patient reported no increase in pain throughout session.  Pt continues to demonstrate progress toward goals AEB progression of interventions this date either in volume or intensity.   OBJECTIVE IMPAIRMENTS: decreased endurance, decreased mobility, decreased ROM, decreased strength, hypomobility, impaired flexibility, impaired UE functional use, postural dysfunction, and pain.   ACTIVITY LIMITATIONS: carrying, lifting, sleeping, bathing, toileting, dressing, reach over head, and hygiene/grooming  PARTICIPATION LIMITATIONS: meal prep, cleaning, laundry, driving, and community activity  PERSONAL FACTORS: Age, Past/current experiences, and Time since onset of injury/illness/exacerbation are also affecting patient's functional outcome.   REHAB POTENTIAL: Good  CLINICAL DECISION MAKING: Stable/uncomplicated  EVALUATION COMPLEXITY: Moderate   GOALS: Goals reviewed with patient? Yes  SHORT TERM GOALS: Target date: 09/13/2023  Patient will be independent in HEP to improve strength/mobility for better functional independence with ADLs. Baseline: 3/12:  HEP initiated  Goal status: INITIAL   LONG TERM GOALS: Target date: 10/25/2023    Patient will decrease Quick DASH score by > 15 points demonstrating reduced self-reported upper extremity disability. Baseline: 3/12: 70.5% Goal status: INITIAL  2.  Patient will improve shoulder AROM to > 120 degrees of flexion, scaption, and abduction for improved ability to perform overhead activities. Baseline: 3/12: see chart above -currently only PROM per protocol Goal status: INITIAL  3.  Patient will improve R UE strength to >4/5 MMT to demonstrate ability to complete ADLs and other functional tasks with R UE for reaching overhead.  Baseline: 3/12: no AROM per protocol Goal status: INITIAL  4.  Patient will report a worst pain of <3/10 on NRPS in R UE to improve tolerance with ADLs and reduced symptoms with activities.  Baseline: 3/12: 6-7/10 pain Goal status: INITIAL   PLAN:  PT FREQUENCY: 1-2x/week  PT DURATION: 12 weeks  PLANNED INTERVENTIONS: 97164- PT Re-evaluation, 97110-Therapeutic exercises, 97530- Therapeutic activity, 97112- Neuromuscular re-education, 97535- Self Care, 21308- Manual therapy, G0283- Electrical stimulation (unattended), [phone]- Electrical stimulation (manual), Patient/Family education, Taping, Dry Needling, Joint mobilization, Joint manipulation, Scar mobilization, DME instructions, Cryotherapy, and Moist heat  PLAN FOR NEXT SESSION: HEP review, PROM, isometrics gentle as indicated   Norman Herrlich PT ,DPT Physical Therapist- [facility]  [facility]    08/08/2023, 2:08 PM

## 2023-08-11 ENCOUNTER — Ambulatory Visit: Payer: Medicare Other

## 2023-08-11 DIAGNOSIS — M25511 Pain in right shoulder: Secondary | ICD-10-CM

## 2023-08-11 DIAGNOSIS — M6281 Muscle weakness (generalized): Secondary | ICD-10-CM

## 2023-08-11 DIAGNOSIS — M25611 Stiffness of right shoulder, not elsewhere classified: Secondary | ICD-10-CM

## 2023-08-11 NOTE — Therapy (Signed)
 OUTPATIENT PHYSICAL THERAPY TREATMENT   Patient Name: Vincent Cole MRN: [id number] DOB:[birth year], 73 y.o., male Today's Date: 08/11/2023  END OF SESSION:  PT End of Session - 08/11/23 1108     Visit Number 4    Number of Visits 25    Date for PT Re-Evaluation 10/25/23    Authorization Type Medicare; BCBS fed secondary    Progress Note Due on Visit 10    PT Start Time 1015    PT Stop Time 1100    PT Time Calculation (min) 45 min    Activity Tolerance Patient tolerated treatment well;No increased pain    Behavior During Therapy [facility] for tasks assessed/performed              Past Medical History:  Diagnosis Date   Abdominal pain 01/17/2011   Formatting of this note might be different from the original.  Note: Unchanged - chronic  suprapubic/lower abdominal area since TURP in 2005; urology has now diagnosed, after extensive evaluation, interstitial cystitis; has also had repeat TURP in June 2010, and a surgery on the neck of the bladder in July of 2011     Actinic keratosis    Arthritis    Benign bladder mass    Complete tear of left rotator cuff 04/19/2017   Complication of anesthesia    PONV x 1 in 2010   Depression    Frequency of urination    GERD (gastroesophageal reflux disease) no meds   Heart murmur    was told many years ago   History of DVT of lower extremity 02/03/2020   History of recurrent UTIs    Hypertension    IC (interstitial cystitis)    Nocturia    OSA on CPAP nightly   Pelvic pain in male    PONV (postoperative nausea and vomiting)    Simple renal cyst    Urgency of urination    Urinary hesitancy    Past Surgical History:  Procedure Laterality Date   BUNIONECTOMY Right 01/15/2021   Procedure: Flavia Shipper;  Surgeon: Gwyneth Revels, DPM;  Location: [facility];  Service: Podiatry;  Laterality: Right;   COLONOSCOPY     COLONOSCOPY WITH PROPOFOL N/A 06/25/2021   Procedure: COLONOSCOPY WITH PROPOFOL;  Surgeon: Regis Bill, MD;   Location: [facility];  Service: Endoscopy;  Laterality: N/A;   CORRECTION OVERLAPPING TOES Right 01/15/2021   Procedure: CORRECTION OVERLAPPING TOES;  Surgeon: Gwyneth Revels, DPM;  Location: [facility];  Service: Podiatry;  Laterality: Right;   CYSTO WITH HYDRODISTENSION  06/02/2011   Procedure: CYSTOSCOPY/HYDRODISTENSION;  Surgeon: Martina Sinner, MD;  Location: [facility];  Service: Urology;  Laterality: N/A;  catheter placement   HAMMER TOE SURGERY Right 01/15/2021   Procedure: HAMMER TOE CORRECTION;  Surgeon: Gwyneth Revels, DPM;  Location: [facility];  Service: Podiatry;  Laterality: Right;   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   JOINT REPLACEMENT     shoulder, left knee   KNEE ARTHROSCOPY     TRANSURETHRAL RESECTION OF PROSTATE  X3   LAST ONE MAY 2012   WEIL OSTEOTOMY Right 01/15/2021   Procedure: WEIL;  Surgeon: Gwyneth Revels, DPM;  Location: [facility];  Service: Podiatry;  Laterality: Right;   Patient Active Problem List   Diagnosis Date Noted   GAD (generalized anxiety disorder) 06/05/2023   Low back pain 08/04/2022   Gastroesophageal reflux disease without esophagitis 07/22/2022   Leg pain 06/19/2022   BMI 36.0-36.9,adult 11/17/2020   Bilateral lower extremity  edema 02/03/2020   DDD (degenerative disc disease), lumbosacral 06/22/2018   Lumbar spondylosis 06/22/2018   Pain in limb 10/20/2017   Arthritis 08/02/2017   Insomnia 08/02/2017   OSA (obstructive sleep apnea) 08/02/2017   BNC (bladder neck contracture) 07/31/2017   Frontal lobe deficit 11/22/2016   Essential hypertension, benign 05/21/2015   Arthritis of left knee 05/21/2015   Interstitial cystitis 07/04/2011   Disorder of skin and subcutaneous tissue 04/04/2011   Hyperlipidemia 04/04/2011   Benign prostatic hyperplasia with urinary obstruction 01/17/2011   Scoliosis 01/17/2011    PCP: Meliton Rattan, MD  REFERRING PROVIDER: Altamese Cabal, PA-C   REFERRING DIAG: [phone]  (ICD-10-CM) - Presence of right artificial shoulder joint  THERAPY DIAG:  Acute pain of right shoulder  Muscle weakness (generalized)  Stiffness of right shoulder, not elsewhere classified  Rationale for Evaluation and Treatment: Rehabilitation  ONSET DATE: 07/27/2023  SUBJECTIVE:                                                                                                                                                                                      SUBJECTIVE STATEMENT: Pt denies any pertinent updates since last visit. HEP is doing just fine.   PERTINENT HISTORY: S/p R reverse TSA on 07/27/2023 Recently being seen in this clinic for balance and LE weakness and discharge 07/21/23.   Hand dominance: Right PAIN:  Are you having pain? Yes 2-3/10 "not bad"   PRECAUTIONS: Shoulder  WEIGHT BEARING RESTRICTIONS: Yes NWB   FALLS:  Has patient fallen in last 6 months? No  LIVING ENVIRONMENT: Lives with: lives alone - daughter is currently staying with him for a couple of weeks Lives in: House/apartment Has following equipment at home: None  OCCUPATION: Retired   PLOF: Independent  PATIENT GOALS: to be able to do what he needs to do without pain   NEXT MD VISIT:   OBJECTIVE:  Note: Objective measures were completed at Evaluation unless otherwise noted.  DIAGNOSTIC FINDINGS:  Post op imaging not present in chart   PATIENT SURVEYS:  Quick Dash 70.5/100 = 70.5%  COGNITION: Overall cognitive status: Within functional limits for tasks assessed     SENSATION: WFL  POSTURE: Forward head, rounded shoulders   UPPER EXTREMITY ROM:   Passive ROM Right eval Right 08/11/23  Shoulder flexion 105 115  Shoulder extension    Shoulder abduction 75 90  Shoulder adduction    Shoulder internal rotation    Shoulder external rotation 35 105  Elbow flexion    Elbow extension    (Blank rows = not tested)  PALPATION:  Significant bruising noted to entire R upper arm  from elbow  to shoulder  General tenderness to incision which is covered by waterproof bandage                                                                                                                       TODAY'S TREATMENT: 08/11/23 -pt doffs sling, coat without assist: author points out use of RUE during activity, educated on avoiding extension, behind back movements.  -supine RUE PROM 5x60sec each flexion, ABDCT, ER in scapular plane, then 1x10 P/ROM of each  -supine Rt towel squeeze 10x3secH, Elbow flexion/extension ROM x15 -supine Rt wrist circumduction 10x CW, 10x CCW  -short sitting RUE pendulum -short sitting elbow wrist supination/pronation x10 -short sitting bilat scapular retraction x10 (cued to keep fingers clasped to prevent shoulder extension)  -HEP #2 issued  PATIENT EDUCATION: Education details: HEP, POC, goals  Person educated: Patient Education method: Explanation, Demonstration, and Handouts Education comprehension: verbalized understanding and returned demonstration  HOME EXERCISE PROGRAM:  Access Code: [email or web address] URL: [email or web address] Date: 08/11/2023 Prepared by: Alvera Novel  Exercises - Seated Shoulder Pendulum Exercise  - 3 x daily - 1 sets - 15 reps - 5 hold - Standing Elbow Flexion Extension AROM  - 5 x daily - 1 sets - 15 reps - Seated Scapular Retraction  - 1 x daily - 3 sets - 10 reps - 3sec hold - Standing Forearm Pronation and Supination AROM  - 1 x daily - 3 sets - 10 reps - Wrist Circumduction AROM  - 5 x daily - 1 sets - 15 reps - Seated Gripping Towel  - 5 x daily - 1 sets - 10 reps - 5sec hold  Access Code: [email or web address] URL: [email or web address] Date: 08/02/2023 Prepared by: Maylon Peppers  Exercises - Circular Shoulder Pendulum with Table Support  - 2-3 x daily - 5-7 x weekly - 2-3 sets - 10 reps - Seated Shoulder Flexion Towel Slide at Table Top  - 2-3 x daily - 5-7 x weekly - 3 sets - 10 reps - Seated  Shoulder Abduction Towel Slide at Table Top  - 2-3 x daily - 5-7 x weekly - 3 sets - 10 reps  ASSESSMENT:  CLINICAL IMPRESSION: Pain at goal. ROM now up to permitted degree on protocol. HEP updated to include more recent [facility] ROM. Reminded pt about avoiding extension, internal rotaition, behind back movements. Patient reported no increase in pain throughout session.  Pt continues to demonstrate progress toward goals AEB progression of interventions this date either in volume or intensity.  OBJECTIVE IMPAIRMENTS: decreased endurance, decreased mobility, decreased ROM, decreased strength, hypomobility, impaired flexibility, impaired UE functional use, postural dysfunction, and pain.   ACTIVITY LIMITATIONS: carrying, lifting, sleeping, bathing, toileting, dressing, reach over head, and hygiene/grooming  PARTICIPATION LIMITATIONS: meal prep, cleaning, laundry, driving, and community activity  PERSONAL FACTORS: Age, Past/current experiences, and Time since onset of injury/illness/exacerbation are also affecting patient's functional outcome.   REHAB POTENTIAL: Good  CLINICAL DECISION MAKING: Stable/uncomplicated  EVALUATION COMPLEXITY: Moderate  GOALS: Goals reviewed with patient?  Yes  SHORT TERM GOALS: Target date: 09/13/2023  Patient will be independent in HEP to improve strength/mobility for better functional independence with ADLs. Baseline: 3/12: HEP initiated  Goal status: INITIAL   LONG TERM GOALS: Target date: 10/25/2023   Patient will decrease Quick DASH score by > 15 points demonstrating reduced self-reported upper extremity disability. Baseline: 3/12: 70.5% Goal status: INITIAL  2.  Patient will improve shoulder AROM to > 120 degrees of flexion, scaption, and abduction for improved ability to perform overhead activities. Baseline: 3/12: see chart above -currently only PROM per protocol Goal status: INITIAL  3.  Patient will improve R UE strength to >4/5 MMT to demonstrate  ability to complete ADLs and other functional tasks with R UE for reaching overhead.  Baseline: 3/12: no AROM per protocol Goal status: INITIAL  4.  Patient will report a worst pain of <3/10 on NRPS in R UE to improve tolerance with ADLs and reduced symptoms with activities.  Baseline: 3/12: 6-7/10 pain Goal status: INITIAL   PLAN:  PT FREQUENCY: 1-2x/week  PT DURATION: 12 weeks  PLANNED INTERVENTIONS: 97164- PT Re-evaluation, 97110-Therapeutic exercises, 97530- Therapeutic activity, 97112- Neuromuscular re-education, 97535- Self Care, 91478- Manual therapy, G0283- Electrical stimulation (unattended), [phone]- Electrical stimulation (manual), Patient/Family education, Taping, Dry Needling, Joint mobilization, Joint manipulation, Scar mobilization, DME instructions, Cryotherapy, and Moist heat  PLAN FOR NEXT SESSION: HEP review, continue PROM   Rosamaria Lints PT ,DPT Physical Therapist- [facility]   11:11 AM, 08/11/23 Rosamaria Lints, PT, DPT Physical Therapist - [facility]  Outpatient Physical Therapy- Main Campus [phone]     08/11/2023, 11:10 AM

## 2023-08-15 ENCOUNTER — Ambulatory Visit: Payer: Medicare Other | Admitting: Physical Therapy

## 2023-08-15 DIAGNOSIS — M25511 Pain in right shoulder: Secondary | ICD-10-CM | POA: Diagnosis not present

## 2023-08-15 DIAGNOSIS — M25611 Stiffness of right shoulder, not elsewhere classified: Secondary | ICD-10-CM

## 2023-08-15 DIAGNOSIS — M6281 Muscle weakness (generalized): Secondary | ICD-10-CM

## 2023-08-15 NOTE — Therapy (Signed)
 OUTPATIENT PHYSICAL THERAPY TREATMENT   Patient Name: Vincent Cole MRN: [id number] DOB:10/11/1950, 73 y.o., male Today's Date: 08/15/2023  END OF SESSION:  PT End of Session - 08/15/23 1317     Visit Number 5    Number of Visits 25    Date for PT Re-Evaluation 10/25/23    Authorization Type Medicare; BCBS fed secondary    Progress Note Due on Visit 10    PT Start Time 1318    PT Stop Time 1356    PT Time Calculation (min) 38 min    Activity Tolerance Patient tolerated treatment well;No increased pain    Behavior During Therapy [facility] for tasks assessed/performed              Past Medical History:  Diagnosis Date   Abdominal pain 01/17/2011   Formatting of this note might be different from the original.  Note: Unchanged - chronic  suprapubic/lower abdominal area since TURP in 2005; urology has now diagnosed, after extensive evaluation, interstitial cystitis; has also had repeat TURP in June 2010, and a surgery on the neck of the bladder in July of 2011     Actinic keratosis    Arthritis    Benign bladder mass    Complete tear of left rotator cuff 04/19/2017   Complication of anesthesia    PONV x 1 in 2010   Depression    Frequency of urination    GERD (gastroesophageal reflux disease) no meds   Heart murmur    was told many years ago   History of DVT of lower extremity 02/03/2020   History of recurrent UTIs    Hypertension    IC (interstitial cystitis)    Nocturia    OSA on CPAP nightly   Pelvic pain in male    PONV (postoperative nausea and vomiting)    Simple renal cyst    Urgency of urination    Urinary hesitancy    Past Surgical History:  Procedure Laterality Date   BUNIONECTOMY Right 01/15/2021   Procedure: Flavia Shipper;  Surgeon: Gwyneth Revels, DPM;  Location: [facility];  Service: Podiatry;  Laterality: Right;   COLONOSCOPY     COLONOSCOPY WITH PROPOFOL N/A 06/25/2021   Procedure: COLONOSCOPY WITH PROPOFOL;  Surgeon: Regis Bill, MD;   Location: [facility];  Service: Endoscopy;  Laterality: N/A;   CORRECTION OVERLAPPING TOES Right 01/15/2021   Procedure: CORRECTION OVERLAPPING TOES;  Surgeon: Gwyneth Revels, DPM;  Location: [facility];  Service: Podiatry;  Laterality: Right;   CYSTO WITH HYDRODISTENSION  06/02/2011   Procedure: CYSTOSCOPY/HYDRODISTENSION;  Surgeon: Martina Sinner, MD;  Location: [facility];  Service: Urology;  Laterality: N/A;  catheter placement   HAMMER TOE SURGERY Right 01/15/2021   Procedure: HAMMER TOE CORRECTION;  Surgeon: Gwyneth Revels, DPM;  Location: [facility];  Service: Podiatry;  Laterality: Right;   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   JOINT REPLACEMENT     shoulder, left knee   KNEE ARTHROSCOPY     TRANSURETHRAL RESECTION OF PROSTATE  X3   LAST ONE MAY 2012   WEIL OSTEOTOMY Right 01/15/2021   Procedure: WEIL;  Surgeon: Gwyneth Revels, DPM;  Location: [facility];  Service: Podiatry;  Laterality: Right;   Patient Active Problem List   Diagnosis Date Noted   GAD (generalized anxiety disorder) 06/05/2023   Low back pain 08/04/2022   Gastroesophageal reflux disease without esophagitis 07/22/2022   Leg pain 06/19/2022   BMI 36.0-36.9,adult 11/17/2020   Bilateral lower extremity  edema 02/03/2020   DDD (degenerative disc disease), lumbosacral 06/22/2018   Lumbar spondylosis 06/22/2018   Pain in limb 10/20/2017   Arthritis 08/02/2017   Insomnia 08/02/2017   OSA (obstructive sleep apnea) 08/02/2017   BNC (bladder neck contracture) 07/31/2017   Frontal lobe deficit 11/22/2016   Essential hypertension, benign 05/21/2015   Arthritis of left knee 05/21/2015   Interstitial cystitis 07/04/2011   Disorder of skin and subcutaneous tissue 04/04/2011   Hyperlipidemia 04/04/2011   Benign prostatic hyperplasia with urinary obstruction 01/17/2011   Scoliosis 01/17/2011    PCP: Meliton Rattan, MD  REFERRING PROVIDER: Altamese Cabal, PA-C   REFERRING DIAG: [phone]  (ICD-10-CM) - Presence of right artificial shoulder joint  THERAPY DIAG:  Acute pain of right shoulder  Muscle weakness (generalized)  Stiffness of right shoulder, not elsewhere classified  Rationale for Evaluation and Treatment: Rehabilitation  ONSET DATE: 07/27/2023  SUBJECTIVE:                                                                                                                                                                                      SUBJECTIVE STATEMENT: Pt denies any pertinent updates since last visit. New HEP going well, still minimal pain  PERTINENT HISTORY: S/p R reverse TSA on 07/27/2023 Recently being seen in this clinic for balance and LE weakness and discharge 07/21/23.   Hand dominance: Right PAIN:  Are you having pain? Yes 2-3/10 "not bad"   PRECAUTIONS: Shoulder  WEIGHT BEARING RESTRICTIONS: Yes NWB   FALLS:  Has patient fallen in last 6 months? No  LIVING ENVIRONMENT: Lives with: lives alone - daughter is currently staying with him for a couple of weeks Lives in: House/apartment Has following equipment at home: None  OCCUPATION: Retired   PLOF: Independent  PATIENT GOALS: to be able to do what he needs to do without pain   NEXT MD VISIT:   OBJECTIVE:  Note: Objective measures were completed at Evaluation unless otherwise noted.  DIAGNOSTIC FINDINGS:  Post op imaging not present in chart   PATIENT SURVEYS:  Quick Dash 70.5/100 = 70.5%  COGNITION: Overall cognitive status: Within functional limits for tasks assessed     SENSATION: WFL  POSTURE: Forward head, rounded shoulders   UPPER EXTREMITY ROM:   Passive ROM Right eval Right 08/11/23  Shoulder flexion 105 115  Shoulder extension    Shoulder abduction 75 90  Shoulder adduction    Shoulder internal rotation    Shoulder external rotation 35 105  Elbow flexion    Elbow extension    (Blank rows = not tested)  PALPATION:  Significant bruising noted to entire  R upper arm from  elbow to shoulder  General tenderness to incision which is covered by waterproof bandage                                                                                                                       TODAY'S TREATMENT: 08/15/23 TE -pt doffs sling, coat without assist: author points out use of RUE during activity, educated on avoiding extension, behind back movements.  - supine elbow flexion and extension to comfortable range x 20  -supine RUE PROM 5x60sec each flexion, ABDCT, ER in scapular plane, then 1x10 P/ROM of each  -supine Rt wrist supination/ pronation with 2#  - sitting bilat scapular retraction x10 (cued to keep fingers clasped to prevent shoulder extension)  - sitting UE ranger x 20 flex, hor abd/ add, CCW, CW - sitting isometric flexion and abduction x 20 into PT resistance    PATIENT EDUCATION: Education details: HEP, POC, goals  Person educated: Patient Education method: Explanation, Demonstration, and Handouts Education comprehension: verbalized understanding and returned demonstration  HOME EXERCISE PROGRAM:  Access Code: [email or web address] URL: [email or web address] Date: 08/11/2023 Prepared by: Alvera Novel  Exercises - Seated Shoulder Pendulum Exercise  - 3 x daily - 1 sets - 15 reps - 5 hold - Standing Elbow Flexion Extension AROM  - 5 x daily - 1 sets - 15 reps - Seated Scapular Retraction  - 1 x daily - 3 sets - 10 reps - 3sec hold - Standing Forearm Pronation and Supination AROM  - 1 x daily - 3 sets - 10 reps - Wrist Circumduction AROM  - 5 x daily - 1 sets - 15 reps - Seated Gripping Towel  - 5 x daily - 1 sets - 10 reps - 5sec hold  Access Code: [email or web address] URL: [email or web address] Date: 08/02/2023 Prepared by: Maylon Peppers  Exercises - Circular Shoulder Pendulum with Table Support  - 2-3 x daily - 5-7 x weekly - 2-3 sets - 10 reps - Seated Shoulder Flexion Towel Slide at Table Top  - 2-3 x daily - 5-7 x  weekly - 3 sets - 10 reps - Seated Shoulder Abduction Towel Slide at Table Top  - 2-3 x daily - 5-7 x weekly - 3 sets - 10 reps  ASSESSMENT:  CLINICAL IMPRESSION: Pain at goal. Pt ROM continues to improve with each session. Pt still has minimal pain with progressed interventions. Will continue to progress pt per protocol in future sessions.  Pt continues to demonstrate progress toward goals AEB progression of interventions this date either in volume or intensity.  OBJECTIVE IMPAIRMENTS: decreased endurance, decreased mobility, decreased ROM, decreased strength, hypomobility, impaired flexibility, impaired UE functional use, postural dysfunction, and pain.   ACTIVITY LIMITATIONS: carrying, lifting, sleeping, bathing, toileting, dressing, reach over head, and hygiene/grooming  PARTICIPATION LIMITATIONS: meal prep, cleaning, laundry, driving, and community activity  PERSONAL FACTORS: Age, Past/current experiences, and Time since onset of injury/illness/exacerbation are also affecting patient's functional outcome.   REHAB POTENTIAL: Good  CLINICAL DECISION MAKING: Stable/uncomplicated  EVALUATION  COMPLEXITY: Moderate  GOALS: Goals reviewed with patient? Yes  SHORT TERM GOALS: Target date: 09/13/2023  Patient will be independent in HEP to improve strength/mobility for better functional independence with ADLs. Baseline: 3/12: HEP initiated  Goal status: INITIAL   LONG TERM GOALS: Target date: 10/25/2023   Patient will decrease Quick DASH score by > 15 points demonstrating reduced self-reported upper extremity disability. Baseline: 3/12: 70.5% Goal status: INITIAL  2.  Patient will improve shoulder AROM to > 120 degrees of flexion, scaption, and abduction for improved ability to perform overhead activities. Baseline: 3/12: see chart above -currently only PROM per protocol Goal status: INITIAL  3.  Patient will improve R UE strength to >4/5 MMT to demonstrate ability to complete ADLs  and other functional tasks with R UE for reaching overhead.  Baseline: 3/12: no AROM per protocol Goal status: INITIAL  4.  Patient will report a worst pain of <3/10 on NRPS in R UE to improve tolerance with ADLs and reduced symptoms with activities.  Baseline: 3/12: 6-7/10 pain Goal status: INITIAL   PLAN:  PT FREQUENCY: 1-2x/week  PT DURATION: 12 weeks  PLANNED INTERVENTIONS: 97164- PT Re-evaluation, 97110-Therapeutic exercises, 97530- Therapeutic activity, 97112- Neuromuscular re-education, 97535- Self Care, 09811- Manual therapy, G0283- Electrical stimulation (unattended), [phone]- Electrical stimulation (manual), Patient/Family education, Taping, Dry Needling, Joint mobilization, Joint manipulation, Scar mobilization, DME instructions, Cryotherapy, and Moist heat  PLAN FOR NEXT SESSION: HEP review, continue PROM   Norman Herrlich PT ,DPT Physical Therapist- [facility]  [facility]  08/15/2023, 1:18 PM

## 2023-08-17 ENCOUNTER — Ambulatory Visit: Payer: Medicare Other | Admitting: Physical Therapy

## 2023-08-17 DIAGNOSIS — M25511 Pain in right shoulder: Secondary | ICD-10-CM | POA: Diagnosis not present

## 2023-08-17 DIAGNOSIS — M25611 Stiffness of right shoulder, not elsewhere classified: Secondary | ICD-10-CM

## 2023-08-17 DIAGNOSIS — M6281 Muscle weakness (generalized): Secondary | ICD-10-CM

## 2023-08-17 NOTE — Therapy (Signed)
 OUTPATIENT PHYSICAL THERAPY TREATMENT   Patient Name: Vincent Cole MRN: [id number] DOB:[birth year], 73 y.o., male Today's Date: 08/17/2023  END OF SESSION:  PT End of Session - 08/17/23 1319     Visit Number 6    Number of Visits 25    Date for PT Re-Evaluation 10/25/23    Authorization Type Medicare; BCBS fed secondary    Progress Note Due on Visit 10    PT Start Time 1319    PT Stop Time 1359    PT Time Calculation (min) 40 min    Activity Tolerance Patient tolerated treatment well;No increased pain    Behavior During Therapy [facility] for tasks assessed/performed              Past Medical History:  Diagnosis Date   Abdominal pain 01/17/2011   Formatting of this note might be different from the original.  Note: Unchanged - chronic  suprapubic/lower abdominal area since TURP in 2005; urology has now diagnosed, after extensive evaluation, interstitial cystitis; has also had repeat TURP in June 2010, and a surgery on the neck of the bladder in July of 2011     Actinic keratosis    Arthritis    Benign bladder mass    Complete tear of left rotator cuff 04/19/2017   Complication of anesthesia    PONV x 1 in 2010   Depression    Frequency of urination    GERD (gastroesophageal reflux disease) no meds   Heart murmur    was told many years ago   History of DVT of lower extremity 02/03/2020   History of recurrent UTIs    Hypertension    IC (interstitial cystitis)    Nocturia    OSA on CPAP nightly   Pelvic pain in male    PONV (postoperative nausea and vomiting)    Simple renal cyst    Urgency of urination    Urinary hesitancy    Past Surgical History:  Procedure Laterality Date   BUNIONECTOMY Right 01/15/2021   Procedure: Flavia Shipper;  Surgeon: Gwyneth Revels, DPM;  Location: [facility];  Service: Podiatry;  Laterality: Right;   COLONOSCOPY     COLONOSCOPY WITH PROPOFOL N/A 06/25/2021   Procedure: COLONOSCOPY WITH PROPOFOL;  Surgeon: Regis Bill, MD;   Location: [facility];  Service: Endoscopy;  Laterality: N/A;   CORRECTION OVERLAPPING TOES Right 01/15/2021   Procedure: CORRECTION OVERLAPPING TOES;  Surgeon: Gwyneth Revels, DPM;  Location: [facility];  Service: Podiatry;  Laterality: Right;   CYSTO WITH HYDRODISTENSION  06/02/2011   Procedure: CYSTOSCOPY/HYDRODISTENSION;  Surgeon: Martina Sinner, MD;  Location: [facility];  Service: Urology;  Laterality: N/A;  catheter placement   HAMMER TOE SURGERY Right 01/15/2021   Procedure: HAMMER TOE CORRECTION;  Surgeon: Gwyneth Revels, DPM;  Location: [facility];  Service: Podiatry;  Laterality: Right;   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   JOINT REPLACEMENT     shoulder, left knee   KNEE ARTHROSCOPY     TRANSURETHRAL RESECTION OF PROSTATE  X3   LAST ONE MAY 2012   WEIL OSTEOTOMY Right 01/15/2021   Procedure: WEIL;  Surgeon: Gwyneth Revels, DPM;  Location: [facility];  Service: Podiatry;  Laterality: Right;   Patient Active Problem List   Diagnosis Date Noted   GAD (generalized anxiety disorder) 06/05/2023   Low back pain 08/04/2022   Gastroesophageal reflux disease without esophagitis 07/22/2022   Leg pain 06/19/2022   BMI 36.0-36.9,adult 11/17/2020   Bilateral lower extremity  edema 02/03/2020   DDD (degenerative disc disease), lumbosacral 06/22/2018   Lumbar spondylosis 06/22/2018   Pain in limb 10/20/2017   Arthritis 08/02/2017   Insomnia 08/02/2017   OSA (obstructive sleep apnea) 08/02/2017   BNC (bladder neck contracture) 07/31/2017   Frontal lobe deficit 11/22/2016   Essential hypertension, benign 05/21/2015   Arthritis of left knee 05/21/2015   Interstitial cystitis 07/04/2011   Disorder of skin and subcutaneous tissue 04/04/2011   Hyperlipidemia 04/04/2011   Benign prostatic hyperplasia with urinary obstruction 01/17/2011   Scoliosis 01/17/2011    PCP: Meliton Rattan, MD  REFERRING PROVIDER: Altamese Cabal, PA-C   REFERRING DIAG: [phone]  (ICD-10-CM) - Presence of right artificial shoulder joint  THERAPY DIAG:  Acute pain of right shoulder  Muscle weakness (generalized)  Stiffness of right shoulder, not elsewhere classified  Rationale for Evaluation and Treatment: Rehabilitation  ONSET DATE: 07/27/2023  SUBJECTIVE:                                                                                                                                                                                      SUBJECTIVE STATEMENT: Pt denies any pertinent updates since last visit. New HEP going well, still minimal pain  PERTINENT HISTORY: S/p R reverse TSA on 07/27/2023 Recently being seen in this clinic for balance and LE weakness and discharge 07/21/23.   Hand dominance: Right PAIN:  Are you having pain? Yes 2-3/10 "not bad"   PRECAUTIONS: Shoulder  WEIGHT BEARING RESTRICTIONS: Yes NWB   FALLS:  Has patient fallen in last 6 months? No  LIVING ENVIRONMENT: Lives with: lives alone - daughter is currently staying with him for a couple of weeks Lives in: House/apartment Has following equipment at home: None  OCCUPATION: Retired   PLOF: Independent  PATIENT GOALS: to be able to do what he needs to do without pain   NEXT MD VISIT:   OBJECTIVE:  Note: Objective measures were completed at Evaluation unless otherwise noted.  DIAGNOSTIC FINDINGS:  Post op imaging not present in chart   PATIENT SURVEYS:  Quick Dash 70.5/100 = 70.5%  COGNITION: Overall cognitive status: Within functional limits for tasks assessed     SENSATION: WFL  POSTURE: Forward head, rounded shoulders   UPPER EXTREMITY ROM:   Passive ROM Right eval Right 08/11/23  Shoulder flexion 105 115  Shoulder extension    Shoulder abduction 75 90  Shoulder adduction    Shoulder internal rotation    Shoulder external rotation 35 105  Elbow flexion    Elbow extension    (Blank rows = not tested)  PALPATION:  Significant bruising noted to entire  R upper arm from  elbow to shoulder  General tenderness to incision which is covered by waterproof bandage                                                                                                                       TODAY'S TREATMENT: 08/17/23 TE -pt doffs sling without assist - supine elbow flexion and extension with 1# x 20  -supine RUE PROM 5x60sec each flexion, ABDCT, ER in scapular plane  -supine Rt wrist supination/ pronation with 2#  - supine isometric flexion and abduction x 20 into PT resistance  - sitting bilat scapular retraction x10 (cued to keep fingers clasped to prevent shoulder extension)  Shoulder rolls x 15 both directions ( activating UT, levator, periscapular musculature) - sitting UE ranger x 20 flex, hor abd/ add, CCW, CW - sitting wrist pro/supination x 20 ea and with 3# dumbbell    PATIENT EDUCATION: Education details: HEP, POC, goals  Person educated: Patient Education method: Explanation, Demonstration, and Handouts Education comprehension: verbalized understanding and returned demonstration  HOME EXERCISE PROGRAM:  Access Code: [email or web address] URL: [email or web address] Date: 08/11/2023 Prepared by: Alvera Novel  Exercises - Seated Shoulder Pendulum Exercise  - 3 x daily - 1 sets - 15 reps - 5 hold - Standing Elbow Flexion Extension AROM  - 5 x daily - 1 sets - 15 reps - Seated Scapular Retraction  - 1 x daily - 3 sets - 10 reps - 3sec hold - Standing Forearm Pronation and Supination AROM  - 1 x daily - 3 sets - 10 reps - Wrist Circumduction AROM  - 5 x daily - 1 sets - 15 reps - Seated Gripping Towel  - 5 x daily - 1 sets - 10 reps - 5sec hold  Access Code: [email or web address] URL: [email or web address] Date: 08/02/2023 Prepared by: Maylon Peppers  Exercises - Circular Shoulder Pendulum with Table Support  - 2-3 x daily - 5-7 x weekly - 2-3 sets - 10 reps - Seated Shoulder Flexion Towel Slide at Table Top  - 2-3 x daily - 5-7 x  weekly - 3 sets - 10 reps - Seated Shoulder Abduction Towel Slide at Table Top  - 2-3 x daily - 5-7 x weekly - 3 sets - 10 reps  ASSESSMENT:  CLINICAL IMPRESSION: Patient arrived with good motivation for completion of pt activities.  Continued with current plan of care as laid out in evaluation and recent prior sessions. Pt remains motivated to advance progress toward goals in order to maximize independence and safety at home. Pt requires high level assistance and cuing for completion of exercises in order to provide adequate level of stimulation challenge while minimizing pain and risk for excess tissue strain and discomfort when possible. Pt closely monitored throughout session pt response and to maximize patient safety during interventions. Pt continues to demonstrate progress toward goals AEB progression of interventions this date either in volume or intensity.   OBJECTIVE IMPAIRMENTS: decreased endurance, decreased mobility, decreased ROM, decreased strength, hypomobility, impaired flexibility, impaired UE  functional use, postural dysfunction, and pain.   ACTIVITY LIMITATIONS: carrying, lifting, sleeping, bathing, toileting, dressing, reach over head, and hygiene/grooming  PARTICIPATION LIMITATIONS: meal prep, cleaning, laundry, driving, and community activity  PERSONAL FACTORS: Age, Past/current experiences, and Time since onset of injury/illness/exacerbation are also affecting patient's functional outcome.   REHAB POTENTIAL: Good  CLINICAL DECISION MAKING: Stable/uncomplicated  EVALUATION COMPLEXITY: Moderate  GOALS: Goals reviewed with patient? Yes  SHORT TERM GOALS: Target date: 09/13/2023  Patient will be independent in HEP to improve strength/mobility for better functional independence with ADLs. Baseline: 3/12: HEP initiated  Goal status: INITIAL   LONG TERM GOALS: Target date: 10/25/2023   Patient will decrease Quick DASH score by > 15 points demonstrating reduced  self-reported upper extremity disability. Baseline: 3/12: 70.5% Goal status: INITIAL  2.  Patient will improve shoulder AROM to > 120 degrees of flexion, scaption, and abduction for improved ability to perform overhead activities. Baseline: 3/12: see chart above -currently only PROM per protocol Goal status: INITIAL  3.  Patient will improve R UE strength to >4/5 MMT to demonstrate ability to complete ADLs and other functional tasks with R UE for reaching overhead.  Baseline: 3/12: no AROM per protocol Goal status: INITIAL  4.  Patient will report a worst pain of <3/10 on NRPS in R UE to improve tolerance with ADLs and reduced symptoms with activities.  Baseline: 3/12: 6-7/10 pain Goal status: INITIAL   PLAN:  PT FREQUENCY: 1-2x/week  PT DURATION: 12 weeks  PLANNED INTERVENTIONS: 97164- PT Re-evaluation, 97110-Therapeutic exercises, 97530- Therapeutic activity, 97112- Neuromuscular re-education, 97535- Self Care, 16109- Manual therapy, G0283- Electrical stimulation (unattended), [phone]- Electrical stimulation (manual), Patient/Family education, Taping, Dry Needling, Joint mobilization, Joint manipulation, Scar mobilization, DME instructions, Cryotherapy, and Moist heat  PLAN FOR NEXT SESSION: HEP review, continue PROM   Norman Herrlich PT ,DPT Physical Therapist- [place]  [facility]  08/17/2023, 1:30 PM

## 2023-08-22 ENCOUNTER — Ambulatory Visit: Payer: Medicare Other | Attending: Physician Assistant | Admitting: Physical Therapy

## 2023-08-22 DIAGNOSIS — M25611 Stiffness of right shoulder, not elsewhere classified: Secondary | ICD-10-CM | POA: Diagnosis present

## 2023-08-22 DIAGNOSIS — R2681 Unsteadiness on feet: Secondary | ICD-10-CM | POA: Insufficient documentation

## 2023-08-22 DIAGNOSIS — M25511 Pain in right shoulder: Secondary | ICD-10-CM | POA: Insufficient documentation

## 2023-08-22 DIAGNOSIS — M6281 Muscle weakness (generalized): Secondary | ICD-10-CM | POA: Diagnosis present

## 2023-08-22 DIAGNOSIS — R262 Difficulty in walking, not elsewhere classified: Secondary | ICD-10-CM | POA: Insufficient documentation

## 2023-08-22 NOTE — Therapy (Signed)
 OUTPATIENT PHYSICAL THERAPY TREATMENT   Patient Name: Vincent Cole MRN: [id number] DOB:10-06-1950, 73 y.o., male Today's Date: 08/22/2023  END OF SESSION:  PT End of Session - 08/22/23 1328     Visit Number 7    Number of Visits 25    Date for PT Re-Evaluation 10/25/23    Authorization Type Medicare; BCBS fed secondary    Progress Note Due on Visit 10    PT Start Time 1317    PT Stop Time 1357    PT Time Calculation (min) 40 min    Equipment Utilized During Treatment Gait belt    Activity Tolerance Patient tolerated treatment well;No increased pain    Behavior During Therapy [facility] for tasks assessed/performed              Past Medical History:  Diagnosis Date   Abdominal pain 01/17/2011   Formatting of this note might be different from the original.  Note: Unchanged - chronic  suprapubic/lower abdominal area since TURP in 2005; urology has now diagnosed, after extensive evaluation, interstitial cystitis; has also had repeat TURP in June 2010, and a surgery on the neck of the bladder in July of 2011     Actinic keratosis    Arthritis    Benign bladder mass    Complete tear of left rotator cuff 04/19/2017   Complication of anesthesia    PONV x 1 in 2010   Depression    Frequency of urination    GERD (gastroesophageal reflux disease) no meds   Heart murmur    was told many years ago   History of DVT of lower extremity 02/03/2020   History of recurrent UTIs    Hypertension    IC (interstitial cystitis)    Nocturia    OSA on CPAP nightly   Pelvic pain in male    PONV (postoperative nausea and vomiting)    Simple renal cyst    Urgency of urination    Urinary hesitancy    Past Surgical History:  Procedure Laterality Date   BUNIONECTOMY Right 01/15/2021   Procedure: Flavia Shipper;  Surgeon: Gwyneth Revels, DPM;  Location: [facility];  Service: Podiatry;  Laterality: Right;   COLONOSCOPY     COLONOSCOPY WITH PROPOFOL N/A 06/25/2021   Procedure: COLONOSCOPY  WITH PROPOFOL;  Surgeon: Regis Bill, MD;  Location: [facility];  Service: Endoscopy;  Laterality: N/A;   CORRECTION OVERLAPPING TOES Right 01/15/2021   Procedure: CORRECTION OVERLAPPING TOES;  Surgeon: Gwyneth Revels, DPM;  Location: [facility];  Service: Podiatry;  Laterality: Right;   CYSTO WITH HYDRODISTENSION  06/02/2011   Procedure: CYSTOSCOPY/HYDRODISTENSION;  Surgeon: Martina Sinner, MD;  Location: [facility];  Service: Urology;  Laterality: N/A;  catheter placement   HAMMER TOE SURGERY Right 01/15/2021   Procedure: HAMMER TOE CORRECTION;  Surgeon: Gwyneth Revels, DPM;  Location: [facility];  Service: Podiatry;  Laterality: Right;   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   JOINT REPLACEMENT     shoulder, left knee   KNEE ARTHROSCOPY     TRANSURETHRAL RESECTION OF PROSTATE  X3   LAST ONE MAY 2012   WEIL OSTEOTOMY Right 01/15/2021   Procedure: WEIL;  Surgeon: Gwyneth Revels, DPM;  Location: [facility];  Service: Podiatry;  Laterality: Right;   Patient Active Problem List   Diagnosis Date Noted   GAD (generalized anxiety disorder) 06/05/2023   Low back pain 08/04/2022   Gastroesophageal reflux disease without esophagitis 07/22/2022   Leg pain 06/19/2022  BMI 36.0-36.9,adult 11/17/2020   Bilateral lower extremity edema 02/03/2020   DDD (degenerative disc disease), lumbosacral 06/22/2018   Lumbar spondylosis 06/22/2018   Pain in limb 10/20/2017   Arthritis 08/02/2017   Insomnia 08/02/2017   OSA (obstructive sleep apnea) 08/02/2017   BNC (bladder neck contracture) 07/31/2017   Frontal lobe deficit 11/22/2016   Essential hypertension, benign 05/21/2015   Arthritis of left knee 05/21/2015   Interstitial cystitis 07/04/2011   Disorder of skin and subcutaneous tissue 04/04/2011   Hyperlipidemia 04/04/2011   Benign prostatic hyperplasia with urinary obstruction 01/17/2011   Scoliosis 01/17/2011    PCP: Meliton Rattan, MD  REFERRING PROVIDER:  Altamese Cabal, PA-C   REFERRING DIAG: [phone] (ICD-10-CM) - Presence of right artificial shoulder joint  THERAPY DIAG:  Acute pain of right shoulder  Muscle weakness (generalized)  Stiffness of right shoulder, not elsewhere classified  Rationale for Evaluation and Treatment: Rehabilitation  ONSET DATE: 07/27/2023  SUBJECTIVE:                                                                                                                                                                                      SUBJECTIVE STATEMENT: Pt denies any pertinent updates since last visit. New HEP going well, still minimal pain  PERTINENT HISTORY: S/p R reverse TSA on 07/27/2023 Recently being seen in this clinic for balance and LE weakness and discharge 07/21/23.   Hand dominance: Right PAIN:  Are you having pain? Yes 2-3/10 "not bad"   PRECAUTIONS: Shoulder  WEIGHT BEARING RESTRICTIONS: Yes NWB   FALLS:  Has patient fallen in last 6 months? No  LIVING ENVIRONMENT: Lives with: lives alone - daughter is currently staying with him for a couple of weeks Lives in: House/apartment Has following equipment at home: None  OCCUPATION: Retired   PLOF: Independent  PATIENT GOALS: to be able to do what he needs to do without pain   NEXT MD VISIT:   OBJECTIVE:  Note: Objective measures were completed at Evaluation unless otherwise noted.  DIAGNOSTIC FINDINGS:  Post op imaging not present in chart   PATIENT SURVEYS:  Quick Dash 70.5/100 = 70.5%  COGNITION: Overall cognitive status: Within functional limits for tasks assessed     SENSATION: WFL  POSTURE: Forward head, rounded shoulders   UPPER EXTREMITY ROM:   Passive ROM Right eval Right 08/11/23  Shoulder flexion 105 115  Shoulder extension    Shoulder abduction 75 90  Shoulder adduction    Shoulder internal rotation    Shoulder external rotation 35 105  Elbow flexion    Elbow extension    (Blank rows = not  tested)  PALPATION:  Significant  bruising noted to entire R upper arm from elbow to shoulder  General tenderness to incision which is covered by waterproof bandage                                                                                                                       TODAY'S TREATMENT: 08/22/23 TE -pt doffs sling without assist -supine RUE PROM 5x60sec each flexion, ABDCT, ER in scapular plane  Supine isometric shoulder extension 2 x 10  Supine isometric shoulder ABD 2 x 10  Supine isometric shoulder flexion 2 x 10  Supine shoulder IR and ER 2 x 10 ea, cues for proper completion required  Supine shoulder press with PVC dowel 2 x 10  Supine AAROM flexion 2 x 10 with PVC dowel  Supine AAROM flexion with end range hold x 10 x 10 sec  Sitting UE ranger x 20 flex, hor abd/ add, CCW, CW   PATIENT EDUCATION: Education details: HEP, POC, goals  Person educated: Patient Education method: Explanation, Demonstration, and Handouts Education comprehension: verbalized understanding and returned demonstration  HOME EXERCISE PROGRAM:  Access Code: [email or web address] URL: [email or web address] Date: 08/11/2023 Prepared by: Alvera Novel  Exercises - Seated Shoulder Pendulum Exercise  - 3 x daily - 1 sets - 15 reps - 5 hold - Standing Elbow Flexion Extension AROM  - 5 x daily - 1 sets - 15 reps - Seated Scapular Retraction  - 1 x daily - 3 sets - 10 reps - 3sec hold - Standing Forearm Pronation and Supination AROM  - 1 x daily - 3 sets - 10 reps - Wrist Circumduction AROM  - 5 x daily - 1 sets - 15 reps - Seated Gripping Towel  - 5 x daily - 1 sets - 10 reps - 5sec hold  Access Code: [email or web address] URL: [email or web address] Date: 08/02/2023 Prepared by: Maylon Peppers  Exercises - Circular Shoulder Pendulum with Table Support  - 2-3 x daily - 5-7 x weekly - 2-3 sets - 10 reps - Seated Shoulder Flexion Towel Slide at Table Top  - 2-3 x daily - 5-7 x weekly - 3  sets - 10 reps - Seated Shoulder Abduction Towel Slide at Table Top  - 2-3 x daily - 5-7 x weekly - 3 sets - 10 reps  ASSESSMENT:  CLINICAL IMPRESSION: Patient arrived with good motivation for completion of pt activities.  Continued with current plan of care as laid out in evaluation and recent prior sessions. Pt remains motivated to advance progress toward goals in order to maximize independence and safety at home. Pt requires high level assistance and cuing for completion of exercises in order to provide adequate level of stimulation challenge while minimizing pain and risk for excess tissue strain and discomfort when possible. Pt closely monitored throughout session pt response and to maximize patient safety during interventions. Pt continues to demonstrate progress toward goals AEB progression of interventions this date either in volume or intensity.   OBJECTIVE IMPAIRMENTS: decreased endurance, decreased mobility, decreased  ROM, decreased strength, hypomobility, impaired flexibility, impaired UE functional use, postural dysfunction, and pain.   ACTIVITY LIMITATIONS: carrying, lifting, sleeping, bathing, toileting, dressing, reach over head, and hygiene/grooming  PARTICIPATION LIMITATIONS: meal prep, cleaning, laundry, driving, and community activity  PERSONAL FACTORS: Age, Past/current experiences, and Time since onset of injury/illness/exacerbation are also affecting patient's functional outcome.   REHAB POTENTIAL: Good  CLINICAL DECISION MAKING: Stable/uncomplicated  EVALUATION COMPLEXITY: Moderate  GOALS: Goals reviewed with patient? Yes  SHORT TERM GOALS: Target date: 09/13/2023  Patient will be independent in HEP to improve strength/mobility for better functional independence with ADLs. Baseline: 3/12: HEP initiated  Goal status: INITIAL   LONG TERM GOALS: Target date: 10/25/2023   Patient will decrease Quick DASH score by > 15 points demonstrating reduced self-reported  upper extremity disability. Baseline: 3/12: 70.5% Goal status: INITIAL  2.  Patient will improve shoulder AROM to > 120 degrees of flexion, scaption, and abduction for improved ability to perform overhead activities. Baseline: 3/12: see chart above -currently only PROM per protocol Goal status: INITIAL  3.  Patient will improve R UE strength to >4/5 MMT to demonstrate ability to complete ADLs and other functional tasks with R UE for reaching overhead.  Baseline: 3/12: no AROM per protocol Goal status: INITIAL  4.  Patient will report a worst pain of <3/10 on NRPS in R UE to improve tolerance with ADLs and reduced symptoms with activities.  Baseline: 3/12: 6-7/10 pain Goal status: INITIAL   PLAN:  PT FREQUENCY: 1-2x/week  PT DURATION: 12 weeks  PLANNED INTERVENTIONS: 97164- PT Re-evaluation, 97110-Therapeutic exercises, 97530- Therapeutic activity, 97112- Neuromuscular re-education, 97535- Self Care, 16109- Manual therapy, G0283- Electrical stimulation (unattended), [phone]- Electrical stimulation (manual), Patient/Family education, Taping, Dry Needling, Joint mobilization, Joint manipulation, Scar mobilization, DME instructions, Cryotherapy, and Moist heat  PLAN FOR NEXT SESSION: HEP review, continue PROM   Norman Herrlich PT ,DPT Physical Therapist- [facility]  [facility]  08/22/2023, 1:29 PM

## 2023-08-24 ENCOUNTER — Ambulatory Visit: Payer: Medicare Other | Admitting: Physical Therapy

## 2023-08-24 DIAGNOSIS — M25511 Pain in right shoulder: Secondary | ICD-10-CM | POA: Diagnosis not present

## 2023-08-24 DIAGNOSIS — R2681 Unsteadiness on feet: Secondary | ICD-10-CM

## 2023-08-24 DIAGNOSIS — M6281 Muscle weakness (generalized): Secondary | ICD-10-CM

## 2023-08-24 DIAGNOSIS — M25611 Stiffness of right shoulder, not elsewhere classified: Secondary | ICD-10-CM

## 2023-08-24 DIAGNOSIS — R262 Difficulty in walking, not elsewhere classified: Secondary | ICD-10-CM

## 2023-08-24 NOTE — Therapy (Signed)
 OUTPATIENT PHYSICAL THERAPY TREATMENT   Patient Name: Vincent Cole MRN: [id number] DOB:March 22, 1951, 73 y.o., male Today's Date: 08/24/2023  END OF SESSION:  PT End of Session - 08/24/23 1325     Visit Number 8    Number of Visits 25    Date for PT Re-Evaluation 10/25/23    Authorization Type Medicare; BCBS fed secondary    Progress Note Due on Visit 10    PT Start Time 1317    PT Stop Time 1356    PT Time Calculation (min) 39 min    Equipment Utilized During Treatment Gait belt    Activity Tolerance Patient tolerated treatment well;No increased pain    Behavior During Therapy [facility] for tasks assessed/performed              Past Medical History:  Diagnosis Date   Abdominal pain 01/17/2011   Formatting of this note might be different from the original.  Note: Unchanged - chronic  suprapubic/lower abdominal area since TURP in 2005; urology has now diagnosed, after extensive evaluation, interstitial cystitis; has also had repeat TURP in June 2010, and a surgery on the neck of the bladder in July of 2011     Actinic keratosis    Arthritis    Benign bladder mass    Complete tear of left rotator cuff 04/19/2017   Complication of anesthesia    PONV x 1 in 2010   Depression    Frequency of urination    GERD (gastroesophageal reflux disease) no meds   Heart murmur    was told many years ago   History of DVT of lower extremity 02/03/2020   History of recurrent UTIs    Hypertension    IC (interstitial cystitis)    Nocturia    OSA on CPAP nightly   Pelvic pain in male    PONV (postoperative nausea and vomiting)    Simple renal cyst    Urgency of urination    Urinary hesitancy    Past Surgical History:  Procedure Laterality Date   BUNIONECTOMY Right 01/15/2021   Procedure: Flavia Shipper;  Surgeon: Gwyneth Revels, DPM;  Location: [facility];  Service: Podiatry;  Laterality: Right;   COLONOSCOPY     COLONOSCOPY WITH PROPOFOL N/A 06/25/2021   Procedure: COLONOSCOPY  WITH PROPOFOL;  Surgeon: Regis Bill, MD;  Location: [facility];  Service: Endoscopy;  Laterality: N/A;   CORRECTION OVERLAPPING TOES Right 01/15/2021   Procedure: CORRECTION OVERLAPPING TOES;  Surgeon: Gwyneth Revels, DPM;  Location: [facility];  Service: Podiatry;  Laterality: Right;   CYSTO WITH HYDRODISTENSION  06/02/2011   Procedure: CYSTOSCOPY/HYDRODISTENSION;  Surgeon: Martina Sinner, MD;  Location: [facility];  Service: Urology;  Laterality: N/A;  catheter placement   HAMMER TOE SURGERY Right 01/15/2021   Procedure: HAMMER TOE CORRECTION;  Surgeon: Gwyneth Revels, DPM;  Location: [facility];  Service: Podiatry;  Laterality: Right;   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   JOINT REPLACEMENT     shoulder, left knee   KNEE ARTHROSCOPY     TRANSURETHRAL RESECTION OF PROSTATE  X3   LAST ONE MAY 2012   WEIL OSTEOTOMY Right 01/15/2021   Procedure: WEIL;  Surgeon: Gwyneth Revels, DPM;  Location: [facility];  Service: Podiatry;  Laterality: Right;   Patient Active Problem List   Diagnosis Date Noted   GAD (generalized anxiety disorder) 06/05/2023   Low back pain 08/04/2022   Gastroesophageal reflux disease without esophagitis 07/22/2022   Leg pain 06/19/2022  BMI 36.0-36.9,adult 11/17/2020   Bilateral lower extremity edema 02/03/2020   DDD (degenerative disc disease), lumbosacral 06/22/2018   Lumbar spondylosis 06/22/2018   Pain in limb 10/20/2017   Arthritis 08/02/2017   Insomnia 08/02/2017   OSA (obstructive sleep apnea) 08/02/2017   BNC (bladder neck contracture) 07/31/2017   Frontal lobe deficit 11/22/2016   Essential hypertension, benign 05/21/2015   Arthritis of left knee 05/21/2015   Interstitial cystitis 07/04/2011   Disorder of skin and subcutaneous tissue 04/04/2011   Hyperlipidemia 04/04/2011   Benign prostatic hyperplasia with urinary obstruction 01/17/2011   Scoliosis 01/17/2011    PCP: Meliton Rattan, MD  REFERRING PROVIDER:  Altamese Cabal, PA-C   REFERRING DIAG: [id number] (ICD-10-CM) - Presence of right artificial shoulder joint  THERAPY DIAG:  Acute pain of right shoulder  Muscle weakness (generalized)  Stiffness of right shoulder, not elsewhere classified  Unsteadiness on feet  Difficulty in walking, not elsewhere classified  Rationale for Evaluation and Treatment: Rehabilitation  ONSET DATE: 07/27/2023  SUBJECTIVE:                                                                                                                                                                                      SUBJECTIVE STATEMENT: Pt denies any pertinent updates since last visit. New HEP going well, still minimal pain  PERTINENT HISTORY: S/p R reverse TSA on 07/27/2023 Recently being seen in this clinic for balance and LE weakness and discharge 07/21/23.   Hand dominance: Right PAIN:  Are you having pain? Yes 2-3/10 "not bad"   PRECAUTIONS: Shoulder  WEIGHT BEARING RESTRICTIONS: Yes NWB   FALLS:  Has patient fallen in last 6 months? No  LIVING ENVIRONMENT: Lives with: lives alone - daughter is currently staying with him for a couple of weeks Lives in: House/apartment Has following equipment at home: None  OCCUPATION: Retired   PLOF: Independent  PATIENT GOALS: to be able to do what he needs to do without pain   NEXT MD VISIT:   OBJECTIVE:  Note: Objective measures were completed at Evaluation unless otherwise noted.  DIAGNOSTIC FINDINGS:  Post op imaging not present in chart   PATIENT SURVEYS:  Quick Dash 70.5/100 = 70.5%  COGNITION: Overall cognitive status: Within functional limits for tasks assessed     SENSATION: WFL  POSTURE: Forward head, rounded shoulders   UPPER EXTREMITY ROM:   Passive ROM Right eval Right 08/11/23  Shoulder flexion 105 115  Shoulder extension    Shoulder abduction 75 90  Shoulder adduction    Shoulder internal rotation    Shoulder external rotation 35  105  Elbow flexion    Elbow extension    (  Blank rows = not tested)  PALPATION:  Significant bruising noted to entire R upper arm from elbow to shoulder  General tenderness to incision which is covered by waterproof bandage                                                                                                                       TODAY'S TREATMENT: 08/24/23 TE -pt doffs sling without assist -supine RUE PROM 2x60sec each flexion, ABDCT, ER in scapular plane  Supine shoulder IR and ER 2 x 10 ea, cues for proper completion required  Supine shoulder press with PVC dowel  x 10   -2 x 10 with 2.5# bar  Supine AAROM flexion 2 x 10 2.5# bar Supine AAROM flexion with end range hold x 10 x 10 sec  Sitting UE ranger x 20 flex, hor abd/ add, CCW, CW Standing   isometric shoulder extension 2 x 10  isometric shoulder ABD 2 x 10  isometric shoulder flexion 2 x 10   PATIENT EDUCATION: Education details: HEP, POC, goals  Person educated: Patient Education method: Explanation, Demonstration, and Handouts Education comprehension: verbalized understanding and returned demonstration  HOME EXERCISE PROGRAM:  Access Code: [email or web address] URL: [email or web address] Date: 08/24/2023 Prepared by: Thresa Ross  Exercises - Seated Scapular Retraction  - 1 x daily - 3 sets - 10 reps - 3sec hold - Isometric Shoulder Extension at Wall  - 1 x daily - 7 x weekly - 2 sets - 10 reps - 3 sec hold - Isometric Shoulder Abduction at Wall  - 1 x daily - 7 x weekly - 2 sets - 10 reps - 3 sec hold - Isometric Shoulder Flexion at Wall  - 1 x daily - 7 x weekly - 3 sets - 10 reps - Shoulder Flexion Wall Slide with Towel  - 1 x daily - 7 x weekly - 1 sets - 10 reps - 5 sec  hold  ASSESSMENT:  CLINICAL IMPRESSION: Patient arrived with good motivation for completion of pt activities.  Continued with current plan of care as laid out in evaluation and recent prior sessions. Pt remains motivated to  advance progress toward goals in order to maximize independence and safety at home. Pt requires high level assistance and cuing for completion of exercises in order to provide adequate level of stimulation challenge while minimizing pain and risk for excess tissue strain and discomfort when possible. Pt progressed with independent completion of isometric and added to HEP.  Pt continues to demonstrate progress toward goals AEB progression of interventions this date either in volume or intensity.   OBJECTIVE IMPAIRMENTS: decreased endurance, decreased mobility, decreased ROM, decreased strength, hypomobility, impaired flexibility, impaired UE functional use, postural dysfunction, and pain.   ACTIVITY LIMITATIONS: carrying, lifting, sleeping, bathing, toileting, dressing, reach over head, and hygiene/grooming  PARTICIPATION LIMITATIONS: meal prep, cleaning, laundry, driving, and community activity  PERSONAL FACTORS: Age, Past/current experiences, and Time since onset of injury/illness/exacerbation are also affecting patient's functional outcome.   REHAB  POTENTIAL: Good  CLINICAL DECISION MAKING: Stable/uncomplicated  EVALUATION COMPLEXITY: Moderate  GOALS: Goals reviewed with patient? Yes  SHORT TERM GOALS: Target date: 09/13/2023  Patient will be independent in HEP to improve strength/mobility for better functional independence with ADLs. Baseline: 3/12: HEP initiated  Goal status: INITIAL   LONG TERM GOALS: Target date: 10/25/2023   Patient will decrease Quick DASH score by > 15 points demonstrating reduced self-reported upper extremity disability. Baseline: 3/12: 70.5% Goal status: INITIAL  2.  Patient will improve shoulder AROM to > 120 degrees of flexion, scaption, and abduction for improved ability to perform overhead activities. Baseline: 3/12: see chart above -currently only PROM per protocol Goal status: INITIAL  3.  Patient will improve R UE strength to >4/5 MMT to  demonstrate ability to complete ADLs and other functional tasks with R UE for reaching overhead.  Baseline: 3/12: no AROM per protocol Goal status: INITIAL  4.  Patient will report a worst pain of <3/10 on NRPS in R UE to improve tolerance with ADLs and reduced symptoms with activities.  Baseline: 3/12: 6-7/10 pain Goal status: INITIAL   PLAN:  PT FREQUENCY: 1-2x/week  PT DURATION: 12 weeks  PLANNED INTERVENTIONS: 97164- PT Re-evaluation, 97110-Therapeutic exercises, 97530- Therapeutic activity, 97112- Neuromuscular re-education, 97535- Self Care, 09811- Manual therapy, G0283- Electrical stimulation (unattended), [phone]- Electrical stimulation (manual), Patient/Family education, Taping, Dry Needling, Joint mobilization, Joint manipulation, Scar mobilization, DME instructions, Cryotherapy, and Moist heat  PLAN FOR NEXT SESSION: HEP review, continue POC and gentle progression   Norman Herrlich PT ,DPT Physical Therapist- [place]  [facility]  08/24/2023, 1:27 PM

## 2023-08-29 ENCOUNTER — Ambulatory Visit: Payer: Medicare Other | Admitting: Physical Therapy

## 2023-08-29 DIAGNOSIS — M6281 Muscle weakness (generalized): Secondary | ICD-10-CM

## 2023-08-29 DIAGNOSIS — M25611 Stiffness of right shoulder, not elsewhere classified: Secondary | ICD-10-CM

## 2023-08-29 DIAGNOSIS — M25511 Pain in right shoulder: Secondary | ICD-10-CM | POA: Diagnosis not present

## 2023-08-29 NOTE — Therapy (Signed)
 OUTPATIENT PHYSICAL THERAPY TREATMENT   Patient Name: Welcome Fults MRN: [id number] DOB:1950/07/04, 73 y.o., male Today's Date: 08/29/2023  END OF SESSION:  PT End of Session - 08/29/23 1311     Visit Number 9    Number of Visits 25    Date for PT Re-Evaluation 10/25/23    Authorization Type Medicare; BCBS fed secondary    Progress Note Due on Visit 10    PT Start Time 1315    PT Stop Time 1356    PT Time Calculation (min) 41 min    Equipment Utilized During Treatment Gait belt    Activity Tolerance Patient tolerated treatment well;No increased pain    Behavior During Therapy [facility] for tasks assessed/performed               Past Medical History:  Diagnosis Date   Abdominal pain 01/17/2011   Formatting of this note might be different from the original.  Note: Unchanged - chronic  suprapubic/lower abdominal area since TURP in 2005; urology has now diagnosed, after extensive evaluation, interstitial cystitis; has also had repeat TURP in June 2010, and a surgery on the neck of the bladder in July of 2011     Actinic keratosis    Arthritis    Benign bladder mass    Complete tear of left rotator cuff 04/19/2017   Complication of anesthesia    PONV x 1 in 2010   Depression    Frequency of urination    GERD (gastroesophageal reflux disease) no meds   Heart murmur    was told many years ago   History of DVT of lower extremity 02/03/2020   History of recurrent UTIs    Hypertension    IC (interstitial cystitis)    Nocturia    OSA on CPAP nightly   Pelvic pain in male    PONV (postoperative nausea and vomiting)    Simple renal cyst    Urgency of urination    Urinary hesitancy    Past Surgical History:  Procedure Laterality Date   BUNIONECTOMY Right 01/15/2021   Procedure: Flavia Shipper;  Surgeon: Gwyneth Revels, DPM;  Location: [facility];  Service: Podiatry;  Laterality: Right;   COLONOSCOPY     COLONOSCOPY WITH PROPOFOL N/A 06/25/2021   Procedure:  COLONOSCOPY WITH PROPOFOL;  Surgeon: Regis Bill, MD;  Location: [facility];  Service: Endoscopy;  Laterality: N/A;   CORRECTION OVERLAPPING TOES Right 01/15/2021   Procedure: CORRECTION OVERLAPPING TOES;  Surgeon: Gwyneth Revels, DPM;  Location: [facility];  Service: Podiatry;  Laterality: Right;   CYSTO WITH HYDRODISTENSION  06/02/2011   Procedure: CYSTOSCOPY/HYDRODISTENSION;  Surgeon: Martina Sinner, MD;  Location: [facility];  Service: Urology;  Laterality: N/A;  catheter placement   HAMMER TOE SURGERY Right 01/15/2021   Procedure: HAMMER TOE CORRECTION;  Surgeon: Gwyneth Revels, DPM;  Location: [facility];  Service: Podiatry;  Laterality: Right;   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   JOINT REPLACEMENT     shoulder, left knee   KNEE ARTHROSCOPY     TRANSURETHRAL RESECTION OF PROSTATE  X3   LAST ONE MAY 2012   WEIL OSTEOTOMY Right 01/15/2021   Procedure: WEIL;  Surgeon: Gwyneth Revels, DPM;  Location: [facility];  Service: Podiatry;  Laterality: Right;   Patient Active Problem List   Diagnosis Date Noted   GAD (generalized anxiety disorder) 06/05/2023   Low back pain 08/04/2022   Gastroesophageal reflux disease without esophagitis 07/22/2022   Leg pain 06/19/2022  BMI 36.0-36.9,adult 11/17/2020   Bilateral lower extremity edema 02/03/2020   DDD (degenerative disc disease), lumbosacral 06/22/2018   Lumbar spondylosis 06/22/2018   Pain in limb 10/20/2017   Arthritis 08/02/2017   Insomnia 08/02/2017   OSA (obstructive sleep apnea) 08/02/2017   BNC (bladder neck contracture) 07/31/2017   Frontal lobe deficit 11/22/2016   Essential hypertension, benign 05/21/2015   Arthritis of left knee 05/21/2015   Interstitial cystitis 07/04/2011   Disorder of skin and subcutaneous tissue 04/04/2011   Hyperlipidemia 04/04/2011   Benign prostatic hyperplasia with urinary obstruction 01/17/2011   Scoliosis 01/17/2011    PCP: Meliton Rattan, MD  REFERRING  PROVIDER: Altamese Cabal, PA-C   REFERRING DIAG: [phone] (ICD-10-CM) - Presence of right artificial shoulder joint  THERAPY DIAG:  Stiffness of right shoulder, not elsewhere classified  Muscle weakness (generalized)  Acute pain of right shoulder  Rationale for Evaluation and Treatment: Rehabilitation  ONSET DATE: 07/27/2023  SUBJECTIVE:                                                                                                                                                                                      SUBJECTIVE STATEMENT:  Pt reports some discomfort in his R UE after accidentally using it to lift a pot when helping his son cook. Pt localizes discomfort to the lateral proximal deltoid region.   PERTINENT HISTORY: S/p R reverse TSA on 07/27/2023 Recently being seen in this clinic for balance and LE weakness and discharge 07/21/23.   Hand dominance: Right PAIN:  Are you having pain? Yes 2-3/10 "not bad"   PRECAUTIONS: Shoulder  WEIGHT BEARING RESTRICTIONS: Yes NWB   FALLS:  Has patient fallen in last 6 months? No  LIVING ENVIRONMENT: Lives with: lives alone - daughter is currently staying with him for a couple of weeks Lives in: House/apartment Has following equipment at home: None  OCCUPATION: Retired   PLOF: Independent  PATIENT GOALS: to be able to do what he needs to do without pain   NEXT MD VISIT:   OBJECTIVE:  Note: Objective measures were completed at Evaluation unless otherwise noted.  DIAGNOSTIC FINDINGS:  Post op imaging not present in chart   PATIENT SURVEYS:  Quick Dash 70.5/100 = 70.5%  COGNITION: Overall cognitive status: Within functional limits for tasks assessed     SENSATION: WFL  POSTURE: Forward head, rounded shoulders   UPPER EXTREMITY ROM:   Passive ROM Right eval Right 08/11/23  Shoulder flexion 105 115  Shoulder extension    Shoulder abduction 75 90  Shoulder adduction    Shoulder internal rotation    Shoulder  external rotation 35 105  Elbow flexion  Elbow extension    (Blank rows = not tested)  PALPATION:  Significant bruising noted to entire R upper arm from elbow to shoulder  General tenderness to incision which is covered by waterproof bandage                                                                                                                       TODAY'S TREATMENT: 08/29/23 Manual Ischemic TP release to the R lateral proximal deltoid and STM to ease discomfort in this area   TE -pt doffs sling without assist Supine AAROM with dowel 2 x 10 x 5 sec holds  Supine R ER AAROM with dowel 2 x 10 x 5 sec  Supine shoulder press w  -2 x 10 with 4# bar  Standing biceps curl 2 x 15 with 4# bar ( bilateral curl)  Standing UE ranger into flexion 2 x 10  Standing UE ranger Hor ABD/ ADD 2 x 10  Wall ladder x 6 reps x 5 sec hold  Seated shoulder row with RTB 2 x 10 reps, cues to not go too far into shoulder extension    PATIENT EDUCATION: Education details: HEP, POC, goals  Person educated: Patient Education method: Explanation, Demonstration, and Handouts Education comprehension: verbalized understanding and returned demonstration  HOME EXERCISE PROGRAM:  Access Code: [email or web address] URL: [email or web address] Date: 08/24/2023 Prepared by: Thresa Ross  Exercises - Seated Scapular Retraction  - 1 x daily - 3 sets - 10 reps - 3sec hold - Isometric Shoulder Extension at Wall  - 1 x daily - 7 x weekly - 2 sets - 10 reps - 3 sec hold - Isometric Shoulder Abduction at Wall  - 1 x daily - 7 x weekly - 2 sets - 10 reps - 3 sec hold - Isometric Shoulder Flexion at Wall  - 1 x daily - 7 x weekly - 3 sets - 10 reps - Shoulder Flexion Wall Slide with Towel  - 1 x daily - 7 x weekly - 1 sets - 10 reps - 5 sec  hold  ASSESSMENT:  CLINICAL IMPRESSION:  Patient arrived with good motivation for completion of pt activities.  STM provided to address some discomfort in the  lateral deltoid following overuse over the weekend. Pt instructed to continue to minimize use of R UE for functional activities until MD allows everyday activities without sling. Continued with current plan of care as laid out in evaluation and recent prior sessions. Pt remains motivated to advance progress toward goals in order to maximize independence and safety at home. Pt requires high level assistance and cuing for completion of exercises in order to provide adequate level of stimulation challenge while minimizing pain and risk for excess tissue strain and discomfort when possible.  Pt continues to demonstrate progress toward goals AEB progression of interventions this date either in volume or intensity.   OBJECTIVE IMPAIRMENTS: decreased endurance, decreased mobility, decreased ROM, decreased strength, hypomobility, impaired flexibility, impaired UE functional use, postural dysfunction, and  pain.   ACTIVITY LIMITATIONS: carrying, lifting, sleeping, bathing, toileting, dressing, reach over head, and hygiene/grooming  PARTICIPATION LIMITATIONS: meal prep, cleaning, laundry, driving, and community activity  PERSONAL FACTORS: Age, Past/current experiences, and Time since onset of injury/illness/exacerbation are also affecting patient's functional outcome.   REHAB POTENTIAL: Good  CLINICAL DECISION MAKING: Stable/uncomplicated  EVALUATION COMPLEXITY: Moderate  GOALS: Goals reviewed with patient? Yes  SHORT TERM GOALS: Target date: 09/13/2023  Patient will be independent in HEP to improve strength/mobility for better functional independence with ADLs. Baseline: 3/12: HEP initiated  Goal status: INITIAL   LONG TERM GOALS: Target date: 10/25/2023   Patient will decrease Quick DASH score by > 15 points demonstrating reduced self-reported upper extremity disability. Baseline: 3/12: 70.5% Goal status: INITIAL  2.  Patient will improve shoulder AROM to > 120 degrees of flexion, scaption, and  abduction for improved ability to perform overhead activities. Baseline: 3/12: see chart above -currently only PROM per protocol Goal status: INITIAL  3.  Patient will improve R UE strength to >4/5 MMT to demonstrate ability to complete ADLs and other functional tasks with R UE for reaching overhead.  Baseline: 3/12: no AROM per protocol Goal status: INITIAL  4.  Patient will report a worst pain of <3/10 on NRPS in R UE to improve tolerance with ADLs and reduced symptoms with activities.  Baseline: 3/12: 6-7/10 pain Goal status: INITIAL   PLAN:  PT FREQUENCY: 1-2x/week  PT DURATION: 12 weeks  PLANNED INTERVENTIONS: 97164- PT Re-evaluation, 97110-Therapeutic exercises, 97530- Therapeutic activity, 97112- Neuromuscular re-education, 97535- Self Care, 16109- Manual therapy, G0283- Electrical stimulation (unattended), [phone]- Electrical stimulation (manual), Patient/Family education, Taping, Dry Needling, Joint mobilization, Joint manipulation, Scar mobilization, DME instructions, Cryotherapy, and Moist heat  PLAN FOR NEXT SESSION: HEP review, continue POC and gentle progression   Norman Herrlich PT ,DPT Physical Therapist- [place]  [facility]  08/29/2023, 1:11 PM

## 2023-08-31 ENCOUNTER — Ambulatory Visit: Payer: Medicare Other | Admitting: Physical Therapy

## 2023-08-31 NOTE — Therapy (Deleted)
 OUTPATIENT PHYSICAL THERAPY TREATMENT/ Physical Therapy Progress Note   Dates of reporting period  ***   to   ***   Patient Name: Vincent Cole MRN: [id number] DOB:03-20-1951, 73 y.o., male Today's Date: 08/31/2023  END OF SESSION:      Past Medical History:  Diagnosis Date   Abdominal pain 01/17/2011   Formatting of this note might be different from the original.  Note: Unchanged - chronic  suprapubic/lower abdominal area since TURP in 2005; urology has now diagnosed, after extensive evaluation, interstitial cystitis; has also had repeat TURP in June 2010, and a surgery on the neck of the bladder in July of 2011     Actinic keratosis    Arthritis    Benign bladder mass    Complete tear of left rotator cuff 04/19/2017   Complication of anesthesia    PONV x 1 in 2010   Depression    Frequency of urination    GERD (gastroesophageal reflux disease) no meds   Heart murmur    was told many years ago   History of DVT of lower extremity 02/03/2020   History of recurrent UTIs    Hypertension    IC (interstitial cystitis)    Nocturia    OSA on CPAP nightly   Pelvic pain in male    PONV (postoperative nausea and vomiting)    Simple renal cyst    Urgency of urination    Urinary hesitancy    Past Surgical History:  Procedure Laterality Date   BUNIONECTOMY Right 01/15/2021   Procedure: Flavia Shipper;  Surgeon: Gwyneth Revels, DPM;  Location: [facility];  Service: Podiatry;  Laterality: Right;   COLONOSCOPY     COLONOSCOPY WITH PROPOFOL N/A 06/25/2021   Procedure: COLONOSCOPY WITH PROPOFOL;  Surgeon: Regis Bill, MD;  Location: [facility];  Service: Endoscopy;  Laterality: N/A;   CORRECTION OVERLAPPING TOES Right 01/15/2021   Procedure: CORRECTION OVERLAPPING TOES;  Surgeon: Gwyneth Revels, DPM;  Location: [facility];  Service: Podiatry;  Laterality: Right;   CYSTO WITH HYDRODISTENSION  06/02/2011   Procedure: CYSTOSCOPY/HYDRODISTENSION;  Surgeon: Martina Sinner, MD;  Location: [facility];  Service: Urology;  Laterality: N/A;  catheter placement   HAMMER TOE SURGERY Right 01/15/2021   Procedure: HAMMER TOE CORRECTION;  Surgeon: Gwyneth Revels, DPM;  Location: [facility];  Service: Podiatry;  Laterality: Right;   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   JOINT REPLACEMENT     shoulder, left knee   KNEE ARTHROSCOPY     TRANSURETHRAL RESECTION OF PROSTATE  X3   LAST ONE MAY 2012   WEIL OSTEOTOMY Right 01/15/2021   Procedure: WEIL;  Surgeon: Gwyneth Revels, DPM;  Location: [facility];  Service: Podiatry;  Laterality: Right;   Patient Active Problem List   Diagnosis Date Noted   GAD (generalized anxiety disorder) 06/05/2023   Low back pain 08/04/2022   Gastroesophageal reflux disease without esophagitis 07/22/2022   Leg pain 06/19/2022   BMI 36.0-36.9,adult 11/17/2020   Bilateral lower extremity edema 02/03/2020   DDD (degenerative disc disease), lumbosacral 06/22/2018   Lumbar spondylosis 06/22/2018   Pain in limb 10/20/2017   Arthritis 08/02/2017   Insomnia 08/02/2017   OSA (obstructive sleep apnea) 08/02/2017   BNC (bladder neck contracture) 07/31/2017   Frontal lobe deficit 11/22/2016   Essential hypertension, benign 05/21/2015   Arthritis of left knee 05/21/2015   Interstitial cystitis 07/04/2011   Disorder of skin and subcutaneous tissue 04/04/2011   Hyperlipidemia 04/04/2011  Benign prostatic hyperplasia with urinary obstruction 01/17/2011   Scoliosis 01/17/2011    PCP: Meliton Rattan, MD  REFERRING PROVIDER: Altamese Cabal, PA-C   REFERRING DIAG: [phone] (ICD-10-CM) - Presence of right artificial shoulder joint  THERAPY DIAG:  No diagnosis found.  Rationale for Evaluation and Treatment: Rehabilitation  ONSET DATE: 07/27/2023  SUBJECTIVE:                                                                                                                                                                                       SUBJECTIVE STATEMENT:  Pt reports some discomfort in his R UE after accidentally using it to lift a pot when helping his son cook. Pt localizes discomfort to the lateral proximal deltoid region.   PERTINENT HISTORY: S/p R reverse TSA on 07/27/2023 Recently being seen in this clinic for balance and LE weakness and discharge 07/21/23.   Hand dominance: Right PAIN:  Are you having pain? Yes 2-3/10 "not bad"   PRECAUTIONS: Shoulder  WEIGHT BEARING RESTRICTIONS: Yes NWB   FALLS:  Has patient fallen in last 6 months? No  LIVING ENVIRONMENT: Lives with: lives alone - daughter is currently staying with him for a couple of weeks Lives in: House/apartment Has following equipment at home: None  OCCUPATION: Retired   PLOF: Independent  PATIENT GOALS: to be able to do what he needs to do without pain   NEXT MD VISIT:   OBJECTIVE:  Note: Objective measures were completed at Evaluation unless otherwise noted.  DIAGNOSTIC FINDINGS:  Post op imaging not present in chart   PATIENT SURVEYS:  Quick Dash 70.5/100 = 70.5%  COGNITION: Overall cognitive status: Within functional limits for tasks assessed     SENSATION: WFL  POSTURE: Forward head, rounded shoulders   UPPER EXTREMITY ROM:   Passive ROM Right eval Right 08/11/23  Shoulder flexion 105 115  Shoulder extension    Shoulder abduction 75 90  Shoulder adduction    Shoulder internal rotation    Shoulder external rotation 35 105  Elbow flexion    Elbow extension    (Blank rows = not tested)  PALPATION:  Significant bruising noted to entire R upper arm from elbow to shoulder  General tenderness to incision which is covered by waterproof bandage  TODAY'S TREATMENT: 08/31/23 Manual Ischemic TP release to the R lateral proximal deltoid and STM to ease discomfort in this area   TE -pt  doffs sling without assist Supine AAROM with dowel 2 x 10 x 5 sec holds  Supine R ER AAROM with dowel 2 x 10 x 5 sec  Supine shoulder press w  -2 x 10 with 4# bar  Standing biceps curl 2 x 15 with 4# bar ( bilateral curl)  Standing UE ranger into flexion 2 x 10  Standing UE ranger Hor ABD/ ADD 2 x 10  Wall ladder x 6 reps x 5 sec hold  Seated shoulder row with RTB 2 x 10 reps, cues to not go too far into shoulder extension    PATIENT EDUCATION: Education details: HEP, POC, goals  Person educated: Patient Education method: Explanation, Demonstration, and Handouts Education comprehension: verbalized understanding and returned demonstration  HOME EXERCISE PROGRAM:  Access Code: [email or web address] URL: [email or web address] Date: 08/24/2023 Prepared by: Thresa Ross  Exercises - Seated Scapular Retraction  - 1 x daily - 3 sets - 10 reps - 3sec hold - Isometric Shoulder Extension at Wall  - 1 x daily - 7 x weekly - 2 sets - 10 reps - 3 sec hold - Isometric Shoulder Abduction at Wall  - 1 x daily - 7 x weekly - 2 sets - 10 reps - 3 sec hold - Isometric Shoulder Flexion at Wall  - 1 x daily - 7 x weekly - 3 sets - 10 reps - Shoulder Flexion Wall Slide with Towel  - 1 x daily - 7 x weekly - 1 sets - 10 reps - 5 sec  hold  ASSESSMENT:  CLINICAL IMPRESSION:  Patient arrived with good motivation for completion of pt activities.  STM provided to address some discomfort in the lateral deltoid following overuse over the weekend. Pt instructed to continue to minimize use of R UE for functional activities until MD allows everyday activities without sling. Continued with current plan of care as laid out in evaluation and recent prior sessions. Pt remains motivated to advance progress toward goals in order to maximize independence and safety at home. Pt requires high level assistance and cuing for completion of exercises in order to provide adequate level of stimulation challenge while  minimizing pain and risk for excess tissue strain and discomfort when possible.  Pt continues to demonstrate progress toward goals AEB progression of interventions this date either in volume or intensity.   OBJECTIVE IMPAIRMENTS: decreased endurance, decreased mobility, decreased ROM, decreased strength, hypomobility, impaired flexibility, impaired UE functional use, postural dysfunction, and pain.   ACTIVITY LIMITATIONS: carrying, lifting, sleeping, bathing, toileting, dressing, reach over head, and hygiene/grooming  PARTICIPATION LIMITATIONS: meal prep, cleaning, laundry, driving, and community activity  PERSONAL FACTORS: Age, Past/current experiences, and Time since onset of injury/illness/exacerbation are also affecting patient's functional outcome.   REHAB POTENTIAL: Good  CLINICAL DECISION MAKING: Stable/uncomplicated  EVALUATION COMPLEXITY: Moderate  GOALS: Goals reviewed with patient? Yes  SHORT TERM GOALS: Target date: 09/13/2023  Patient will be independent in HEP to improve strength/mobility for better functional independence with ADLs. Baseline: 3/12: HEP initiated  Goal status: INITIAL   LONG TERM GOALS: Target date: 10/25/2023   Patient will decrease Quick DASH score by > 15 points demonstrating reduced self-reported upper extremity disability. Baseline: 3/12: 70.5% Goal status: INITIAL  2.  Patient will improve shoulder AROM to > 120 degrees of flexion, scaption, and abduction for improved ability to perform overhead activities.  Baseline: 3/12: see chart above -currently only PROM per protocol Goal status: INITIAL  3.  Patient will improve R UE strength to >4/5 MMT to demonstrate ability to complete ADLs and other functional tasks with R UE for reaching overhead.  Baseline: 3/12: no AROM per protocol Goal status: INITIAL  4.  Patient will report a worst pain of <3/10 on NRPS in R UE to improve tolerance with ADLs and reduced symptoms with activities.  Baseline:  3/12: 6-7/10 pain Goal status: INITIAL   PLAN:  PT FREQUENCY: 1-2x/week  PT DURATION: 12 weeks  PLANNED INTERVENTIONS: 97164- PT Re-evaluation, 97110-Therapeutic exercises, 97530- Therapeutic activity, 97112- Neuromuscular re-education, 97535- Self Care, 16109- Manual therapy, G0283- Electrical stimulation (unattended), [phone]- Electrical stimulation (manual), Patient/Family education, Taping, Dry Needling, Joint mobilization, Joint manipulation, Scar mobilization, DME instructions, Cryotherapy, and Moist heat  PLAN FOR NEXT SESSION: HEP review, continue POC and gentle progression   Norman Herrlich PT ,DPT Physical Therapist- [place]  [facility]  08/31/2023, 9:58 AM

## 2023-09-05 ENCOUNTER — Ambulatory Visit: Payer: Medicare Other | Admitting: Physical Therapy

## 2023-09-05 DIAGNOSIS — M25611 Stiffness of right shoulder, not elsewhere classified: Secondary | ICD-10-CM

## 2023-09-05 DIAGNOSIS — M6281 Muscle weakness (generalized): Secondary | ICD-10-CM

## 2023-09-05 DIAGNOSIS — M25511 Pain in right shoulder: Secondary | ICD-10-CM | POA: Diagnosis not present

## 2023-09-05 NOTE — Therapy (Signed)
 OUTPATIENT PHYSICAL THERAPY TREATMENT/ Physical Therapy Progress Note   Dates of reporting period  08/02/23   to   09/05/23   Patient Name: Vincent Cole MRN: [id number] DOB:09-11-1950, 73 y.o., male Today's Date: 09/05/2023  END OF SESSION:  PT End of Session - 09/05/23 1333     Visit Number 10    Number of Visits 25    Date for PT Re-Evaluation 10/25/23    Authorization Type Medicare; BCBS fed secondary    Progress Note Due on Visit 10    PT Start Time 1315    PT Stop Time 1355    PT Time Calculation (min) 40 min    Equipment Utilized During Treatment Gait belt    Activity Tolerance Patient tolerated treatment well;No increased pain    Behavior During Therapy [facility] for tasks assessed/performed                Past Medical History:  Diagnosis Date   Abdominal pain 01/17/2011   Formatting of this note might be different from the original.  Note: Unchanged - chronic  suprapubic/lower abdominal area since TURP in 2005; urology has now diagnosed, after extensive evaluation, interstitial cystitis; has also had repeat TURP in June 2010, and a surgery on the neck of the bladder in July of 2011     Actinic keratosis    Arthritis    Benign bladder mass    Complete tear of left rotator cuff 04/19/2017   Complication of anesthesia    PONV x 1 in 2010   Depression    Frequency of urination    GERD (gastroesophageal reflux disease) no meds   Heart murmur    was told many years ago   History of DVT of lower extremity 02/03/2020   History of recurrent UTIs    Hypertension    IC (interstitial cystitis)    Nocturia    OSA on CPAP nightly   Pelvic pain in male    PONV (postoperative nausea and vomiting)    Simple renal cyst    Urgency of urination    Urinary hesitancy    Past Surgical History:  Procedure Laterality Date   BUNIONECTOMY Right 01/15/2021   Procedure: Flavia Shipper;  Surgeon: Gwyneth Revels, DPM;  Location: [facility];  Service: Podiatry;  Laterality:  Right;   COLONOSCOPY     COLONOSCOPY WITH PROPOFOL N/A 06/25/2021   Procedure: COLONOSCOPY WITH PROPOFOL;  Surgeon: Regis Bill, MD;  Location: [facility];  Service: Endoscopy;  Laterality: N/A;   CORRECTION OVERLAPPING TOES Right 01/15/2021   Procedure: CORRECTION OVERLAPPING TOES;  Surgeon: Gwyneth Revels, DPM;  Location: [facility];  Service: Podiatry;  Laterality: Right;   CYSTO WITH HYDRODISTENSION  06/02/2011   Procedure: CYSTOSCOPY/HYDRODISTENSION;  Surgeon: Martina Sinner, MD;  Location[facility];  Service: Urology;  Laterality: N/A;  catheter placement   HAMMER TOE SURGERY Right 01/15/2021   Procedure: HAMMER TOE CORRECTION;  Surgeon: Gwyneth Revels, DPM;  Location: [facility];  Service: Podiatry;  Laterality: Right;   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   JOINT REPLACEMENT     shoulder, left knee   KNEE ARTHROSCOPY     TRANSURETHRAL RESECTION OF PROSTATE  X3   LAST ONE MAY 2012   WEIL OSTEOTOMY Right 01/15/2021   Procedure: WEIL;  Surgeon: Gwyneth Revels, DPM;  Location: [facility];  Service: Podiatry;  Laterality: Right;   Patient Active Problem List   Diagnosis Date Noted   GAD (generalized anxiety disorder) 06/05/2023  Low back pain 08/04/2022   Gastroesophageal reflux disease without esophagitis 07/22/2022   Leg pain 06/19/2022   BMI 36.0-36.9,adult 11/17/2020   Bilateral lower extremity edema 02/03/2020   DDD (degenerative disc disease), lumbosacral 06/22/2018   Lumbar spondylosis 06/22/2018   Pain in limb 10/20/2017   Arthritis 08/02/2017   Insomnia 08/02/2017   OSA (obstructive sleep apnea) 08/02/2017   BNC (bladder neck contracture) 07/31/2017   Frontal lobe deficit 11/22/2016   Essential hypertension, benign 05/21/2015   Arthritis of left knee 05/21/2015   Interstitial cystitis 07/04/2011   Disorder of skin and subcutaneous tissue 04/04/2011   Hyperlipidemia 04/04/2011   Benign prostatic hyperplasia with urinary obstruction  01/17/2011   Scoliosis 01/17/2011    PCP: Meliton Rattan, MD  REFERRING PROVIDER: Altamese Cabal, PA-C   REFERRING DIAG: [phone] (ICD-10-CM) - Presence of right artificial shoulder joint  THERAPY DIAG:  Stiffness of right shoulder, not elsewhere classified  Muscle weakness (generalized)  Acute pain of right shoulder  Rationale for Evaluation and Treatment: Rehabilitation  ONSET DATE: 07/27/2023  SUBJECTIVE:                                                                                                                                                                                      SUBJECTIVE STATEMENT:  Pt reports some discomfort in his R UE after accidentally using it to lift a pot when helping his son cook. Pt localizes discomfort to the lateral proximal deltoid region.   PERTINENT HISTORY: S/p R reverse TSA on 07/27/2023 Recently being seen in this clinic for balance and LE weakness and discharge 07/21/23.   Hand dominance: Right PAIN:  Are you having pain? Yes 2-3/10 "not bad"   PRECAUTIONS: Shoulder  WEIGHT BEARING RESTRICTIONS: Yes NWB   FALLS:  Has patient fallen in last 6 months? No  LIVING ENVIRONMENT: Lives with: lives alone - daughter is currently staying with him for a couple of weeks Lives in: House/apartment Has following equipment at home: None  OCCUPATION: Retired   PLOF: Independent  PATIENT GOALS: to be able to do what he needs to do without pain   NEXT MD VISIT:   OBJECTIVE:  Note: Objective measures were completed at Evaluation unless otherwise noted.  DIAGNOSTIC FINDINGS:  Post op imaging not present in chart   PATIENT SURVEYS:  Quick Dash 70.5/100 = 70.5%  COGNITION: Overall cognitive status: Within functional limits for tasks assessed     SENSATION: WFL  POSTURE: Forward head, rounded shoulders   UPPER EXTREMITY ROM:   Passive ROM Right eval Right 08/11/23 4/15PN AROM  Shoulder flexion 105 115 113  Shoulder extension      Shoulder  abduction 75 90 122  Shoulder adduction     Shoulder internal rotation     Shoulder external rotation 35 105   Elbow flexion     Elbow extension     (Blank rows = not tested)  PALPATION:  Significant bruising noted to entire R upper arm from elbow to shoulder  General tenderness to incision which is covered by waterproof bandage                                                                                                                       TODAY'S TREATMENT: 09/05/23 MMT:  4+/5 elbow flex/ ext R  4-/5 R shoulder flex, ABD, ER, IR trembling noted   Manual Ischemic TP release to the R lateral proximal deltoid and STM to ease discomfort in this area   TE Scifit level 4 x 3 min pull then 3 min push Supine shoulder flexion x 10 x 10 sec hold with dowel for AAORM  Supine press up with a plus with 5# bar x 10 reps  Standing shoulder flexion AAROm with dowel Standing shoulder ABD AAROM with dowel.  Standing shoulder flexion with supinated grip and 5# bar   Manual Supine STM and TP release to lateral and ant deltoid on R x 4 min    PATIENT EDUCATION: Education details: HEP, POC, goals  Person educated: Patient Education method: Explanation, Demonstration, and Handouts Education comprehension: verbalized understanding and returned demonstration  HOME EXERCISE PROGRAM:  Access Code: [email or web address] URL: [email or web address] Date: 08/24/2023 Prepared by: Marlynn Singer  Exercises - Seated Scapular Retraction  - 1 x daily - 3 sets - 10 reps - 3sec hold - Isometric Shoulder Extension at Wall  - 1 x daily - 7 x weekly - 2 sets - 10 reps - 3 sec hold - Isometric Shoulder Abduction at Wall  - 1 x daily - 7 x weekly - 2 sets - 10 reps - 3 sec hold - Isometric Shoulder Flexion at Wall  - 1 x daily - 7 x weekly - 3 sets - 10 reps - Shoulder Flexion Wall Slide with Towel  - 1 x daily - 7 x weekly - 1 sets - 10 reps - 5 sec  hold  ASSESSMENT:  CLINICAL  IMPRESSION:  Pt presents to PT for progress note this date. Pt just saw MD for follow up yesterday and MD is very happy with current progress. Pt able to come out of brace now and needs to work on muscular strength and endurance. Pt shows progress towards all long term goals and is progressing well at this time. Patient's condition has the potential to improve in response to therapy. Maximum improvement is yet to be obtained. The anticipated improvement is attainable and reasonable in a generally predictable time.  Pt will continue to benefit from skilled physical therapy intervention to address impairments, improve QOL, and attain therapy goals.    OBJECTIVE IMPAIRMENTS: decreased endurance, decreased mobility, decreased ROM, decreased strength, hypomobility, impaired flexibility, impaired UE  functional use, postural dysfunction, and pain.   ACTIVITY LIMITATIONS: carrying, lifting, sleeping, bathing, toileting, dressing, reach over head, and hygiene/grooming  PARTICIPATION LIMITATIONS: meal prep, cleaning, laundry, driving, and community activity  PERSONAL FACTORS: Age, Past/current experiences, and Time since onset of injury/illness/exacerbation are also affecting patient's functional outcome.   REHAB POTENTIAL: Good  CLINICAL DECISION MAKING: Stable/uncomplicated  EVALUATION COMPLEXITY: Moderate  GOALS: Goals reviewed with patient? Yes  SHORT TERM GOALS: Target date: 09/13/2023  Patient will be independent in HEP to improve strength/mobility for better functional independence with ADLs. Baseline: 3/12: HEP initiated 4/15: pt doing HEP and updates to HEP regularly  Goal status: MET   LONG TERM GOALS: Target date: 10/25/2023   Patient will decrease Quick DASH score by > 15 points demonstrating reduced self-reported upper extremity disability. Baseline: 3/12: 70.5% 4/15:20.45% Goal status: MET  2.  Patient will improve shoulder AROM to > 120 degrees of flexion, scaption, and abduction  for improved ability to perform overhead activities. Baseline: 3/12: see chart above -currently only PROM per protocol 4/15: see chart in obj  Goal status: Ongoing  3.  Patient will improve R UE strength to >4/5 MMT to demonstrate ability to complete ADLs and other functional tasks with R UE for reaching overhead.  Baseline: 3/12: no AROM per protocol 4/15: met at elbow, 4-/5 at shoulder musculature ( ER, IR, flex, abd) Goal status: ONGOING  4.  Patient will report a worst pain of <3/10 on NRPS in R UE to improve tolerance with ADLs and reduced symptoms with activities.  Baseline: 3/12: 6-7/10 pain 4/15: 4/10 worst in past week Goal status: Met   PLAN:  PT FREQUENCY: 1-2x/week  PT DURATION: 12 weeks  PLANNED INTERVENTIONS: 97164- PT Re-evaluation, 97110-Therapeutic exercises, 97530- Therapeutic activity, 97112- Neuromuscular re-education, 97535- Self Care, 16109- Manual therapy, G0283- Electrical stimulation (unattended), [phone]- Electrical stimulation (manual), Patient/Family education, Taping, Dry Needling, Joint mobilization, Joint manipulation, Scar mobilization, DME instructions, Cryotherapy, and Moist heat  PLAN FOR NEXT SESSION: HEP review, continue POC and gentle progression of strength    Edwina Gram PT ,DPT Physical Therapist- [facility]  [facility]  09/05/2023, 2:04 PM

## 2023-09-07 ENCOUNTER — Ambulatory Visit: Payer: Medicare Other | Admitting: Physical Therapy

## 2023-09-07 DIAGNOSIS — M25511 Pain in right shoulder: Secondary | ICD-10-CM

## 2023-09-07 DIAGNOSIS — M25611 Stiffness of right shoulder, not elsewhere classified: Secondary | ICD-10-CM

## 2023-09-07 DIAGNOSIS — M6281 Muscle weakness (generalized): Secondary | ICD-10-CM

## 2023-09-07 NOTE — Therapy (Signed)
 OUTPATIENT PHYSICAL THERAPY TREATMENT/  Patient Name: Vincent Cole MRN: [id number] DOB:08/02/1950, 73 y.o., male Today's Date: 09/07/2023  END OF SESSION:  PT End of Session - 09/07/23 1316     Visit Number 11    Number of Visits 25    Date for PT Re-Evaluation 10/25/23    Authorization Type Medicare; BCBS fed secondary    Progress Note Due on Visit 20    PT Start Time 1316    PT Stop Time 1355    PT Time Calculation (min) 39 min    Equipment Utilized During Treatment Gait belt    Activity Tolerance Patient tolerated treatment well;No increased pain    Behavior During Therapy [facility] for tasks assessed/performed                Past Medical History:  Diagnosis Date   Abdominal pain 01/17/2011   Formatting of this note might be different from the original.  Note: Unchanged - chronic  suprapubic/lower abdominal area since TURP in 2005; urology has now diagnosed, after extensive evaluation, interstitial cystitis; has also had repeat TURP in June 2010, and a surgery on the neck of the bladder in July of 2011     Actinic keratosis    Arthritis    Benign bladder mass    Complete tear of left rotator cuff 04/19/2017   Complication of anesthesia    PONV x 1 in 2010   Depression    Frequency of urination    GERD (gastroesophageal reflux disease) no meds   Heart murmur    was told many years ago   History of DVT of lower extremity 02/03/2020   History of recurrent UTIs    Hypertension    IC (interstitial cystitis)    Nocturia    OSA on CPAP nightly   Pelvic pain in male    PONV (postoperative nausea and vomiting)    Simple renal cyst    Urgency of urination    Urinary hesitancy    Past Surgical History:  Procedure Laterality Date   BUNIONECTOMY Right 01/15/2021   Procedure: Berta Brittle;  Surgeon: Anell Baptist, DPM;  Location: [facility];  Service: Podiatry;  Laterality: Right;   COLONOSCOPY     COLONOSCOPY WITH PROPOFOL N/A 06/25/2021   Procedure:  COLONOSCOPY WITH PROPOFOL;  Surgeon: Shane Darling, MD;  Location: [facility];  Service: Endoscopy;  Laterality: N/A;   CORRECTION OVERLAPPING TOES Right 01/15/2021   Procedure: CORRECTION OVERLAPPING TOES;  Surgeon: Anell Baptist, DPM;  Location: [facility];  Service: Podiatry;  Laterality: Right;   CYSTO WITH HYDRODISTENSION  06/02/2011   Procedure: CYSTOSCOPY/HYDRODISTENSION;  Surgeon: Devorah Fonder, MD;  Location: [facility];  Service: Urology;  Laterality: N/A;  catheter placement   HAMMER TOE SURGERY Right 01/15/2021   Procedure: HAMMER TOE CORRECTION;  Surgeon: Anell Baptist, DPM;  Location: [facility];  Service: Podiatry;  Laterality: Right;   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   JOINT REPLACEMENT     shoulder, left knee   KNEE ARTHROSCOPY     TRANSURETHRAL RESECTION OF PROSTATE  X3   LAST ONE MAY 2012   WEIL OSTEOTOMY Right 01/15/2021   Procedure: WEIL;  Surgeon: Anell Baptist, DPM;  Location: [facility];  Service: Podiatry;  Laterality: Right;   Patient Active Problem List   Diagnosis Date Noted   GAD (generalized anxiety disorder) 06/05/2023   Low back pain 08/04/2022   Gastroesophageal reflux disease without esophagitis 07/22/2022   Leg pain 06/19/2022  BMI 36.0-36.9,adult 11/17/2020   Bilateral lower extremity edema 02/03/2020   DDD (degenerative disc disease), lumbosacral 06/22/2018   Lumbar spondylosis 06/22/2018   Pain in limb 10/20/2017   Arthritis 08/02/2017   Insomnia 08/02/2017   OSA (obstructive sleep apnea) 08/02/2017   BNC (bladder neck contracture) 07/31/2017   Frontal lobe deficit 11/22/2016   Essential hypertension, benign 05/21/2015   Arthritis of left knee 05/21/2015   Interstitial cystitis 07/04/2011   Disorder of skin and subcutaneous tissue 04/04/2011   Hyperlipidemia 04/04/2011   Benign prostatic hyperplasia with urinary obstruction 01/17/2011   Scoliosis 01/17/2011    PCP: Meliton Rattan, MD  REFERRING  PROVIDER: Altamese Cabal, PA-C   REFERRING DIAG: [phone] (ICD-10-CM) - Presence of right artificial shoulder joint  THERAPY DIAG:  No diagnosis found.  Rationale for Evaluation and Treatment: Rehabilitation  ONSET DATE: 07/27/2023  SUBJECTIVE:                                                                                                                                                                                      SUBJECTIVE STATEMENT:  Pt reports some discomfort in his R UE when abducting his arm, localizes pain to deltoid insertion on deltoid tuberosity.  PERTINENT HISTORY: S/p R reverse TSA on 07/27/2023 Recently being seen in this clinic for balance and LE weakness and discharge 07/21/23.   Hand dominance: Right PAIN:  Are you having pain? Yes 2-3/10 "not bad"   PRECAUTIONS: Shoulder  WEIGHT BEARING RESTRICTIONS: Yes NWB   FALLS:  Has patient fallen in last 6 months? No  LIVING ENVIRONMENT: Lives with: lives alone - daughter is currently staying with him for a couple of weeks Lives in: House/apartment Has following equipment at home: None  OCCUPATION: Retired   PLOF: Independent  PATIENT GOALS: to be able to do what he needs to do without pain   NEXT MD VISIT:   OBJECTIVE:  Note: Objective measures were completed at Evaluation unless otherwise noted.  DIAGNOSTIC FINDINGS:  Post op imaging not present in chart   PATIENT SURVEYS:  Quick Dash 70.5/100 = 70.5%  COGNITION: Overall cognitive status: Within functional limits for tasks assessed     SENSATION: WFL  POSTURE: Forward head, rounded shoulders   UPPER EXTREMITY ROM:   Passive ROM Right eval Right 08/11/23 4/15PN AROM  Shoulder flexion 105 115 113  Shoulder extension     Shoulder abduction 75 90 122  Shoulder adduction     Shoulder internal rotation     Shoulder external rotation 35 105   Elbow flexion     Elbow extension     (Blank rows = not tested)  PALPATION:  Significant  bruising noted to entire R upper arm from elbow to shoulder  General tenderness to incision which is covered by waterproof bandage                                                                                                                       TODAY'S TREATMENT: 09/07/23  Manual Ischemic TP release to the R lateral proximal deltoid and STM to ease discomfort in this area   TE Scifit level 4 x 3 min pull then 3 min push Supine shoulder flexion x 10 x 10 sec hold with dowel for AAORM  Supine press up with a plus with 5# bar 2x 10 reps  Standing shoulder ABD AAROM with dowel. X 10  Standing shoulder flexion with supinated grip and 5# bar 2 x 10  Standing shoulder ABD on UE ranger 2 x 10 reps   Pt educated throughout session about proper posture and technique with exercises. Improved exercise technique, movement at target joints, use of target muscles after min to mod verbal, visual, tactile cues.   Manual Supine STM, IASTM, and TP release to lateral and ant deltoid on R  x 10 min  -notable TP on R near deltoid tuberosity on lateral deltoid   PATIENT EDUCATION: Education details: Pt educated throughout session about proper posture and technique with exercises. Improved exercise technique, movement at target joints, use of target muscles after min to mod verbal, visual, tactile cues. Person educated: Patient Education method: Explanation, Demonstration, and Handouts Education comprehension: verbalized understanding and returned demonstration  HOME EXERCISE PROGRAM:  Access Code: [email or web address] URL: [email or web address] Date: 08/24/2023 Prepared by: Marlynn Singer  Exercises - Seated Scapular Retraction  - 1 x daily - 3 sets - 10 reps - 3sec hold - Isometric Shoulder Extension at Wall  - 1 x daily - 7 x weekly - 2 sets - 10 reps - 3 sec hold - Isometric Shoulder Abduction at Wall  - 1 x daily - 7 x weekly - 2 sets - 10 reps - 3 sec hold - Isometric Shoulder Flexion  at Wall  - 1 x daily - 7 x weekly - 3 sets - 10 reps - Shoulder Flexion Wall Slide with Towel  - 1 x daily - 7 x weekly - 1 sets - 10 reps - 5 sec  hold  ASSESSMENT:  CLINICAL IMPRESSION: Continued with current plan of care as laid out in evaluation and recent prior sessions. Pt remains motivated to advance progress toward goals in order to maximize independence and safety at home. Pt requires high level assistance and cuing for completion of exercises in order to provide adequate level of stimulation challenge while minimizing pain and discomfort when possible. Pt closely monitored throughout session pt response and to maximize patient safety during interventions. Pt continues to demonstrate progress toward goals AEB progression of interventions this date either in volume or intensity.    OBJECTIVE IMPAIRMENTS: decreased endurance, decreased mobility, decreased ROM, decreased strength, hypomobility, impaired flexibility, impaired  UE functional use, postural dysfunction, and pain.   ACTIVITY LIMITATIONS: carrying, lifting, sleeping, bathing, toileting, dressing, reach over head, and hygiene/grooming  PARTICIPATION LIMITATIONS: meal prep, cleaning, laundry, driving, and community activity  PERSONAL FACTORS: Age, Past/current experiences, and Time since onset of injury/illness/exacerbation are also affecting patient's functional outcome.   REHAB POTENTIAL: Good  CLINICAL DECISION MAKING: Stable/uncomplicated  EVALUATION COMPLEXITY: Moderate  GOALS: Goals reviewed with patient? Yes  SHORT TERM GOALS: Target date: 09/13/2023  Patient will be independent in HEP to improve strength/mobility for better functional independence with ADLs. Baseline: 3/12: HEP initiated 4/15: pt doing HEP and updates to HEP regularly  Goal status: MET   LONG TERM GOALS: Target date: 10/25/2023   Patient will decrease Quick DASH score by > 15 points demonstrating reduced self-reported upper extremity  disability. Baseline: 3/12: 70.5% 4/15:20.45% Goal status: MET  2.  Patient will improve shoulder AROM to > 120 degrees of flexion, scaption, and abduction for improved ability to perform overhead activities. Baseline: 3/12: see chart above -currently only PROM per protocol 4/15: see chart in obj  Goal status: Ongoing  3.  Patient will improve R UE strength to >4/5 MMT to demonstrate ability to complete ADLs and other functional tasks with R UE for reaching overhead.  Baseline: 3/12: no AROM per protocol 4/15: met at elbow, 4-/5 at shoulder musculature ( ER, IR, flex, abd) Goal status: ONGOING  4.  Patient will report a worst pain of <3/10 on NRPS in R UE to improve tolerance with ADLs and reduced symptoms with activities.  Baseline: 3/12: 6-7/10 pain 4/15: 4/10 worst in past week Goal status: Met   PLAN:  PT FREQUENCY: 1-2x/week  PT DURATION: 12 weeks  PLANNED INTERVENTIONS: 97164- PT Re-evaluation, 97110-Therapeutic exercises, 97530- Therapeutic activity, 97112- Neuromuscular re-education, 97535- Self Care, 16109- Manual therapy, G0283- Electrical stimulation (unattended), [phone]- Electrical stimulation (manual), Patient/Family education, Taping, Dry Needling, Joint mobilization, Joint manipulation, Scar mobilization, DME instructions, Cryotherapy, and Moist heat  PLAN FOR NEXT SESSION: HEP review, continue POC and gentle progression of strength    Edwina Gram PT ,DPT Physical Therapist- [place]  [facility]  09/07/2023, 1:19 PM

## 2023-09-12 ENCOUNTER — Ambulatory Visit: Payer: Medicare Other | Admitting: Physical Therapy

## 2023-09-12 DIAGNOSIS — M25511 Pain in right shoulder: Secondary | ICD-10-CM

## 2023-09-12 DIAGNOSIS — M6281 Muscle weakness (generalized): Secondary | ICD-10-CM

## 2023-09-12 DIAGNOSIS — M25611 Stiffness of right shoulder, not elsewhere classified: Secondary | ICD-10-CM

## 2023-09-12 NOTE — Therapy (Signed)
 OUTPATIENT PHYSICAL THERAPY TREATMENT  Patient Name: Vincent Cole MRN: [id number] DOB:[birth year], 73 y.o., male Today's Date: 09/12/2023  END OF SESSION:  PT End of Session - 09/12/23 1322     Visit Number 12    Number of Visits 25    Date for PT Re-Evaluation 10/25/23    Authorization Type Medicare; BCBS fed secondary    Progress Note Due on Visit 20    PT Start Time 1315    PT Stop Time 1355    PT Time Calculation (min) 40 min    Equipment Utilized During Treatment Gait belt    Activity Tolerance Patient tolerated treatment well;No increased pain    Behavior During Therapy [facility] for tasks assessed/performed                 Past Medical History:  Diagnosis Date   Abdominal pain 01/17/2011   Formatting of this note might be different from the original.  Note: Unchanged - chronic  suprapubic/lower abdominal area since TURP in 2005; urology has now diagnosed, after extensive evaluation, interstitial cystitis; has also had repeat TURP in June 2010, and a surgery on the neck of the bladder in July of 2011     Actinic keratosis    Arthritis    Benign bladder mass    Complete tear of left rotator cuff 04/19/2017   Complication of anesthesia    PONV x 1 in 2010   Depression    Frequency of urination    GERD (gastroesophageal reflux disease) no meds   Heart murmur    was told many years ago   History of DVT of lower extremity 02/03/2020   History of recurrent UTIs    Hypertension    IC (interstitial cystitis)    Nocturia    OSA on CPAP nightly   Pelvic pain in male    PONV (postoperative nausea and vomiting)    Simple renal cyst    Urgency of urination    Urinary hesitancy    Past Surgical History:  Procedure Laterality Date   BUNIONECTOMY Right 01/15/2021   Procedure: Berta Brittle;  Surgeon: Anell Baptist, DPM;  Location: [facility];  Service: Podiatry;  Laterality: Right;   COLONOSCOPY     COLONOSCOPY WITH PROPOFOL  N/A 06/25/2021   Procedure:  COLONOSCOPY WITH PROPOFOL ;  Surgeon: Shane Darling, MD;  Location: [facility];  Service: Endoscopy;  Laterality: N/A;   CORRECTION OVERLAPPING TOES Right 01/15/2021   Procedure: CORRECTION OVERLAPPING TOES;  Surgeon: Anell Baptist, DPM;  Location: [facility];  Service: Podiatry;  Laterality: Right;   CYSTO WITH HYDRODISTENSION  06/02/2011   Procedure: CYSTOSCOPY/HYDRODISTENSION;  Surgeon: Devorah Fonder, MD;  Location: [facility];  Service: Urology;  Laterality: N/A;  catheter placement   HAMMER TOE SURGERY Right 01/15/2021   Procedure: HAMMER TOE CORRECTION;  Surgeon: Anell Baptist, DPM;  Location: [facility];  Service: Podiatry;  Laterality: Right;   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   JOINT REPLACEMENT     shoulder, left knee   KNEE ARTHROSCOPY     TRANSURETHRAL RESECTION OF PROSTATE  X3   LAST ONE MAY 2012   WEIL OSTEOTOMY Right 01/15/2021   Procedure: WEIL;  Surgeon: Anell Baptist, DPM;  Location: [facility];  Service: Podiatry;  Laterality: Right;   Patient Active Problem List   Diagnosis Date Noted   GAD (generalized anxiety disorder) 06/05/2023   Low back pain 08/04/2022   Gastroesophageal reflux disease without esophagitis 07/22/2022   Leg pain  06/19/2022   BMI 36.0-36.9,adult 11/17/2020   Bilateral lower extremity edema 02/03/2020   DDD (degenerative disc disease), lumbosacral 06/22/2018   Lumbar spondylosis 06/22/2018   Pain in limb 10/20/2017   Arthritis 08/02/2017   Insomnia 08/02/2017   OSA (obstructive sleep apnea) 08/02/2017   BNC (bladder neck contracture) 07/31/2017   Frontal lobe deficit 11/22/2016   Essential hypertension, benign 05/21/2015   Arthritis of left knee 05/21/2015   Interstitial cystitis 07/04/2011   Disorder of skin and subcutaneous tissue 04/04/2011   Hyperlipidemia 04/04/2011   Benign prostatic hyperplasia with urinary obstruction 01/17/2011   Scoliosis 01/17/2011    PCP: Marcene Serve, MD  REFERRING  PROVIDER: Maud Sorenson, PA-C   REFERRING DIAG: [phone] (ICD-10-CM) - Presence of right artificial shoulder joint  THERAPY DIAG:  Stiffness of right shoulder, not elsewhere classified  Muscle weakness (generalized)  Acute pain of right shoulder  Rationale for Evaluation and Treatment: Rehabilitation  ONSET DATE: 07/27/2023  SUBJECTIVE:                                                                                                                                                                                      SUBJECTIVE STATEMENT:  Pt reports some discomfort in his R UE when abducting his arm, localizes pain to deltoid insertion on deltoid tuberosity, says it felt better after last session but is consistently bothering him with use.   PERTINENT HISTORY: S/p R reverse TSA on 07/27/2023 Recently being seen in this clinic for balance and LE weakness and discharge 07/21/23.   Hand dominance: Right PAIN:  Are you having pain? Yes 3/10 at lateral deltoid    PRECAUTIONS: Shoulder  WEIGHT BEARING RESTRICTIONS: Yes NWB   FALLS:  Has patient fallen in last 6 months? No  LIVING ENVIRONMENT: Lives with: lives alone - daughter is currently staying with him for a couple of weeks Lives in: House/apartment Has following equipment at home: None  OCCUPATION: Retired   PLOF: Independent  PATIENT GOALS: to be able to do what he needs to do without pain   NEXT MD VISIT:   OBJECTIVE:  Note: Objective measures were completed at Evaluation unless otherwise noted.  DIAGNOSTIC FINDINGS:  Post op imaging not present in chart   PATIENT SURVEYS:  Quick Dash 70.5/100 = 70.5%  COGNITION: Overall cognitive status: Within functional limits for tasks assessed     SENSATION: WFL  POSTURE: Forward head, rounded shoulders   UPPER EXTREMITY ROM:   Passive ROM Right eval Right 08/11/23 4/15PN AROM  Shoulder flexion 105 115 113  Shoulder extension     Shoulder abduction 75 90 122   Shoulder adduction  Shoulder internal rotation     Shoulder external rotation 35 105   Elbow flexion     Elbow extension     (Blank rows = not tested)  PALPATION:  Significant bruising noted to entire R upper arm from elbow to shoulder  General tenderness to incision which is covered by waterproof bandage                                                                                                                       TODAY'S TREATMENT: 09/12/23  Manual  Ischemic TP release to the R lateral proximal deltoid and IASTM using free up massage cream to ease discomfort in this area  notable TP on R near deltoid tuberosity on lateral deltoid  TE  Scifit level 4 x 3 min pull then 3 min push Supine shoulder flexion x 10 x 10 sec hold with dowel for AAORM, cues for hand position to target ant shoulder muscle rather than lateral  Supine press up with dowell 3 x 10 - had pain with plus so did higher reps of normal press up, no weight and no pain increase  Supine shoulder ABD PROM by PT x 5 min   Pt limited by pain this date, instructed pt to limit arm abduction over next few days and educated pt regarding some options for pain relief.   Pt educated throughout session about proper posture and technique with exercises. Improved exercise technique, movement at target joints, use of target muscles after min to mod verbal, visual, tactile cues.     PATIENT EDUCATION: Education details: Pt educated throughout session about proper posture and technique with exercises. Improved exercise technique, movement at target joints, use of target muscles after min to mod verbal, visual, tactile cues. Person educated: Patient Education method: Explanation, Demonstration, and Handouts Education comprehension: verbalized understanding and returned demonstration  HOME EXERCISE PROGRAM:  Access Code: [email or web address] URL: [email or web address] Date: 08/24/2023 Prepared by: Marlynn Singer  Exercises - Seated Scapular Retraction  - 1 x daily - 3 sets - 10 reps - 3sec hold - Isometric Shoulder Extension at Wall  - 1 x daily - 7 x weekly - 2 sets - 10 reps - 3 sec hold - Isometric Shoulder Abduction at Wall  - 1 x daily - 7 x weekly - 2 sets - 10 reps - 3 sec hold - Isometric Shoulder Flexion at Wall  - 1 x daily - 7 x weekly - 3 sets - 10 reps - Shoulder Flexion Wall Slide with Towel  - 1 x daily - 7 x weekly - 1 sets - 10 reps - 5 sec  hold  ASSESSMENT:  CLINICAL IMPRESSION:  Pt presents with continued increaed pain in lateral deltoid region since coming out of sling. PT instructed pt to avoid active abduction motion to assess if this improves his pain and soreness. Pt regressed with some exercises to ensure no pain exacerbation this date. Pt will continue to benefit from skilled physical therapy intervention to  address impairments, improve QOL, and attain therapy goals. Will hopefully return to more functional strengthening and ROM next date.    OBJECTIVE IMPAIRMENTS: decreased endurance, decreased mobility, decreased ROM, decreased strength, hypomobility, impaired flexibility, impaired UE functional use, postural dysfunction, and pain.   ACTIVITY LIMITATIONS: carrying, lifting, sleeping, bathing, toileting, dressing, reach over head, and hygiene/grooming  PARTICIPATION LIMITATIONS: meal prep, cleaning, laundry, driving, and community activity  PERSONAL FACTORS: Age, Past/current experiences, and Time since onset of injury/illness/exacerbation are also affecting patient's functional outcome.   REHAB POTENTIAL: Good  CLINICAL DECISION MAKING: Stable/uncomplicated  EVALUATION COMPLEXITY: Moderate  GOALS: Goals reviewed with patient? Yes  SHORT TERM GOALS: Target date: 09/13/2023  Patient will be independent in HEP to improve strength/mobility for better functional independence with ADLs. Baseline: 3/12: HEP initiated 4/15: pt doing HEP and updates to HEP  regularly  Goal status: MET   LONG TERM GOALS: Target date: 10/25/2023   Patient will decrease Quick DASH score by > 15 points demonstrating reduced self-reported upper extremity disability. Baseline: 3/12: 70.5% 4/15:20.45% Goal status: MET  2.  Patient will improve shoulder AROM to > 120 degrees of flexion, scaption, and abduction for improved ability to perform overhead activities. Baseline: 3/12: see chart above -currently only PROM per protocol 4/15: see chart in obj  Goal status: Ongoing  3.  Patient will improve R UE strength to >4/5 MMT to demonstrate ability to complete ADLs and other functional tasks with R UE for reaching overhead.  Baseline: 3/12: no AROM per protocol 4/15: met at elbow, 4-/5 at shoulder musculature ( ER, IR, flex, abd) Goal status: ONGOING  4.  Patient will report a worst pain of <3/10 on NRPS in R UE to improve tolerance with ADLs and reduced symptoms with activities.  Baseline: 3/12: 6-7/10 pain 4/15: 4/10 worst in past week Goal status: Met   PLAN:  PT FREQUENCY: 1-2x/week  PT DURATION: 12 weeks  PLANNED INTERVENTIONS: 97164- PT Re-evaluation, 97110-Therapeutic exercises, 97530- Therapeutic activity, 97112- Neuromuscular re-education, 97535- Self Care, 78469- Manual therapy, G0283- Electrical stimulation (unattended), [phone]- Electrical stimulation (manual), Patient/Family education, Taping, Dry Needling, Joint mobilization, Joint manipulation, Scar mobilization, DME instructions, Cryotherapy, and Moist heat  PLAN FOR NEXT SESSION: HEP review, continue POC and gentle progression of strength    Edwina Gram PT ,DPT Physical Therapist- [place]  [facility]  09/12/2023, 1:24 PM

## 2023-09-13 ENCOUNTER — Encounter: Payer: Self-pay | Admitting: Dermatology

## 2023-09-13 ENCOUNTER — Other Ambulatory Visit: Payer: Self-pay

## 2023-09-13 ENCOUNTER — Ambulatory Visit: Admitting: Dermatology

## 2023-09-13 ENCOUNTER — Other Ambulatory Visit: Payer: Self-pay | Admitting: Internal Medicine

## 2023-09-13 DIAGNOSIS — L720 Epidermal cyst: Secondary | ICD-10-CM | POA: Diagnosis not present

## 2023-09-13 DIAGNOSIS — I10 Essential (primary) hypertension: Secondary | ICD-10-CM

## 2023-09-13 MED ORDER — MUPIROCIN 2 % EX OINT: 1.0000 | TOPICAL_OINTMENT | Freq: Every day | CUTANEOUS | 0 refills | Status: DC

## 2023-09-13 NOTE — Progress Notes (Signed)
 Resent medication since patient states it was not received by pharmacy.

## 2023-09-13 NOTE — Progress Notes (Signed)
   Follow-Up Visit   Subjective  Vincent Cole is a 73 y.o. male who presents for the following: Excision of cyst at central upper back  The following portions of the chart were reviewed this encounter and updated as appropriate: medications, allergies, medical history  Review of Systems:  No other skin or systemic complaints except as noted in HPI or Assessment and Plan.  Objective  Well appearing patient in no apparent distress; mood and affect are within normal limits.  A focused examination was performed of the following areas: back Relevant physical exam findings are noted in the Assessment and Plan.   Central Upper Back Subcutaneous nodule 1.7 cm with visible ostium and overlying scar  Assessment & Plan   EPIDERMAL INCLUSION CYST Central Upper Back Skin excision - Central Upper Back  Total excision diameter (cm):  1.7 Informed consent: discussed and consent obtained   Timeout: patient name, date of birth, surgical site, and procedure verified   Procedure prep:  Patient was prepped and draped in usual sterile fashion Prep type:  Chlorhexidine  Anesthesia: the lesion was anesthetized in a standard fashion   Anesthetic:  1% lidocaine  w/ epinephrine 1-100,000 buffered w/ 8.4% NaHCO3 (12 cc) Instrument used: #15 blade   Hemostasis achieved with: pressure   Outcome: patient tolerated procedure well with no complications    Skin repair - Central Upper Back Complexity:  Intermediate Final length (cm):  4.5 Informed consent: discussed and consent obtained   Timeout: patient name, date of birth, surgical site, and procedure verified   Procedure prep:  Patient was prepped and draped in usual sterile fashion Prep type:  Chlorhexidine  Anesthesia: the lesion was anesthetized in a standard fashion   Anesthetic:  1% lidocaine  w/ epinephrine 1-100,000 buffered w/ 8.4% NaHCO3 Reason for type of repair: reduce tension to allow closure, reduce the risk of dehiscence, infection, and  necrosis, reduce subcutaneous dead space and avoid a hematoma, allow closure of the large defect and preserve normal anatomy   Undermining: edges could be approximated without difficulty   Subcutaneous layers (deep stitches):  Suture size:  3-0 Suture type: Monocryl (poliglecaprone 25)   Stitches:  Buried vertical mattress Fine/surface layer approximation (top stitches):  Suture size:  4-0 Suture type: Prolene (polypropylene)   Stitches: simple running   Suture removal (days):  14 Hemostasis achieved with: suture, pressure and electrodesiccation Outcome: patient tolerated procedure well with no complications   Post-procedure details: sterile dressing applied and wound care instructions given   Dressing type: petrolatum, bandage and pressure dressing   Specimen 1 - Surgical pathology Differential Diagnosis: Cyst vs Other  Check Margins: No   Return in about 2 weeks (around 09/27/2023) for Suture Removal.  Kerstin Peeling, RMA, am acting as scribe for Harris Liming, MD .   Documentation: I have reviewed the above documentation for accuracy and completeness, and I agree with the above.  Harris Liming, MD

## 2023-09-13 NOTE — Patient Instructions (Signed)

## 2023-09-14 ENCOUNTER — Ambulatory Visit: Payer: Medicare Other | Admitting: Physical Therapy

## 2023-09-17 NOTE — Assessment & Plan Note (Signed)
 Sending refills for his duloxetine . Patient given samples of rexulti to try.  Will follow up in 2 weeks to see if this helps.

## 2023-09-18 LAB — SURGICAL PATHOLOGY

## 2023-09-19 ENCOUNTER — Ambulatory Visit: Payer: Medicare Other | Admitting: Physical Therapy

## 2023-09-19 ENCOUNTER — Telehealth: Payer: Self-pay

## 2023-09-19 NOTE — Telephone Encounter (Signed)
 LMOVM for patient to C/B for pathology results

## 2023-09-19 NOTE — Telephone Encounter (Signed)
-----   Message from [place] sent at 09/18/2023  9:18 PM EDT ----- Diagnosis central upper back :       EXCISION, EPIDERMOID CYST, MARGINS FREE    Please call to share that excision fully removed the benign cyst and get update on surgical wound. Thank you.

## 2023-09-21 ENCOUNTER — Ambulatory Visit: Payer: Medicare Other | Admitting: Physical Therapy

## 2023-09-21 NOTE — Telephone Encounter (Signed)
 Advised pt of bx results.  Patient said excision site healing with no issues.  Advised pt to keep f/u appointment for suture removal./sh

## 2023-09-26 ENCOUNTER — Ambulatory Visit: Payer: Medicare Other | Admitting: Physical Therapy

## 2023-09-27 ENCOUNTER — Ambulatory Visit: Admitting: Family

## 2023-09-28 ENCOUNTER — Ambulatory Visit: Payer: Medicare Other | Admitting: Physical Therapy

## 2023-09-28 ENCOUNTER — Encounter: Payer: Self-pay | Admitting: Dermatology

## 2023-09-28 ENCOUNTER — Ambulatory Visit: Admitting: Dermatology

## 2023-09-28 DIAGNOSIS — Z4802 Encounter for removal of sutures: Secondary | ICD-10-CM

## 2023-09-28 DIAGNOSIS — Z5189 Encounter for other specified aftercare: Secondary | ICD-10-CM

## 2023-09-28 NOTE — Patient Instructions (Signed)

## 2023-09-28 NOTE — Progress Notes (Signed)
   Follow-Up Visit   Subjective  Vincent Cole is a 73 y.o. male who presents for the following: Suture removal  Pathology showed epidermoid cyst  The following portions of the chart were reviewed this encounter and updated as appropriate: medications, allergies, medical history  Review of Systems:  No other skin or systemic complaints except as noted in HPI or Assessment and Plan.  Objective  Well appearing patient in no apparent distress; mood and affect are within normal limits.  Areas Examined: Back  Relevant physical exam findings are noted in the Assessment and Plan.    Assessment & Plan   VISIT FOR WOUND CHECK   ENCOUNTER FOR REMOVAL OF SUTURES   Encounter for Removal of Sutures - Incision site is clean, dry and intact. - Wound cleansed, sutures removed, wound cleansed  - Discussed pathology results showing epidermoid cyst - Scars remodel for a full year. - patient can apply over-the-counter silicone scar cream once to twice a day to help with scar remodeling if desired. - Patient advised to call with any concerns or if they notice any new or changing lesions.  Can remove other EICs if desired but not required. They are benign Return for Follow Up As Scheduled, With Dr. Bary Likes.  I, Jill Parcell, CMA, am acting as scribe for Harris Liming, MD.   Documentation: I have reviewed the above documentation for accuracy and completeness, and I agree with the above.  Harris Liming, MD

## 2023-09-29 ENCOUNTER — Ambulatory Visit: Admitting: Family

## 2023-09-29 ENCOUNTER — Encounter: Payer: Self-pay | Admitting: Family

## 2023-09-29 VITALS — BP 120/80 | HR 94 | Ht 64.5 in | Wt 202.6 lb

## 2023-09-29 DIAGNOSIS — Z0130 Encounter for examination of blood pressure without abnormal findings: Secondary | ICD-10-CM

## 2023-09-29 DIAGNOSIS — R1031 Right lower quadrant pain: Secondary | ICD-10-CM

## 2023-09-29 DIAGNOSIS — R3911 Hesitancy of micturition: Secondary | ICD-10-CM

## 2023-09-29 DIAGNOSIS — G4733 Obstructive sleep apnea (adult) (pediatric): Secondary | ICD-10-CM

## 2023-09-29 DIAGNOSIS — R7989 Other specified abnormal findings of blood chemistry: Secondary | ICD-10-CM | POA: Diagnosis not present

## 2023-09-29 DIAGNOSIS — M1990 Unspecified osteoarthritis, unspecified site: Secondary | ICD-10-CM | POA: Diagnosis not present

## 2023-10-03 ENCOUNTER — Other Ambulatory Visit: Payer: Self-pay | Admitting: Orthopedic Surgery

## 2023-10-03 ENCOUNTER — Ambulatory Visit: Payer: Medicare Other | Admitting: Physical Therapy

## 2023-10-03 DIAGNOSIS — Z96611 Presence of right artificial shoulder joint: Secondary | ICD-10-CM

## 2023-10-05 ENCOUNTER — Ambulatory Visit: Payer: Medicare Other | Admitting: Physical Therapy

## 2023-10-10 ENCOUNTER — Ambulatory Visit
Admission: RE | Admit: 2023-10-10 | Discharge: 2023-10-10 | Disposition: A | Discharge status: Home / Self Care | Source: Ambulatory Visit | Attending: Family | Admitting: Family

## 2023-10-10 ENCOUNTER — Ambulatory Visit: Payer: Medicare Other | Admitting: Physical Therapy

## 2023-10-10 DIAGNOSIS — R1031 Right lower quadrant pain: Secondary | ICD-10-CM

## 2023-10-11 LAB — ANA 12 PROFILE, DO ALL RDL
Anti-Cardiolipin Ab, IgA (RDL): <12 U/mL (ref <12)
Anti-Cardiolipin Ab, IgG (RDL): <15 GPL U/mL (ref <15)
Anti-Cardiolipin Ab, IgM (RDL): <13 [MPL'U]/mL (ref <13)
Anti-Centromere Ab (RDL): <1:40 {titer}
Anti-La (SS-B) Ab (RDL): <20 U (ref <20)
Anti-Nuclear Ab by IFA (RDL): POSITIVE — AB
Anti-Ro (SS-A) Ab (RDL): <20 U (ref <20)
Anti-Scl-70 Ab (RDL): <20 U (ref <20)
Anti-Sm Ab (RDL): <20 U (ref <20)
Anti-TPO Ab (RDL): <9 [IU]/mL (ref <9.0)
Anti-U1 RNP Ab (RDL): 25 U — ABNORMAL HIGH (ref <20)
Anti-dsDNA Ab by Farr(RDL): <8 [IU]/mL (ref <8.0)
C3 Complement (RDL): 141 mg/dL (ref 90–180)
C4 Complement (RDL): 25 mg/dL (ref 10–40)

## 2023-10-11 LAB — CMP14+EGFR
ALT: 21 IU/L (ref 0–44)
AST: 23 IU/L (ref 0–40)
Albumin: 4.1 g/dL (ref 3.8–4.8)
Alkaline Phosphatase: 129 IU/L — ABNORMAL HIGH (ref 44–121)
BUN/Creatinine Ratio: 16 (ref 10–24)
BUN: 15 mg/dL (ref 8–27)
Bilirubin Total: <0.2 mg/dL (ref 0.0–1.2)
CO2: 21 mmol/L (ref 20–29)
Calcium: 9.2 mg/dL (ref 8.6–10.2)
Chloride: 99 mmol/L (ref 96–106)
Creatinine, Ser: 0.92 mg/dL (ref 0.76–1.27)
Globulin, Total: 2.8 g/dL (ref 1.5–4.5)
Glucose: 87 mg/dL (ref 70–99)
Potassium: 4.8 mmol/L (ref 3.5–5.2)
Sodium: 134 mmol/L (ref 134–144)
Total Protein: 6.9 g/dL (ref 6.0–8.5)
eGFR: 87 mL/min/{1.73_m2} (ref >59)

## 2023-10-11 LAB — CBC WITH DIFFERENTIAL/PLATELET
Basophils Absolute: 0 10*3/uL (ref 0.0–0.2)
Basos: 0 %
EOS (ABSOLUTE): 0.2 10*3/uL (ref 0.0–0.4)
Eos: 3 %
Hematocrit: 40.1 % (ref 37.5–51.0)
Hemoglobin: 13 g/dL (ref 13.0–17.7)
Immature Grans (Abs): 0 10*3/uL (ref 0.0–0.1)
Immature Granulocytes: 0 %
Lymphocytes Absolute: 1.5 10*3/uL (ref 0.7–3.1)
Lymphs: 20 %
MCH: 29.2 pg (ref 26.6–33.0)
MCHC: 32.4 g/dL (ref 31.5–35.7)
MCV: 90 fL (ref 79–97)
Monocytes Absolute: 0.8 10*3/uL (ref 0.1–0.9)
Monocytes: 10 %
Neutrophils Absolute: 4.9 10*3/uL (ref 1.4–7.0)
Neutrophils: 67 %
Platelets: 428 10*3/uL (ref 150–450)
RBC: 4.45 x10E6/uL (ref 4.14–5.80)
RDW: 13.1 % (ref 11.6–15.4)
WBC: 7.4 10*3/uL (ref 3.4–10.8)

## 2023-10-11 LAB — ANA TITER AND PATTERN
Midbody Pattern: 1:40 {titer} — ABNORMAL HIGH
Speckled Pattern: 1:40 {titer} — ABNORMAL HIGH

## 2023-10-11 LAB — PSA: Prostate Specific Ag, Serum: 0.9 ng/mL (ref 0.0–4.0)

## 2023-10-11 LAB — RHEUMATOID ARTHRITIS PROFILE
Cyclic Citrullin Peptide Ab: 10 U (ref 0–19)
Rheumatoid fact SerPl-aCnc: 10.7 [IU]/mL (ref <14.0)

## 2023-10-12 ENCOUNTER — Ambulatory Visit: Payer: Medicare Other | Admitting: Physical Therapy

## 2023-10-12 ENCOUNTER — Ambulatory Visit
Admission: RE | Admit: 2023-10-12 | Discharge: 2023-10-12 | Disposition: A | Discharge status: Home / Self Care | Source: Ambulatory Visit | Attending: Orthopedic Surgery | Admitting: Orthopedic Surgery

## 2023-10-12 ENCOUNTER — Ambulatory Visit: Payer: Self-pay

## 2023-10-12 DIAGNOSIS — Z96611 Presence of right artificial shoulder joint: Secondary | ICD-10-CM | POA: Insufficient documentation

## 2023-10-17 ENCOUNTER — Ambulatory Visit: Payer: Medicare Other | Admitting: Physical Therapy

## 2023-10-19 ENCOUNTER — Ambulatory Visit: Payer: Medicare Other | Admitting: Physical Therapy

## 2023-10-24 ENCOUNTER — Ambulatory Visit: Payer: Medicare Other | Admitting: Physical Therapy

## 2023-10-26 ENCOUNTER — Ambulatory Visit: Payer: Medicare Other | Admitting: Physical Therapy

## 2023-10-31 ENCOUNTER — Ambulatory Visit: Payer: Medicare Other | Admitting: Physical Therapy

## 2023-11-02 ENCOUNTER — Ambulatory Visit: Admitting: Family

## 2023-11-02 ENCOUNTER — Ambulatory Visit: Payer: Medicare Other | Admitting: Physical Therapy

## 2023-11-02 ENCOUNTER — Encounter: Payer: Self-pay | Admitting: Family

## 2023-11-02 VITALS — BP 118/68 | HR 107 | Ht 64.5 in | Wt 207.0 lb

## 2023-11-02 DIAGNOSIS — E782 Mixed hyperlipidemia: Secondary | ICD-10-CM

## 2023-11-02 DIAGNOSIS — I10 Essential (primary) hypertension: Secondary | ICD-10-CM | POA: Diagnosis not present

## 2023-11-02 DIAGNOSIS — F411 Generalized anxiety disorder: Secondary | ICD-10-CM | POA: Diagnosis not present

## 2023-11-02 MED ORDER — BUSPIRONE HCL 10 MG PO TABS: 10.0000 mg | ORAL_TABLET | Freq: Three times a day (TID) | ORAL | 1 refills | Status: DC | PRN

## 2023-11-07 ENCOUNTER — Ambulatory Visit: Payer: Medicare Other | Admitting: Physical Therapy

## 2023-11-08 ENCOUNTER — Ambulatory Visit: Payer: Medicare Other | Admitting: Dermatology

## 2023-11-09 ENCOUNTER — Ambulatory Visit: Payer: Medicare Other | Admitting: Physical Therapy

## 2023-11-14 ENCOUNTER — Ambulatory Visit: Payer: Medicare Other | Admitting: Physical Therapy

## 2023-11-15 ENCOUNTER — Encounter: Payer: Self-pay | Admitting: Family

## 2023-11-16 ENCOUNTER — Ambulatory Visit: Payer: Medicare Other | Admitting: Physical Therapy

## 2023-11-20 ENCOUNTER — Other Ambulatory Visit: Payer: Self-pay

## 2023-11-20 DIAGNOSIS — I10 Essential (primary) hypertension: Secondary | ICD-10-CM

## 2023-11-21 ENCOUNTER — Other Ambulatory Visit

## 2023-11-21 ENCOUNTER — Ambulatory Visit: Payer: Medicare Other | Admitting: Physical Therapy

## 2023-11-22 LAB — POTASSIUM: Potassium: 4.6 mmol/L (ref 3.5–5.2)

## 2023-11-23 ENCOUNTER — Ambulatory Visit: Payer: Self-pay

## 2023-11-23 ENCOUNTER — Ambulatory Visit: Payer: Medicare Other | Admitting: Physical Therapy

## 2023-11-23 ENCOUNTER — Other Ambulatory Visit: Payer: Self-pay

## 2023-11-23 MED ORDER — TORSEMIDE 10 MG PO TABS
10.0000 mg | ORAL_TABLET | Freq: Every day | ORAL | 1 refills | Status: DC
Start: 2023-11-23 — End: 2023-12-18

## 2023-11-27 ENCOUNTER — Other Ambulatory Visit: Payer: Self-pay | Admitting: Family

## 2023-11-28 ENCOUNTER — Ambulatory Visit: Payer: Medicare Other | Admitting: Physical Therapy

## 2023-11-30 ENCOUNTER — Ambulatory Visit: Payer: Medicare Other | Admitting: Physical Therapy

## 2023-12-05 ENCOUNTER — Ambulatory Visit: Payer: Medicare Other | Admitting: Physical Therapy

## 2023-12-06 ENCOUNTER — Ambulatory Visit (INDEPENDENT_AMBULATORY_CARE_PROVIDER_SITE_OTHER): Admitting: Dermatology

## 2023-12-06 ENCOUNTER — Encounter: Payer: Self-pay | Admitting: Dermatology

## 2023-12-06 DIAGNOSIS — Z79899 Other long term (current) drug therapy: Secondary | ICD-10-CM

## 2023-12-06 DIAGNOSIS — D229 Melanocytic nevi, unspecified: Secondary | ICD-10-CM

## 2023-12-06 DIAGNOSIS — Z7189 Other specified counseling: Secondary | ICD-10-CM

## 2023-12-06 DIAGNOSIS — L570 Actinic keratosis: Secondary | ICD-10-CM | POA: Diagnosis not present

## 2023-12-06 DIAGNOSIS — Z1283 Encounter for screening for malignant neoplasm of skin: Secondary | ICD-10-CM | POA: Diagnosis not present

## 2023-12-06 DIAGNOSIS — L578 Other skin changes due to chronic exposure to nonionizing radiation: Secondary | ICD-10-CM

## 2023-12-06 DIAGNOSIS — L729 Follicular cyst of the skin and subcutaneous tissue, unspecified: Secondary | ICD-10-CM

## 2023-12-06 DIAGNOSIS — D692 Other nonthrombocytopenic purpura: Secondary | ICD-10-CM

## 2023-12-06 DIAGNOSIS — L814 Other melanin hyperpigmentation: Secondary | ICD-10-CM

## 2023-12-06 DIAGNOSIS — L219 Seborrheic dermatitis, unspecified: Secondary | ICD-10-CM

## 2023-12-06 DIAGNOSIS — L218 Other seborrheic dermatitis: Secondary | ICD-10-CM

## 2023-12-06 DIAGNOSIS — Z5111 Encounter for antineoplastic chemotherapy: Secondary | ICD-10-CM

## 2023-12-06 DIAGNOSIS — L720 Epidermal cyst: Secondary | ICD-10-CM

## 2023-12-06 DIAGNOSIS — D1801 Hemangioma of skin and subcutaneous tissue: Secondary | ICD-10-CM | POA: Diagnosis not present

## 2023-12-06 MED ORDER — HYDROCORTISONE 2.5 % EX CREA: TOPICAL_CREAM | CUTANEOUS | 11 refills | Status: AC

## 2023-12-06 MED ORDER — KETOCONAZOLE 2 % EX CREA: TOPICAL_CREAM | CUTANEOUS | 11 refills | Status: AC

## 2023-12-06 NOTE — Progress Notes (Signed)
 Follow-Up Visit   Subjective  Vincent Cole is a 73 y.o. male who presents for the following: Skin Cancer Screening and Full Body Skin Exam Hx of aks Painful spot at left scalp for several months he noticed Also some rough spots at right temple area   The patient presents for Total-Body Skin Exam (TBSE) for skin cancer screening and mole check. The patient has spots, moles and lesions to be evaluated, some may be new or changing and the patient may have concern these could be cancer.  The following portions of the chart were reviewed this encounter and updated as appropriate: medications, allergies, medical history  Review of Systems:  No other skin or systemic complaints except as noted in HPI or Assessment and Plan.  Objective  Well appearing patient in no apparent distress; mood and affect are within normal limits.  A full examination was performed including scalp, head, eyes, ears, nose, lips, neck, chest, axillae, abdomen, back, buttocks, bilateral upper extremities, bilateral lower extremities, hands, feet, fingers, toes, fingernails, and toenails. All findings within normal limits unless otherwise noted below.   Relevant physical exam findings are noted in the Assessment and Plan.      scalp ears and face x 15 (15) Erythematous thin papules/macules with gritty scale.   Assessment & Plan   SKIN CANCER SCREENING PERFORMED TODAY.  LENTIGINES, SEBORRHEIC KERATOSES, HEMANGIOMAS - Benign normal skin lesions - Benign-appearing - Call for any changes  MELANOCYTIC NEVI - Tan-brown and/or pink-flesh-colored symmetric macules and papules - Benign appearing on exam today - Observation - Call clinic for new or changing moles - Recommend daily use of broad spectrum spf 30+ sunscreen to sun-exposed areas.  Purpura - Chronic; persistent and recurrent.  Treatable, but not curable. - Violaceous macules and patches - Benign - Related to trauma, age, sun damage and/or use of  blood thinners, chronic use of topical and/or oral steroids - Observe - Can use OTC arnica containing moisturizer such as Dermend Bruise Formula if desired - Call for worsening or other concerns   EPIDERMAL INCLUSION CYST Exam: Subcutaneous nodule at upper back spinal see photos  Cyst with symptoms and/or recent change.  Discussed surgical excision to remove, including resulting scar and possible recurrence.  Patient will schedule for surgery. Pre-op information given.   SEBORRHEIC DERMATITIS Exam: Pink patches with greasy scale at  eyebrows Chronic and persistent condition with duration or expected duration over one year. Condition is symptomatic/ bothersome to patient. Not currently at goal.  Seborrheic Dermatitis is a chronic persistent rash characterized by pinkness and scaling most commonly of the mid face but also can occur on the scalp (dandruff), ears; mid chest, mid back and groin.  It tends to be exacerbated by stress and cooler weather.  People who have neurologic disease may experience new onset or exacerbation of existing seborrheic dermatitis.  The condition is not curable but treatable and can be controlled. Treatment Plan: Continue  Ketoconazole  cream apply to affected skin at bedtime M W F Restart hc 2.5 % apply to affected skin at face at bedtime  T Th Sat   ACTINIC KERATOSIS (15) scalp ears and face x 15 (15) In 1 month- Start 5-fluorouracil /calcipotriene cream twice a day for 10 days to affected areas including top of scalp. Prescription sent to [facility]. Patient advised they will receive an email to purchase the medication online and have it sent to their home. Patient provided with handout reviewing treatment course and side effects and advised  to call or message us  on MyChart with any concerns.  Reviewed course of treatment and expected reaction.  Patient advised to expect inflammation and crusting and advised that erosions are possible.   Patient advised to be diligent with sun protection during and after treatment. Counseled to keep medication out of reach of children and pets.  ACTINIC DAMAGE WITH PRECANCEROUS ACTINIC KERATOSES Counseling for Topical Chemotherapy Management: Patient exhibits: - Severe, confluent actinic changes with pre-cancerous actinic keratoses that is secondary to cumulative UV radiation exposure over time - Condition that is severe; chronic, not at goal. - diffuse scaly erythematous macules and papules with underlying dyspigmentation - Discussed Prescription Field Treatment topical Chemotherapy for Severe, Chronic Confluent Actinic Changes with Pre-Cancerous Actinic Keratoses Field treatment involves treatment of an entire area of skin that has confluent Actinic Changes (Sun/ Ultraviolet light damage) and PreCancerous Actinic Keratoses by method of PhotoDynamic Therapy (PDT) and/or prescription Topical Chemotherapy agents such as 5-fluorouracil , 5-fluorouracil /calcipotriene, and/or imiquimod.  The purpose is to decrease the number of clinically evident and subclinical PreCancerous lesions to prevent progression to development of skin cancer by chemically destroying early precancer changes that may or may not be visible.  It has been shown to reduce the risk of developing skin cancer in the treated area. As a result of treatment, redness, scaling, crusting, and open sores may occur during treatment course. One or more than one of these methods may be used and may have to be used several times to control, suppress and eliminate the PreCancerous changes. Discussed treatment course, expected reaction, and possible side effects. - Recommend daily broad spectrum sunscreen SPF 30+ to sun-exposed areas, reapply every 2 hours as needed.  - Staying in the shade or wearing long sleeves, sun glasses (UVA+UVB protection) and wide brim hats (4-inch brim around the entire circumference of the hat) are also recommended. - Call  for new or changing lesions.  Actinic keratoses are precancerous spots that appear secondary to cumulative UV radiation exposure/sun exposure over time. They are chronic with expected duration over 1 year. A portion of actinic keratoses will progress to squamous cell carcinoma of the skin. It is not possible to reliably predict which spots will progress to skin cancer and so treatment is recommended to prevent development of skin cancer.  Recommend daily broad spectrum sunscreen SPF 30+ to sun-exposed areas, reapply every 2 hours as needed.  Recommend staying in the shade or wearing long sleeves, sun glasses (UVA+UVB protection) and wide brim hats (4-inch brim around the entire circumference of the hat). Call for new or changing lesions. Destruction of lesion - scalp ears and face x 15 (15) Complexity: simple   Destruction method: cryotherapy   Informed consent: discussed and consent obtained   Timeout:  patient name, date of birth, surgical site, and procedure verified Lesion destroyed using liquid nitrogen: Yes   Region frozen until ice ball extended beyond lesion: Yes   Outcome: patient tolerated procedure well with no complications   Post-procedure details: wound care instructions given    SEBORRHEIC DERMATITIS   Related Medications hydrocortisone  2.5 % cream Apply topically to aa of face t- thur- sat nightly ketoconazole  (NIZORAL ) 2 % cream Apply to affected areas on face at bedtime on M W Fri Return in about 1 year (around 12/05/2024) for TBSE.  IEleanor Blush, CMA, am acting as scribe for Alm Rhyme, MD.   Documentation: I have reviewed the above documentation for accuracy and completeness, and I agree with the above.  Alm Rhyme,  MD

## 2023-12-06 NOTE — Patient Instructions (Addendum)
 Pre-Operative Instructions You are scheduled for a surgical procedure at [facility]. We recommend you read the following instructions. If you have any questions or concerns, please call the office at [phone].  Shower and wash the entire body with soap and water  the day of your surgery paying special attention to cleansing at and around the planned surgery site.  Please continue to take your anticoagulants (blood thinners) as you normally     would before and after surgery if they were prescribed by a medical provider. Stopping them could be harmful to you. We have multiple tools in dermatology to stop the bleeding even if you take an anticoagulant. If you take over the counter blood thinner such as aspirin, Ibuprofen (Motrin, Advil and Nuprin), Naprosyn, Voltaren, Relafen, etc. that was not prescribed or recommended by a medical provider, we recommend that you stop taking it for a week before your surgery and wait to restart until 2 days after your surgery.  Please inform us  of all medications you are currently taking. All medications that are taken regularly should be taken the day of surgery as you always do. Nevertheless, we need to be informed of what medications you are taking prior to surgery to know whether they will affect the procedure or cause any complications.   Please inform us  of any medication allergies. Also inform us  of whether you have allergies to Latex or rubber products or whether you have had any adverse reaction to Lidocaine  or Epinephrine.  Please inform us  of any prosthetic or artificial body parts such as artificial heart valve, joint replacements, etc., or similar condition that might require preoperative antibiotics.   We recommend avoidance of alcohol at least two weeks prior to surgery and continued avoidance for at least two weeks after surgery.   We recommend discontinuation of tobacco smoking at least two weeks prior to surgery and continued  abstinence for at least two weeks after surgery.  Do not plan strenuous exercise, strenuous work or strenuous lifting for approximately four weeks after your surgery.   We request if you are unable to make your scheduled surgical appointment, please call us  at least a week in advance or as soon as you are aware of a problem so that we can cancel or reschedule the appointment.   You MAY TAKE TYLENOL  (acetaminophen ) for pain as it is not a blood thinner.   PLEASE PLAN TO BE IN TOWN FOR TWO WEEKS FOLLOWING SURGERY, THIS IS IMPORTANT SO YOU CAN BE CHECKED FOR DRESSING CHANGES, FUTURE REMOVAL AND TO MONITOR FOR POSSIBLE COMPLICATIONS.       For bruising  Can use OTC arnica cream or pills  containing moisturizer such as Dermend Bruise Formula if desired     In 1 month  - Start 5-fluorouracil /calcipotriene cream twice a day for 10 days to affected areas including top of scalp. Prescription sent to [facility]. Patient advised they will receive an email to purchase the medication online and have it sent to their home. Patient provided with handout reviewing treatment course and side effects and advised to call or message us  on MyChart with any concerns.  Reviewed course of treatment and expected reaction.  Patient advised to expect inflammation and crusting and advised that erosions are possible.  Patient advised to be diligent with sun protection during and after treatment. Counseled to keep medication out of reach of children and pets.  Instructions for [facility]  One or more of your medications was  sent to the [facility] mail order [facility] pharmacy. You will receive an email from them and can purchase the medicine through that link. It will then be mailed to your home at the address you confirmed. If for any reason you do not receive an email from them, please check your spam folder. If you still do not find the email, please let us  know. [facility] phone number is [phone].    5-Fluorouracil /Calcipotriene Patient Education   Actinic keratoses are the dry, red scaly spots on the skin caused by sun damage. A portion of these spots can turn into skin cancer with time, and treating them can help prevent development of skin cancer.   Treatment of these spots requires removal of the defective skin cells. There are various ways to remove actinic keratoses, including freezing with liquid nitrogen, treatment with creams, or treatment with a blue light procedure in the office.   5-fluorouracil  cream is a topical cream used to treat actinic keratoses. It works by interfering with the growth of abnormal fast-growing skin cells, such as actinic keratoses. These cells peel off and are replaced by healthy ones.   5-fluorouracil /calcipotriene is a combination of the 5-fluorouracil  cream with a vitamin D  analog cream called calcipotriene. The calcipotriene alone does not treat actinic keratoses. However, when it is combined with 5-fluorouracil , it helps the 5-fluorouracil  treat the actinic keratoses much faster so that the same results can be achieved with a much shorter treatment time.  INSTRUCTIONS FOR 5-FLUOROURACIL /CALCIPOTRIENE CREAM:   5-fluorouracil /calcipotriene cream typically only needs to be used for 4-7 days. A thin layer should be applied twice a day to the treatment areas recommended by your physician.   If your physician prescribed you separate tubes of 5-fluourouracil and calcipotriene, apply a thin layer of 5-fluorouracil  followed by a thin layer of calcipotriene.   Avoid contact with your eyes, nostrils, and mouth. Do not use 5-fluorouracil /calcipotriene cream on infected or open wounds.   You will develop redness, irritation and some crusting at areas where you have pre-cancer damage/actinic keratoses. IF YOU DEVELOP PAIN, BLEEDING, OR SIGNIFICANT CRUSTING, STOP THE TREATMENT EARLY - you have already gotten a good  response and the actinic keratoses should clear up well.  Wash your hands after applying 5-fluorouracil  5% cream on your skin.   A moisturizer or sunscreen with a minimum SPF 30 should be applied each morning.   Once you have finished the treatment, you can apply a thin layer of Vaseline twice a day to irritated areas to soothe and calm the areas more quickly. If you experience significant discomfort, contact your physician.  For some patients it is necessary to repeat the treatment for best results.  SIDE EFFECTS: When using 5-fluorouracil /calcipotriene cream, you may have mild irritation, such as redness, dryness, swelling, or a mild burning sensation. This usually resolves within 2 weeks. The more actinic keratoses you have, the more redness and inflammation you can expect during treatment. Eye irritation has been reported rarely. If this occurs, please let us  know.  If you have any trouble using this cream, please call the office. If you have any other questions about this information, please do not hesitate to ask me before you leave the office.     Actinic keratoses are precancerous spots that appear secondary to cumulative UV radiation exposure/sun exposure over time. They are chronic with expected duration over 1 year. A portion of actinic keratoses will progress to squamous cell carcinoma of the skin. It is not possible to  reliably predict which spots will progress to skin cancer and so treatment is recommended to prevent development of skin cancer.  Recommend daily broad spectrum sunscreen SPF 30+ to sun-exposed areas, reapply every 2 hours as needed.  Recommend staying in the shade or wearing long sleeves, sun glasses (UVA+UVB protection) and wide brim hats (4-inch brim around the entire circumference of the hat). Call for new or changing lesions.   Cryotherapy Aftercare  Wash gently with soap and water  everyday.   Apply Vaseline and Band-Aid daily until healed.     Melanoma  ABCDEs  Melanoma is the most dangerous type of skin cancer, and is the leading cause of death from skin disease.  You are more likely to develop melanoma if you: Have light-colored skin, light-colored eyes, or red or blond hair Spend a lot of time in the sun Tan regularly, either outdoors or in a tanning bed Have had blistering sunburns, especially during childhood Have a close family member who has had a melanoma Have atypical moles or large birthmarks  Early detection of melanoma is key since treatment is typically straightforward and cure rates are extremely high if we catch it early.   The first sign of melanoma is often a change in a mole or a new dark spot.  The ABCDE system is a way of remembering the signs of melanoma.  A for asymmetry:  The two halves do not match. B for border:  The edges of the growth are irregular. C for color:  A mixture of colors are present instead of an even brown color. D for diameter:  Melanomas are usually (but not always) greater than 6mm - the size of a pencil eraser. E for evolution:  The spot keeps changing in size, shape, and color.  Please check your skin once per month between visits. You can use a small mirror in front and a large mirror behind you to keep an eye on the back side or your body.   If you see any new or changing lesions before your next follow-up, please call to schedule a visit.  Please continue daily skin protection including broad spectrum sunscreen SPF 30+ to sun-exposed areas, reapplying every 2 hours as needed when you're outdoors.   Staying in the shade or wearing long sleeves, sun glasses (UVA+UVB protection) and wide brim hats (4-inch brim around the entire circumference of the hat) are also recommended for sun protection.    Due to recent changes in healthcare laws, you may see results of your pathology and/or laboratory studies on MyChart before the doctors have had a chance to review them. We understand that in some  cases there may be results that are confusing or concerning to you. Please understand that not all results are received at the same time and often the doctors may need to interpret multiple results in order to provide you with the best plan of care or course of treatment. Therefore, we ask that you please give us  2 business days to thoroughly review all your results before contacting the office for clarification. Should we see a critical lab result, you will be contacted sooner.   If You Need Anything After Your Visit  If you have any questions or concerns for your doctor, please call our main line at [phone] and press option 4 to reach your doctor's medical assistant. If no one answers, please leave a voicemail as directed and we will return your call as soon as possible. Messages left after 4 pm  will be answered the following business day.   You may also send us  a message via MyChart. We typically respond to MyChart messages within 1-2 business days.  For prescription refills, please ask your pharmacy to contact our office. Our fax number is [phone].  If you have an urgent issue when the clinic is closed that cannot wait until the next business day, you can page your doctor at the number below.    Please note that while we do our best to be available for urgent issues outside of office hours, we are not available 24/7.   If you have an urgent issue and are unable to reach us , you may choose to seek medical care at your doctor's office, retail clinic, urgent care center, or emergency room.  If you have a medical emergency, please immediately call 911 or go to the emergency department.  Pager Numbers  - Dr. Hester: [phone]  - Dr. Jackquline: [phone]  - Dr. Claudene: [phone]   In the event of inclement weather, please call our main line at [phone] for an update on the status of any delays or closures.  Dermatology Medication Tips: Please keep the boxes that  topical medications come in in order to help keep track of the instructions about where and how to use these. Pharmacies typically print the medication instructions only on the boxes and not directly on the medication tubes.   If your medication is too expensive, please contact our office at [phone] option 4 or send us  a message through MyChart.   We are unable to tell what your co-pay for medications will be in advance as this is different depending on your insurance coverage. However, we may be able to find a substitute medication at lower cost or fill out paperwork to get insurance to cover a needed medication.   If a prior authorization is required to get your medication covered by your insurance company, please allow us  1-2 business days to complete this process.  Drug prices often vary depending on where the prescription is filled and some pharmacies may offer cheaper prices.  The website www.goodrx.com contains coupons for medications through different pharmacies. The prices here do not account for what the cost may be with help from insurance (it may be cheaper with your insurance), but the website can give you the price if you did not use any insurance.  - You can print the associated coupon and take it with your prescription to the pharmacy.  - You may also stop by our office during regular business hours and pick up a GoodRx coupon card.  - If you need your prescription sent electronically to a different pharmacy, notify our office through [facility] or by phone at [phone] option 4.     Si Usted Necesita Algo Despus de Su Visita  Tambin puede enviarnos un mensaje a travs de Clinical cytogeneticist. Por lo general respondemos a los mensajes de MyChart en el transcurso de 1 a 2 das hbiles.  Para renovar recetas, por favor pida a su farmacia que se ponga en contacto con nuestra oficina. Randi lakes de fax es [place] [phone].  Si tiene un asunto urgente cuando la clnica  est cerrada y que no puede esperar hasta el siguiente da hbil, puede llamar/localizar a su doctor(a) al nmero que aparece a continuacin.   Por favor, tenga en cuenta que aunque hacemos todo lo posible para estar disponibles para asuntos urgentes fuera del horario de oficina, no estamos disponibles las 24  horas del Futures trader, los 7 [address]  [address] de la [place].   Si tiene un problema urgente y no puede comunicarse con nosotros, puede optar por buscar atencin mdica  en el consultorio de su doctor(a), en una clnica privada, en un centro de atencin urgente o en una sala de emergencias.  Si tiene Engineer, drilling, por favor llame inmediatamente al 911 o vaya a la sala de emergencias.  Nmeros de bper  - Dr. Hester: [phone]  - Dra. Jackquline: [phone]  - Dr. Claudene: [phone]   En caso de inclemencias del tiempo, por favor llame a landry capes principal al [phone] para una actualizacin sobre el [place] de cualquier retraso o cierre.  Consejos para la medicacin en dermatologa: Por favor, guarde las cajas en las que vienen los medicamentos de uso tpico para ayudarle a seguir las instrucciones sobre dnde y cmo usarlos. Las farmacias generalmente imprimen las instrucciones del medicamento slo en las cajas y no directamente en los tubos del [place].   Si su medicamento es muy caro, por favor, pngase en contacto con landry rieger llamando al [phone] y presione la opcin 4 o envenos un mensaje a travs de Clinical cytogeneticist.   No podemos decirle cul ser su copago por los medicamentos por adelantado ya que esto es diferente dependiendo de la cobertura de su seguro. Sin embargo, es posible que podamos encontrar un medicamento sustituto a Audiological scientist un formulario para que el seguro cubra el medicamento que se considera necesario.   Si se requiere una autorizacin previa para que su compaa de seguros malta su medicamento, por favor permtanos de 1 a 2 das hbiles para  completar este proceso.  Los precios de los medicamentos varan con frecuencia dependiendo del Environmental consultant de dnde se surte la receta y alguna farmacias pueden ofrecer precios ms baratos.  El sitio web www.goodrx.com tiene cupones para medicamentos de Health and safety inspector. Los precios aqu no tienen en cuenta lo que podra costar con la ayuda del seguro (puede ser ms barato con su seguro), pero el sitio web puede darle el precio si no utiliz Tourist information centre manager.  - Puede imprimir el cupn correspondiente y llevarlo con su receta a la farmacia.  - Tambin puede pasar por nuestra oficina durante el horario de atencin regular y Education officer, museum una tarjeta de cupones de GoodRx.  - Si necesita que su receta se enve electrnicamente a una farmacia diferente, informe a nuestra oficina a travs de MyChart de  o por telfono llamando al [phone] y presione la opcin 4.

## 2023-12-07 ENCOUNTER — Ambulatory Visit: Payer: Medicare Other | Admitting: Physical Therapy

## 2023-12-13 ENCOUNTER — Encounter: Admitting: Dermatology

## 2023-12-17 ENCOUNTER — Other Ambulatory Visit: Payer: Self-pay | Admitting: Family

## 2023-12-18 ENCOUNTER — Other Ambulatory Visit: Payer: Self-pay | Admitting: Medical Genetics

## 2023-12-18 ENCOUNTER — Encounter: Payer: Self-pay | Admitting: Family

## 2023-12-19 ENCOUNTER — Other Ambulatory Visit
Admission: RE | Admit: 2023-12-19 | Discharge: 2023-12-19 | Disposition: A | Discharge status: Home / Self Care | Payer: Self-pay | Source: Ambulatory Visit | Attending: Medical Genetics | Admitting: Medical Genetics

## 2023-12-20 ENCOUNTER — Encounter: Payer: Self-pay | Admitting: Internal Medicine

## 2023-12-21 ENCOUNTER — Other Ambulatory Visit: Payer: Self-pay | Admitting: Cardiology

## 2023-12-21 DIAGNOSIS — Z8619 Personal history of other infectious and parasitic diseases: Secondary | ICD-10-CM

## 2023-12-22 ENCOUNTER — Other Ambulatory Visit

## 2023-12-26 LAB — TREPONEMAL ANTIBODIES, TPPA: Treponemal Antibodies, TPPA: NONREACTIVE

## 2023-12-26 LAB — RPR W/REFLEX TO TREPSURE: RPR: NONREACTIVE

## 2023-12-27 ENCOUNTER — Ambulatory Visit: Payer: Self-pay | Admitting: Cardiology

## 2023-12-27 ENCOUNTER — Telehealth: Payer: Self-pay

## 2023-12-27 NOTE — Telephone Encounter (Signed)
 error

## 2023-12-27 NOTE — Progress Notes (Signed)
 Left message to return call

## 2023-12-30 ENCOUNTER — Encounter: Payer: Self-pay | Admitting: Family

## 2023-12-30 DIAGNOSIS — I82409 Acute embolism and thrombosis of unspecified deep veins of unspecified lower extremity: Secondary | ICD-10-CM | POA: Insufficient documentation

## 2023-12-30 NOTE — Progress Notes (Signed)
 Established Patient Office Visit  Subjective:  Patient ID: Vincent Cole, male    DOB: July 01, 1950  Age: 73 y.o. MRN: [id number]  Chief Complaint  Patient presents with   Follow-up    Kidney and liver tests    Patient here today for follow up.  He went to his ortho provider and they found that he had some elevated kidney and liver labs.  He asks if we can recheck these today.   He also says that he has been using his CPAP.  He is compliant with and benefitting from therapy.   No other concerns at this time.    Past Medical History:  Diagnosis Date   Abdominal pain 01/17/2011   Formatting of this note might be different from the original.  Note: Unchanged - chronic  suprapubic/lower abdominal area since TURP in 2005; urology has now diagnosed, after extensive evaluation, interstitial cystitis; has also had repeat TURP in June 2010, and a surgery on the neck of the bladder in July of 2011     Actinic keratosis    Arthritis    Benign bladder mass    Complete tear of left rotator cuff 04/19/2017   Complication of anesthesia    PONV x 1 in 2010   Deep venous thrombosis (HCC) 12/30/2023   Depression    Frequency of urination    GERD (gastroesophageal reflux disease) no meds   Heart murmur    was told many years ago   History of DVT of lower extremity 02/03/2020   History of recurrent UTIs    Hypertension    IC (interstitial cystitis)    Nocturia    OSA on CPAP nightly   Pelvic pain in male    PONV (postoperative nausea and vomiting)    Simple renal cyst    Urgency of urination    Urinary hesitancy     Past Surgical History:  Procedure Laterality Date   BUNIONECTOMY Right 01/15/2021   Procedure: ZELL HAI;  Surgeon: Ashley Soulier, DPM;  Location: [facility];  Service: Podiatry;  Laterality: Right;   COLONOSCOPY     COLONOSCOPY WITH PROPOFOL  N/A 06/25/2021   Procedure: COLONOSCOPY WITH PROPOFOL ;  Surgeon: Maryruth Ole DASEN, MD;  Location: [facility]  ENDOSCOPY;  Service: Endoscopy;  Laterality: N/A;   CORRECTION OVERLAPPING TOES Right 01/15/2021   Procedure: CORRECTION OVERLAPPING TOES;  Surgeon: Ashley Soulier, DPM;  Location: [facility];  Service: Podiatry;  Laterality: Right;   CYSTO WITH HYDRODISTENSION  06/02/2011   Procedure: CYSTOSCOPY/HYDRODISTENSION;  Surgeon: Glendia DELENA Elizabeth, MD;  Location: [facility];  Service: Urology;  Laterality: N/A;  catheter placement   HAMMER TOE SURGERY Right 01/15/2021   Procedure: HAMMER TOE CORRECTION;  Surgeon: Ashley Soulier, DPM;  Location: [facility];  Service: Podiatry;  Laterality: Right;   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   JOINT REPLACEMENT     shoulder, left knee   KNEE ARTHROSCOPY     TRANSURETHRAL RESECTION OF PROSTATE  X3   LAST ONE MAY 2012   WEIL OSTEOTOMY Right 01/15/2021   Procedure: WEIL;  Surgeon: Ashley Soulier, DPM;  Location: [facility];  Service: Podiatry;  Laterality: Right;    Social History   Socioeconomic History   Marital status: Married    Spouse name: Not on file   Number of children: 1   Years of education: Not on file   Highest education level: Not on file  Occupational History   Not on file  Tobacco Use  Smoking status: Never   Smokeless tobacco: Never  Vaping Use   Vaping status: Never Used  Substance and Sexual Activity   Alcohol use: Yes    Comment: occ   Drug use: No   Sexual activity: Not on file  Other Topics Concern   Not on file  Social History Narrative   Not on file   Social Drivers of Health   Financial Resource Strain: Not on file  Food Insecurity: No Food Insecurity (05/22/2023)   Hunger Vital Sign    Worried About Running Out of Food in the Last Year: Never true    Ran Out of Food in the Last Year: Never true  Transportation Needs: No Transportation Needs (05/22/2023)   PRAPARE - Administrator, Civil Service (Medical): No    Lack of Transportation (Non-Medical): No  Physical Activity: Not on  file  Stress: No Stress Concern Present (05/22/2023)   [employer] of Occupational Health - Occupational Stress Questionnaire    Feeling of Stress : Only a little  Social Connections: Not on file  Intimate Partner Violence: Not At Risk (05/22/2023)   Humiliation, Afraid, Rape, and Kick questionnaire    Fear of Current or Ex-Partner: No    Emotionally Abused: No    Physically Abused: No    Sexually Abused: No    Family History  Problem Relation Age of Onset   Heart disease Mother     No Known Allergies  Review of Systems  All other systems reviewed and are negative.      Objective:   BP 120/80   Pulse 94   Ht 5' 4.5 (1.638 m)   Wt 202 lb 9.6 oz (91.9 kg)   SpO2 95%   BMI 34.24 kg/m   Vitals:   09/29/23 1451  BP: 120/80  Pulse: 94  Height: 5' 4.5 (1.638 m)  Weight: 202 lb 9.6 oz (91.9 kg)  SpO2: 95%  BMI (Calculated): 34.25    Physical Exam Vitals and nursing note reviewed.  Constitutional:      General: He is awake.     Appearance: Normal appearance. He is well-developed and well-groomed. He is obese.  HENT:     Nose: Nose normal.  Eyes:     Extraocular Movements: Extraocular movements intact.     Conjunctiva/sclera: Conjunctivae normal.     Pupils: Pupils are equal, round, and reactive to light.  Cardiovascular:     Rate and Rhythm: Normal rate and regular rhythm.     Pulses: Normal pulses.     Heart sounds: Normal heart sounds.  Pulmonary:     Effort: Pulmonary effort is normal.     Breath sounds: Normal breath sounds.  Musculoskeletal:        General: Tenderness present.     Cervical back: Normal range of motion.  Neurological:     General: No focal deficit present.     Mental Status: He is alert. Mental status is at baseline.     Gait: Gait abnormal.  Psychiatric:        Mood and Affect: Mood normal.        Behavior: Behavior is cooperative.        Thought Content: Thought content normal.        Judgment: Judgment normal.       Results for orders placed or performed in visit on 09/29/23  CBC with Diff  Result Value Ref Range   WBC 7.4 3.4 - 10.8 x10E3/uL   RBC 4.45  4.14 - 5.80 x10E6/uL   Hemoglobin 13.0 13.0 - 17.7 g/dL   Hematocrit 59.8 62.4 - 51.0 %   MCV 90 79 - 97 fL   MCH 29.2 26.6 - 33.0 pg   MCHC 32.4 31.5 - 35.7 g/dL   RDW 86.8 88.3 - 84.5 %   Platelets 428 150 - 450 x10E3/uL   Neutrophils 67 Not Estab. %   Lymphs 20 Not Estab. %   Monocytes 10 Not Estab. %   Eos 3 Not Estab. %   Basos 0 Not Estab. %   Neutrophils Absolute 4.9 1.4 - 7.0 x10E3/uL   Lymphocytes Absolute 1.5 0.7 - 3.1 x10E3/uL   Monocytes Absolute 0.8 0.1 - 0.9 x10E3/uL   EOS (ABSOLUTE) 0.2 0.0 - 0.4 x10E3/uL   Basophils Absolute 0.0 0.0 - 0.2 x10E3/uL   Immature Granulocytes 0 Not Estab. %   Immature Grans (Abs) 0.0 0.0 - 0.1 x10E3/uL  CMP14+EGFR  Result Value Ref Range   Glucose 87 70 - 99 mg/dL   BUN 15 8 - 27 mg/dL   Creatinine, Ser 9.07 0.76 - 1.27 mg/dL   eGFR 87 >40 fO/fpw/8.26   BUN/Creatinine Ratio 16 10 - 24   Sodium 134 134 - 144 mmol/L   Potassium 4.8 3.5 - 5.2 mmol/L   Chloride 99 96 - 106 mmol/L   CO2 21 20 - 29 mmol/L   Calcium 9.2 8.6 - 10.2 mg/dL   Total Protein 6.9 6.0 - 8.5 g/dL   Albumin 4.1 3.8 - 4.8 g/dL   Globulin, Total 2.8 1.5 - 4.5 g/dL   Bilirubin Total <9.7 0.0 - 1.2 mg/dL   Alkaline Phosphatase 129 (H) 44 - 121 IU/L   AST 23 0 - 40 IU/L   ALT 21 0 - 44 IU/L  Rheumatoid Arthritis Profile  Result Value Ref Range   Rheumatoid fact SerPl-aCnc 10.7 <14.0 IU/mL   Cyclic Citrullin Peptide Ab 10 0 - 19 units  ANA 12 Profile, Do All RDL  Result Value Ref Range   Anti-Nuclear Ab by IFA (RDL) Positive (A) Negative   Anti-Centromere Ab (RDL) <1:40 <1:40   Anti-dsDNA Ab by Farr(RDL) <8.0 <8.0 IU/mL   Anti-Sm Ab (RDL) <20 <20 Units   Anti-U1 RNP Ab (RDL) 25 (H) <20 Units   Anti-Ro (SS-A) Ab (RDL) <20 <20 Units   Anti-La (SS-B) Ab (RDL) <20 <20 Units   Anti-Scl-70 Ab (RDL) <20 <20 Units    Anti-Cardiolipin Ab, IgG (RDL) <15 <15 GPL U/mL   Anti-Cardiolipin Ab, IgA (RDL) <12 <12 APL U/mL   Anti-Cardiolipin Ab, IgM (RDL) <13 <13 MPL U/mL   C3 Complement (RDL) 141 90 - 180 mg/dL   C4 Complement (RDL) 25 10 - 40 mg/dL   Anti-TPO Ab (RDL) <0.9 <9.0 IU/mL  PSA  Result Value Ref Range   Prostate Specific Ag, Serum 0.9 0.0 - 4.0 ng/mL  ANA Titer and Pattern  Result Value Ref Range   Speckled Pattern 1:40 (H) <1:40   Midbody Pattern 1:40 (H) <1:40   Note: Comment     Recent Results (from the past 2160 hours)  Potassium     Status: None   Collection Time: 11/21/23  9:56 AM  Result Value Ref Range   Potassium 4.6 3.5 - 5.2 mmol/L  RPR w/reflex to TrepSure     Status: None   Collection Time: 12/22/23 10:13 AM  Result Value Ref Range   RPR Non Reactive Non Reactive  Treponemal Antibodies, TPPA     Status: None  Collection Time: 12/22/23 10:13 AM  Result Value Ref Range   Treponemal Antibodies, TPPA Non Reactive Non Reactive   Interpretation: Comment     Comment: Syphilis: Treponemal Antibodies with Reflex to RPR and RPR           Titer, Reverse Screening and Diagnosis Algorithm ------------------------------------------------------------ Treponemal              Treponemal     Ab       RPR, Qn     Ab, TPPA    Final Interpretation ----------   --------   ----------   ----------------------- Non          N/A        N/A          No laboratory evidence Reactive                             of syphilis. Retest in                                      2-4 weeks if recent                                      exposure is suspected. ----------   --------   ----------   ----------------------- Reactive     Non        Non          Treponemal antibodies              Reactive   Reactive     not confirmed.                                      Inconclusive for                                      syphilis; potential                                      early syphilis,                                       possible false                                       positive. Retest in 2-4                                      weeks if recent                                      exposure is suspected. ----------   --------   ----------   ----------------------- Reactive  Non        Reactive     Treponemal antibodies              Reactive                detected. Consistent                                      with past or current                                      (potential early)                                      syphilis. ----------   --------   ----------   ----------------------- Reactive     >/=1:1     N/A          Treponemal and                                      nontreponemal                                      antibodies detected.                                      Consistent with current                                      or past syphilis. This test is intended ONLY for specimens that have tested positive (reactive) or equivocal for Treponema pallidum antibodies prior to submission for testing.  For the full CDC-recommended syphilis screening and diagnosis algorithm, [facility] offers test code 012005 RPR, Rfx Qn RPR/Confirm TP or 917654 T pallidum Screening Cascade.        Assessment & Plan Arthritis Abnormal C-reactive protein Checking ANA and RA Panel today.  Will call with results when available.   Right lower quadrant abdominal pain Urinary hesitancy UA in office today.   OSA (obstructive sleep apnea) Patient is compliant with and benefiting from CPAP therapy.  Continue therapy.      Return as previously scheduled with Dr. Fernand.   Total time spent: 20 minutes  ALAN CHRISTELLA ARRANT, FNP  09/29/2023   This document may have been prepared by [facility] Voice Recognition software and as such may include unintentional dictation errors.

## 2023-12-30 NOTE — Assessment & Plan Note (Addendum)
 Patient is compliant with and benefiting from CPAP therapy.  Continue therapy.

## 2023-12-30 NOTE — Assessment & Plan Note (Signed)
 Checking ANA and RA Panel today.  Will call with results when available.

## 2023-12-31 LAB — GENECONNECT MOLECULAR SCREEN: Genetic Analysis Overall Interpretation: NEGATIVE

## 2024-01-03 NOTE — Progress Notes (Signed)
 Established Patient Office Visit  Subjective:  Patient ID: Vincent Cole, male    DOB: 05-23-1951  Age: 73 y.o. MRN: [id number]  Chief Complaint  Patient presents with   Follow-up    Discuss lab results    Patient here today to discuss his lab results.  He is feeling okay, lab results were stable.     No other concerns at this time.   Past Medical History:  Diagnosis Date   Abdominal pain 01/17/2011   Formatting of this note might be different from the original.  Note: Unchanged - chronic  suprapubic/lower abdominal area since TURP in 2005; urology has now diagnosed, after extensive evaluation, interstitial cystitis; has also had repeat TURP in June 2010, and a surgery on the neck of the bladder in July of 2011     Actinic keratosis    Arthritis    Benign bladder mass    Complete tear of left rotator cuff 04/19/2017   Complication of anesthesia    PONV x 1 in 2010   Deep venous thrombosis (HCC) 12/30/2023   Depression    Frequency of urination    GERD (gastroesophageal reflux disease) no meds   Heart murmur    was told many years ago   History of DVT of lower extremity 02/03/2020   History of recurrent UTIs    Hypertension    IC (interstitial cystitis)    Nocturia    OSA on CPAP nightly   Pelvic pain in male    PONV (postoperative nausea and vomiting)    Simple renal cyst    Urgency of urination    Urinary hesitancy     Past Surgical History:  Procedure Laterality Date   BUNIONECTOMY Right 01/15/2021   Procedure: ZELL HAI;  Surgeon: Ashley Soulier, DPM;  Location: [facility];  Service: Podiatry;  Laterality: Right;   COLONOSCOPY     COLONOSCOPY WITH PROPOFOL  N/A 06/25/2021   Procedure: COLONOSCOPY WITH PROPOFOL ;  Surgeon: Maryruth Ole DASEN, MD;  Location: [facility];  Service: Endoscopy;  Laterality: N/A;   CORRECTION OVERLAPPING TOES Right 01/15/2021   Procedure: CORRECTION OVERLAPPING TOES;  Surgeon: Ashley Soulier, DPM;  Location: [facility];  Service: Podiatry;  Laterality: Right;   CYSTO WITH HYDRODISTENSION  06/02/2011   Procedure: CYSTOSCOPY/HYDRODISTENSION;  Surgeon: Glendia DELENA Elizabeth, MD;  Location: [facility];  Service: Urology;  Laterality: N/A;  catheter placement   HAMMER TOE SURGERY Right 01/15/2021   Procedure: HAMMER TOE CORRECTION;  Surgeon: Ashley Soulier, DPM;  Location: [facility];  Service: Podiatry;  Laterality: Right;   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   JOINT REPLACEMENT     shoulder, left knee   KNEE ARTHROSCOPY     TRANSURETHRAL RESECTION OF PROSTATE  X3   LAST ONE MAY 2012   WEIL OSTEOTOMY Right 01/15/2021   Procedure: WEIL;  Surgeon: Ashley Soulier, DPM;  Location: [facility];  Service: Podiatry;  Laterality: Right;    Social History   Socioeconomic History   Marital status: Married    Spouse name: Not on file   Number of children: 1   Years of education: Not on file   Highest education level: Not on file  Occupational History   Not on file  Tobacco Use   Smoking status: Never   Smokeless tobacco: Never  Vaping Use   Vaping status: Never Used  Substance and Sexual Activity   Alcohol use: Yes    Comment: occ   Drug use: No  Sexual activity: Not on file  Other Topics Concern   Not on file  Social History Narrative   Not on file   Social Drivers of Health   Financial Resource Strain: Not on file  Food Insecurity: No Food Insecurity (05/22/2023)   Hunger Vital Sign    Worried About Running Out of Food in the Last Year: Never true    Ran Out of Food in the Last Year: Never true  Transportation Needs: No Transportation Needs (05/22/2023)   PRAPARE - Administrator, Civil Service (Medical): No    Lack of Transportation (Non-Medical): No  Physical Activity: Not on file  Stress: No Stress Concern Present (05/22/2023)   [employer] of Occupational Health - Occupational Stress Questionnaire    Feeling of Stress : Only a little  Social  Connections: Not on file  Intimate Partner Violence: Not At Risk (05/22/2023)   Humiliation, Afraid, Rape, and Kick questionnaire    Fear of Current or Ex-Partner: No    Emotionally Abused: No    Physically Abused: No    Sexually Abused: No    Family History  Problem Relation Age of Onset   Heart disease Mother     No Known Allergies  Review of Systems  All other systems reviewed and are negative.      Objective:   BP 118/68   Pulse (!) 107   Ht 5' 4.5 (1.638 m)   Wt 207 lb (93.9 kg)   SpO2 95%   BMI 34.98 kg/m   Vitals:   11/02/23 1136  BP: 118/68  Pulse: (!) 107  Height: 5' 4.5 (1.638 m)  Weight: 207 lb (93.9 kg)  SpO2: 95%  BMI (Calculated): 35    Physical Exam Vitals and nursing note reviewed.  Constitutional:      Appearance: Normal appearance. He is normal weight.  Eyes:     Pupils: Pupils are equal, round, and reactive to light.  Cardiovascular:     Rate and Rhythm: Normal rate and regular rhythm.     Pulses: Normal pulses.     Heart sounds: Normal heart sounds.  Pulmonary:     Effort: Pulmonary effort is normal.     Breath sounds: Normal breath sounds.  Neurological:     Mental Status: He is alert.  Psychiatric:        Attention and Perception: Attention normal.        Mood and Affect: Mood is anxious.        Speech: Speech normal.        Behavior: Behavior normal. Behavior is cooperative.        Thought Content: Thought content normal.        Cognition and Memory: Cognition normal. He exhibits impaired recent memory.        Judgment: Judgment normal.      No results found for any visits on 11/02/23.  Recent Results (from the past 2160 hours)  Potassium     Status: None   Collection Time: 11/21/23  9:56 AM  Result Value Ref Range   Potassium 4.6 3.5 - 5.2 mmol/L  GeneConnect Molecular Screen - Blood ([facility])     Status: None   Collection Time: 12/19/23  3:45 PM  Result Value Ref Range   Genetic Analysis Overall  Interpretation Negative    Genetic Disease Assessed      This is a screening test and does not detect all pathogenic or likely pathogenic variant(s) in the tested  genes; diagnostic testing is recommended for individuals with a personal or family history of heart disease or hereditary cancer. [facility] Tier One  Population Screen is a screening test that analyzes 11 genes related to hereditary breast and ovarian cancer (HBOC) syndrome, Lynch syndrome, and familial hypercholesterolemia. This test only reports clinically significant pathogenic and likely  pathogenic variants but does not report variants of uncertain significance (VUS). In addition, analysis of the PMS2 gene excludes exons 11-15, which overlap with a known pseudogene (PMS2CL).    Genetic Analysis Report      No pathogenic or likely pathogenic variants were detected in the genes analyzed by this test.Genetic test results should be interpreted in the context of an individual's personal medical and family history. Alteration to medical management is NOT  recommended based solely on this result. Clinical correlation is advised.Additional Considerations- This is a screening test; individuals may still carry pathogenic or likely pathogenic variant(s) in the tested genes that are not detected by this test.-  For individuals at risk for these or other related conditions based on factors including personal or family history, diagnostic testing is recommended.- The absence of pathogenic or likely pathogenic variant(s) in the analyzed genes, while reassuring,  does not eliminate the possibility of a hereditary condition; there are other variants and genes associated with heart disease and hereditary cancer that are not included in this test.    Genes Tested See Notes     Comment: APOB, BRCA1, BRCA2, EPCAM, LDLR, LDLRAP1, PCSK9, PMS2, MLH1, MSH2, MSH6   Disclaimer See Notes     Comment: This test was developed and validated by [facility] This test has  not been cleared or approved by the [place]  Food and Drug Administration (FDA). The [facility] laboratory is accredited by the College of American Pathologists (CAP) and certified under  the Clinical Laboratory Improvement Amendments (CLIA #: [id number]) to perform high-complexity clinical tests. This test is used for clinical purposes. It should not be regarded as investigational or for research.    Sequencing Location See Notes     Comment: Sequencing done at [facility] [address], [address], [place] [address] (CLIA# [id number])   Interpretation Methods and Limitations See Notes     Comment: Extracted DNA is enriched for targeted regions and then sequenced using the [facility] Exome+ (R) assay on an Illumina DNA sequencing system. Data is then aligned to a modified version of GRCh38 and all genes are analyzed using the MANE transcript and MANE  Plus Clinical transcript, when available. Small variant calling is completed using a customized version of [employer]'s DNAseq software, augmented by a proprietary small variant caller for difficult variants. Copy number variants (CNVs) are then called  using a proprietary bioinformatics pipeline based on depth analysis with a comparison to similarly sequenced samples. Analysis of the PMS2 gene is limited to exons 1-10. The interpretation and reporting of variants in APOB, PCSK9, and LDLR is specific to  familial hypercholesterolemia; variants associated with hypobetalipoproteinemia are not included. Interpretation is based upon guidelines published by the [employer] and [employer]), the Association for Mol ecular Pathology  (AMP) or their modification by [employer] when available and/or review of previous clinical assertions available in the [employer]. Interpretation is limited to the transcripts indicated on the report and +/- 10 bp into  intronic regions, except as noted below.  [facility] variant classifications include pathogenic, likely pathogenic, variant of uncertain significance (VUS), likely benign, and benign.  Only variants classified as pathogenic and likely pathogenic are included in  the report. All reported variants are confirmed through secondary manual inspection of DNA sequence data or orthogonal testing. Risk estimations and management guidelines included in this report are based on analysis of primary literature and  recommendations of applicable professional societies, and should be regarded as approximations.Based on validation studies, this assay delivers > 99% sensitivity and specificity for single nucleotide variants and insertions  and deletions (indels) up to  20 bp. Larger indels and complex variants are also reported but sensitivity may be reduced. Based on validation studies, this assay delivers > 99% sensitivity to multi-exon CNVs and > 90% sensitivity to single-exon CNVs. This test may not detect variants  in challenging regions (such as short tandem repeats, homopolymer runs, and segment duplications), sub-exonic CNVs, chromosomal aneuploidy, or variants in the presence of mosaicism. Phasing will be attempted and reported, when possible. Structural  rearrangements such as inversions, translocations, and gene conversions are not tested in this assay unless explicitly indicated. Additionally, deep intronic, promoter, and enhancer regions may not be covered. It is important to note that this is a  screening test and cannot detect all disease-causing variants. A negative result does not guarantee the absence of a rare, undetectable variant in the genes analyzed; consider using a diagnostic test if there i s significant personal and/or family history  of one of the conditions analyzed by this test. Any potential incidental findings outside of these genes and conditions will not be identified, nor reported. The results of a genetic test may be influenced by various  factors, including bone marrow  transplantation, blood transfusions, or in rare cases, hematolymphoid neoplasms.Gene Specific Notes:APOB: analysis is limited to c.10580G>A and c.10579C>T; BRCA1: sequencing analysis extends to CDS +/-20 bp; BRCA2: sequencing analysis extends to CDS  +/-20 bp. EPCAM: analysis is limited to CNVof exons 8-9; LDLR: analysis includes CNV ofthe promoter; MLH1: analysis includes CNV of the promoter; PMS2: analysis is limited to exons 1-10.Donnice JINNY Kemp, PhD, [email or web address]   RPR w/reflex to TrepSure     Status: None   Collection Time: 12/22/23 10:13 AM  Result Value Ref Range   RPR Non Reactive Non Reactive  Treponemal Antibodies, TPPA     Status: None   Collection Time: 12/22/23 10:13 AM  Result Value Ref Range   Treponemal Antibodies, TPPA Non Reactive Non Reactive   Interpretation: Comment     Comment: Syphilis: Treponemal Antibodies with Reflex to RPR and RPR           Titer, Reverse Screening and Diagnosis Algorithm ------------------------------------------------------------ Treponemal              Treponemal     Ab       RPR, Qn     Ab, TPPA    Final Interpretation ----------   --------   ----------   ----------------------- Non          N/A        N/A          No laboratory evidence Reactive                             of syphilis. Retest in                                      2-4 weeks if recent  exposure is suspected. ----------   --------   ----------   ----------------------- Reactive     Non        Non          Treponemal antibodies              Reactive   Reactive     not confirmed.                                      Inconclusive for                                      syphilis; potential                                      early syphilis,                                      possible false                                       positive. Retest in 2-4                                       weeks if recent                                      exposure is suspected. ----------   --------   ----------   ----------------------- Reactive     Non        Reactive     Treponemal antibodies              Reactive                detected. Consistent                                      with past or current                                      (potential early)                                      syphilis. ----------   --------   ----------   ----------------------- Reactive     >/=1:1     N/A          Treponemal and                                      nontreponemal  antibodies detected.                                      Consistent with current                                      or past syphilis. This test is intended ONLY for specimens that have tested positive (reactive) or equivocal for Treponema pallidum antibodies prior to submission for testing.  For the full CDC-recommended syphilis screening and diagnosis algorithm, [facility] offers test code 012005 RPR, Rfx Qn RPR/Confirm TP or 917654 T pallidum Screening Cascade.        Assessment & Plan:   Assessment & Plan Mixed hyperlipidemia Continue current therapy for lipid control. Will modify as needed based on labwork results.   GAD (generalized anxiety disorder) Patient stable.  Well controlled with current therapy.   Continue current meds.   Primary hypertension Blood pressure well controlled with current medications.  Continue current therapy.  Will reassess at follow up.      No follow-ups on file.   Total time spent: 20 minutes  ALAN CHRISTELLA ARRANT, FNP  11/02/2023   This document may have been prepared by [facility] Voice Recognition software and as such may include unintentional dictation errors.

## 2024-01-03 NOTE — Assessment & Plan Note (Signed)
 Blood pressure well controlled with current medications.  Continue current therapy.  Will reassess at follow up.

## 2024-01-03 NOTE — Assessment & Plan Note (Signed)
 Continue current therapy for lipid control. Will modify as needed based on labwork results.

## 2024-01-03 NOTE — Assessment & Plan Note (Signed)
 Patient stable.  Well controlled with current therapy.   Continue current meds.

## 2024-01-08 ENCOUNTER — Emergency Department

## 2024-01-08 ENCOUNTER — Other Ambulatory Visit: Payer: Self-pay

## 2024-01-08 ENCOUNTER — Observation Stay: Admission: EM | Admit: 2024-01-08 | Discharge: 2024-01-11 | Disposition: A | Discharge status: Home / Self Care

## 2024-01-08 DIAGNOSIS — R55 Syncope and collapse: Secondary | ICD-10-CM | POA: Diagnosis not present

## 2024-01-08 DIAGNOSIS — F411 Generalized anxiety disorder: Secondary | ICD-10-CM | POA: Diagnosis not present

## 2024-01-08 DIAGNOSIS — E785 Hyperlipidemia, unspecified: Secondary | ICD-10-CM | POA: Diagnosis not present

## 2024-01-08 DIAGNOSIS — Z7982 Long term (current) use of aspirin: Secondary | ICD-10-CM | POA: Diagnosis not present

## 2024-01-08 DIAGNOSIS — Z79899 Other long term (current) drug therapy: Secondary | ICD-10-CM | POA: Diagnosis not present

## 2024-01-08 DIAGNOSIS — Z6836 Body mass index (BMI) 36.0-36.9, adult: Secondary | ICD-10-CM | POA: Diagnosis not present

## 2024-01-08 DIAGNOSIS — Z96652 Presence of left artificial knee joint: Secondary | ICD-10-CM | POA: Insufficient documentation

## 2024-01-08 DIAGNOSIS — I10 Essential (primary) hypertension: Secondary | ICD-10-CM | POA: Diagnosis not present

## 2024-01-08 DIAGNOSIS — G4700 Insomnia, unspecified: Secondary | ICD-10-CM | POA: Diagnosis not present

## 2024-01-08 DIAGNOSIS — R29898 Other symptoms and signs involving the musculoskeletal system: Secondary | ICD-10-CM

## 2024-01-08 DIAGNOSIS — R079 Chest pain, unspecified: Secondary | ICD-10-CM | POA: Diagnosis not present

## 2024-01-08 DIAGNOSIS — G4733 Obstructive sleep apnea (adult) (pediatric): Secondary | ICD-10-CM | POA: Diagnosis not present

## 2024-01-08 DIAGNOSIS — R531 Weakness: Secondary | ICD-10-CM | POA: Diagnosis present

## 2024-01-08 DIAGNOSIS — N400 Enlarged prostate without lower urinary tract symptoms: Secondary | ICD-10-CM | POA: Diagnosis not present

## 2024-01-08 DIAGNOSIS — E66812 Obesity, class 2: Secondary | ICD-10-CM | POA: Insufficient documentation

## 2024-01-08 DIAGNOSIS — R0789 Other chest pain: Secondary | ICD-10-CM | POA: Diagnosis not present

## 2024-01-08 DIAGNOSIS — Z6835 Body mass index (BMI) 35.0-35.9, adult: Secondary | ICD-10-CM | POA: Insufficient documentation

## 2024-01-08 DIAGNOSIS — F32A Depression, unspecified: Secondary | ICD-10-CM | POA: Diagnosis present

## 2024-01-08 LAB — TROPONIN I (HIGH SENSITIVITY)
Troponin I (High Sensitivity): 3 ng/L (ref <18)
Troponin I (High Sensitivity): 5 ng/L (ref <18)

## 2024-01-08 LAB — COMPREHENSIVE METABOLIC PANEL WITH GFR
ALT: 23 U/L (ref 0–44)
AST: 28 U/L (ref 15–41)
Albumin: 4.1 g/dL (ref 3.5–5.0)
Alkaline Phosphatase: 88 U/L (ref 38–126)
Anion gap: 9 (ref 5–15)
BUN: 19 mg/dL (ref 8–23)
CO2: 25 mmol/L (ref 22–32)
Calcium: 9.2 mg/dL (ref 8.9–10.3)
Chloride: 102 mmol/L (ref 98–111)
Creatinine, Ser: 1.1 mg/dL (ref 0.61–1.24)
GFR, Estimated: >60 mL/min (ref >60)
Glucose, Bld: 104 mg/dL — ABNORMAL HIGH (ref 70–99)
Potassium: 4 mmol/L (ref 3.5–5.1)
Sodium: 136 mmol/L (ref 135–145)
Total Bilirubin: 0.5 mg/dL (ref 0.0–1.2)
Total Protein: 7.4 g/dL (ref 6.5–8.1)

## 2024-01-08 LAB — CBC
HCT: 43.5 % (ref 39.0–52.0)
Hemoglobin: 14.8 g/dL (ref 13.0–17.0)
MCH: 29.7 pg (ref 26.0–34.0)
MCHC: 34 g/dL (ref 30.0–36.0)
MCV: 87.2 fL (ref 80.0–100.0)
Platelets: 373 K/uL (ref 150–400)
RBC: 4.99 MIL/uL (ref 4.22–5.81)
RDW: 14.6 % (ref 11.5–15.5)
WBC: 6 K/uL (ref 4.0–10.5)
nRBC: 0 % (ref 0.0–0.2)

## 2024-01-08 MED ORDER — TRAZODONE HCL 50 MG PO TABS
50.0000 mg | ORAL_TABLET | Freq: Every evening | ORAL | Status: DC | PRN
  Administered 2024-01-08 – 2024-01-10 (×3): 50 mg via ORAL
  Filled 2024-01-08 (×3): qty 1

## 2024-01-08 MED ORDER — DULOXETINE HCL 30 MG PO CPEP
60.0000 mg | ORAL_CAPSULE | Freq: Every day | ORAL | Status: DC
Start: 2024-01-09 — End: 2024-01-11
  Administered 2024-01-09 – 2024-01-11 (×3): 60 mg via ORAL
  Filled 2024-01-08 (×3): qty 2

## 2024-01-08 MED ORDER — MELATONIN 5 MG PO TABS
5.0000 mg | ORAL_TABLET | Freq: Every evening | ORAL | Status: DC | PRN
  Administered 2024-01-09 – 2024-01-10 (×2): 5 mg via ORAL
  Filled 2024-01-08 (×2): qty 1

## 2024-01-08 MED ORDER — ONDANSETRON HCL 4 MG/2ML IJ SOLN
4.0000 mg | Freq: Four times a day (QID) | INTRAMUSCULAR | Status: DC | PRN
Start: 2024-01-08 — End: 2024-01-13

## 2024-01-08 MED ORDER — AMLODIPINE BESYLATE 5 MG PO TABS
5.0000 mg | ORAL_TABLET | Freq: Every day | ORAL | Status: DC
  Administered 2024-01-09 – 2024-01-11 (×3): 5 mg via ORAL
  Filled 2024-01-08 (×3): qty 1

## 2024-01-08 MED ORDER — SODIUM CHLORIDE 0.9% FLUSH
3.0000 mL | Freq: Two times a day (BID) | INTRAVENOUS | Status: DC
  Administered 2024-01-10: 3 mL via INTRAVENOUS

## 2024-01-08 MED ORDER — BENAZEPRIL HCL 20 MG PO TABS
40.0000 mg | ORAL_TABLET | Freq: Every day | ORAL | Status: DC
  Administered 2024-01-09 – 2024-01-11 (×3): 40 mg via ORAL
  Filled 2024-01-08 (×3): qty 2

## 2024-01-08 MED ORDER — ENOXAPARIN SODIUM 60 MG/0.6ML IJ SOSY: 45.0000 mg | PREFILLED_SYRINGE | INTRAMUSCULAR | Status: DC

## 2024-01-08 MED ORDER — ONDANSETRON HCL 4 MG PO TABS
4.0000 mg | ORAL_TABLET | Freq: Four times a day (QID) | ORAL | Status: DC | PRN
Start: 2024-01-08 — End: 2024-01-13

## 2024-01-08 MED ORDER — ACETAMINOPHEN 650 MG RE SUPP: 650.0000 mg | Freq: Four times a day (QID) | RECTAL | Status: DC | PRN

## 2024-01-08 MED ORDER — SENNOSIDES-DOCUSATE SODIUM 8.6-50 MG PO TABS: 1.0000 | ORAL_TABLET | Freq: Every evening | ORAL | Status: DC | PRN

## 2024-01-08 MED ORDER — ACETAMINOPHEN 325 MG PO TABS
650.0000 mg | ORAL_TABLET | Freq: Four times a day (QID) | ORAL | Status: DC | PRN
Start: 2024-01-08 — End: 2024-01-13
  Administered 2024-01-09: 650 mg via ORAL
  Filled 2024-01-08: qty 2

## 2024-01-08 MED ORDER — GABAPENTIN 400 MG PO CAPS
800.0000 mg | ORAL_CAPSULE | Freq: Two times a day (BID) | ORAL | Status: DC
  Administered 2024-01-08 – 2024-01-11 (×6): 800 mg via ORAL
  Filled 2024-01-08: qty 8
  Filled 2024-01-08 (×3): qty 2
  Filled 2024-01-08: qty 8
  Filled 2024-01-08 (×3): qty 2

## 2024-01-08 MED ORDER — HYDRALAZINE HCL 20 MG/ML IJ SOLN: 5.0000 mg | Freq: Four times a day (QID) | INTRAMUSCULAR | Status: DC | PRN

## 2024-01-08 MED ORDER — ASPIRIN 81 MG PO TBEC
81.0000 mg | DELAYED_RELEASE_TABLET | Freq: Every day | ORAL | Status: DC
  Administered 2024-01-09 – 2024-01-11 (×3): 81 mg via ORAL
  Filled 2024-01-08 (×3): qty 1

## 2024-01-08 NOTE — Assessment & Plan Note (Signed)
 Home duloxetine  60 mg daily resumed Trazodone  50 mg as needed for insomnia resumed

## 2024-01-08 NOTE — ED Triage Notes (Signed)
 First nurse note: Pt reports woke up and had AMS and weakness. Pt was seen by EMS after episode and told pt to go to neuro appointment. Pt went to [facility] [address]. KC reports word salad.

## 2024-01-08 NOTE — Assessment & Plan Note (Signed)
 CPAP nightly ordered

## 2024-01-08 NOTE — Hospital Course (Signed)
 Mr. Azad Calame is a 73 year old male with history of hypertension, hyperlipidemia, GERD, history of DVT, who presents ED for chief concerns of weakness, near syncope and chest pain for about 1 week.  Vitals in the ED showed t of 98.1, rr 18, heart rate 74, blood pressure 131/95, SpO2 of 99% on room air.  Serum sodium is 136, potassium 4.0, chloride 102, bicarb 25, BUN of 19, serum creatinine 1.10, eGFR 80 treatment:> 60, WBC 6.0, hemoglobin 14.8, platelets of 373.  ED treatment: None

## 2024-01-08 NOTE — Assessment & Plan Note (Addendum)
 Currently controlled Home amlodipine  5-benazepril  40 mg daily combination resumed Hydralazine  5 mg IV every 6 hours as needed for SBP greater 175, 5 days ordered

## 2024-01-08 NOTE — Assessment & Plan Note (Addendum)
 Melatonin 5 mg nightly as needed for sleep Home trazodone  50 mg nightly as needed for insomnia not responding to melatonin or patient refuses melatonin

## 2024-01-08 NOTE — Assessment & Plan Note (Deleted)
 Fall precaution, complete echo Orthostatic vital sign MRI of the brain without contrast Continue telemetry

## 2024-01-08 NOTE — H&P (Addendum)
 History and Physical   Vincent Cole FMW:[id number] DOB: 12/06/1950 DOA: 01/08/2024  PCP: Fernand Fredy RAMAN, MD Patient coming from: [facility]  I have personally briefly reviewed patient's old medical records in [facility] EMR.  Chief Concern: syncope  HPI: [name] is a 73 year old male with history of hypertension, hyperlipidemia, GERD, history of DVT, who presents ED for chief concerns of weakness, near syncope and chest pain for about 1 week.  Vitals in the ED showed t of 98.1, rr 18, heart rate 74, blood pressure 131/95, SpO2 of 99% on room air.  Serum sodium is 136, potassium 4.0, chloride 102, bicarb 25, BUN of 19, serum creatinine 1.10, eGFR 80 treatment:> 60, WBC 6.0, hemoglobin 14.8, platelets of 373.  ED treatment: None ------------------------------------- At bedside, patient was able to tell me his first and last name, age, location, current calendar year.  He reports that his symptoms started on Friday.  01/05/2024, he was on his way walking to his car when he noticed his left knee felt cold and the left leg completely gave out on him.  He did not hit his head.  He was able to grab onto something and he reports he did not lose consciousness.  He reports that yesterday he was at [employer] with his wife, he was driving the shopping cart in the store when he again noticed his left knee feeling cold and weak.  He reports this sensation then moved to his chest.  He suspected that his left leg was getting give out so he held onto the shopping cart.  The next thing he remember he was laying on the floor.  He endorsed losing consciousness at this time.  He does not know how long he was out.  He denies urinary and bowel incontinence.  His wife was not there and there was no report of abnormal bodily movements.  He denies further trauma to his person.  He reports he is never felt this way before.  At bedside, he denies chest pain, abdominal pain, dysuria,  hematuria, diarrhea, blood in his stool.  He denies fever, cough, chills.    Patient further reports over the last week, his blood pressure has not been well-controlled.  He reports that he has been compliant with his home antihypertensive medication and that his blood pressure, systolically has been in the 140s, diastolic less than 90 previously.  Over the last week, in the evening he noticed that his blood pressure systolically has been in the 160s and diastolically has been in the 90s.  Social history: He lives at home with his wife.  He denies tobacco, EtOH, recreational drug use.  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  ROS: Constitutional: no weight change, no fever ENT/Mouth: no sore throat, no rhinorrhea Eyes: no eye pain, no vision changes Cardiovascular: no chest pain, no dyspnea,  no edema, no palpitations Respiratory: no cough, no sputum, no wheezing Gastrointestinal: no nausea, no vomiting, no diarrhea, no constipation Genitourinary: no urinary incontinence, no dysuria, no hematuria Musculoskeletal: no arthralgias, no myalgias Skin: no skin lesions, no pruritus, Neuro: + weakness, + loss of consciousness, + syncope Psych: no anxiety, no depression, + decrease appetite Heme/Lymph: no bruising, no bleeding  ED Course: Discussed with EDP, patient requiring hospitalization for chief concerns of syncope.  Assessment/Plan  Principal Problem:   Syncope and collapse Active Problems:   Chest discomfort   OSA (obstructive sleep apnea)   Hyperlipidemia   Morbid obesity (HCC)  Benign prostatic hyperplasia   Insomnia   GAD (generalized anxiety disorder)   HTN (hypertension)   Depression   Polypharmacy   Assessment and Plan:  * Syncope and collapse Chest x-ray was negative in the ED, EKG noted for Q wave changes in the inferior leads which is new when comparing previous 2 EKGs Fall precaution, complete echo Orthostatic vital sign MRI of the brain without  contrast Continue telemetry  Chest discomfort Etiology unclear at this time Initial high sensitive troponin was negative Will recheck a second high sensitive troponin And in setting of EKG showing new Q wave changes in the inferior leads when compared to prior EKG, I have ordered a complete echo AM team to consult cardiology pending complete echo/as appropriate  OSA (obstructive sleep apnea) CPAP nightly ordered  Morbid obesity (HCC) This complicates overall care and prognosis.   Polypharmacy PDMP reviewed Home gabapentin  800 mg p.o. twice daily resumed  Depression Home duloxetine  60 mg daily resumed Trazodone  50 mg as needed for insomnia resumed  HTN (hypertension) Currently controlled Home amlodipine  5-benazepril  40 mg daily combination resumed Hydralazine  5 mg IV every 6 hours as needed for SBP greater 175, 5 days ordered  Insomnia Melatonin 5 mg nightly as needed for sleep Home trazodone  50 mg nightly as needed for insomnia not responding to melatonin or patient refuses melatonin  Chart reviewed.   DVT prophylaxis: Pharmacologic DVT not initiated on admission.  AM team to initiate pharmacologic DVT when the benefits outweigh the risk. Code Status: Full code Diet: Heart healthy Family Communication: A phone call was offered, patient declined stating that his wife already knows he is being admitted to the hospital and the concerns Disposition Plan: Pending clinical course Consults called: None at this time Admission status: Telemetry medical, inpatient  Past Medical History:  Diagnosis Date   Abdominal pain 01/17/2011   Formatting of this note might be different from the original.  Note: Unchanged - chronic  suprapubic/lower abdominal area since TURP in 2005; urology has now diagnosed, after extensive evaluation, interstitial cystitis; has also had repeat TURP in June 2010, and a surgery on the neck of the bladder in July of 2011     Actinic keratosis    Arthritis     Benign bladder mass    Complete tear of left rotator cuff 04/19/2017   Complication of anesthesia    PONV x 1 in 2010   Deep venous thrombosis (HCC) 12/30/2023   Depression    Frequency of urination    GERD (gastroesophageal reflux disease) no meds   Heart murmur    was told many years ago   History of DVT of lower extremity 02/03/2020   History of recurrent UTIs    Hypertension    IC (interstitial cystitis)    Nocturia    OSA on CPAP nightly   Pelvic pain in male    PONV (postoperative nausea and vomiting)    Simple renal cyst    Urgency of urination    Urinary hesitancy    Past Surgical History:  Procedure Laterality Date   BUNIONECTOMY Right 01/15/2021   Procedure: ZELL HAI;  Surgeon: Ashley Soulier, DPM;  Location: [facility];  Service: Podiatry;  Laterality: Right;   COLONOSCOPY     COLONOSCOPY WITH PROPOFOL  N/A 06/25/2021   Procedure: COLONOSCOPY WITH PROPOFOL ;  Surgeon: Maryruth Ole DASEN, MD;  Location: [facility];  Service: Endoscopy;  Laterality: N/A;   CORRECTION OVERLAPPING TOES Right 01/15/2021   Procedure: CORRECTION OVERLAPPING TOES;  Surgeon:  Ashley Soulier, DPM;  Location: [facility];  Service: Podiatry;  Laterality: Right;   CYSTO WITH HYDRODISTENSION  06/02/2011   Procedure: CYSTOSCOPY/HYDRODISTENSION;  Surgeon: Glendia DELENA Elizabeth, MD;  Location: [facility];  Service: Urology;  Laterality: N/A;  catheter placement   HAMMER TOE SURGERY Right 01/15/2021   Procedure: HAMMER TOE CORRECTION;  Surgeon: Ashley Soulier, DPM;  Location: [facility];  Service: Podiatry;  Laterality: Right;   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 He has never used smokeless tobacco. He reports current alcohol use. He reports that he does not use drugs.  No Known Allergies Family History  Problem Relation Age of Onset   Heart disease Mother    Family history: Family history reviewed and not pertinent  Prior to Admission medications   Medication Sig Start Date End Date Taking? Authorizing Provider  amLODipine -benazepril  (LOTREL) 5-40 MG capsule TAKE 1 CAPSULE BY MOUTH EVERY DAY 09/14/23   Fernand Fredy RAMAN, MD  aspirin  EC 81 MG tablet Take 81 mg by mouth daily. Swallow whole.    [provider]  Azelastine HCl 137 MCG/SPRAY SOLN USE 2 SPRAYS IN EACH NOSTRIL AT NIGHT Patient not taking: Reported on 11/02/2023    [provider]  busPIRone  (BUSPAR ) 10 MG tablet TAKE 1 TABLET BY MOUTH THREE TIMES A DAY AS NEEDED 11/28/23   Orlean Alan HERO, FNP  DULoxetine  (CYMBALTA ) 60 MG capsule Take 1 capsule (60 mg total) by mouth daily. 07/17/23   Orlean Alan HERO, FNP  fluticasone (FLONASE) 50 MCG/ACT nasal spray Place 1-2 sprays into both nostrils at bedtime. 12/28/20   [provider]  gabapentin  (NEURONTIN ) 300 MG capsule TAKE 1 CAPSULE BY MOUTH THREE TIMES A DAY Patient not taking: Reported on 11/02/2023 09/05/22   Fernand Fredy RAMAN, MD  gabapentin  (NEURONTIN ) 800 MG tablet Take 800 mg by mouth 2 (two) times daily. 10/20/23   [provider]  HYDROcodone -acetaminophen  (NORCO) 7.5-325 MG tablet Take 1 tablet by mouth every 4 (four) hours as needed. 09/28/23   [provider]  hydrocortisone  2.5 % cream Apply topically to aa of face t- thur- sat nightly 12/06/23   Hester Alm BROCKS, MD  ketoconazole  (NIZORAL ) 2 % cream Apply to affected areas on face at bedtime on M W Fri 12/06/23   Hester Alm BROCKS, MD  meloxicam  (MOBIC ) 15 MG tablet Take 15 mg by mouth in the morning. 08/31/17   [provider]  mupirocin  ointment (BACTROBAN ) 2 % Apply 1 Application topically daily. Patient not taking:  Reported on 11/02/2023 09/13/23   Claudene Lehmann, MD  Omega-3 Fatty Acids (OMEGA 3 PO) Take 1 capsule by mouth once a week.    [provider]  torsemide  (DEMADEX ) 10 MG tablet TAKE 1 TABLET BY MOUTH EVERY DAY 12/18/23   Orlean Alan HERO, FNP  traZODone  (DESYREL ) 50 MG tablet Take 50 mg by mouth at bedtime as needed.    [provider]   Physical Exam: Vitals:   01/08/24 1446 01/08/24 1447 01/08/24 1830 01/08/24 1850  BP: 102/81  (!) 131/95   Pulse: 95  73  Resp: 18  18   Temp: 98 F (36.7 C)   98.1 F (36.7 C)  TempSrc: Oral   Oral  SpO2: 95%  99%   Weight:  93.9 kg    Height:  5' 4 (1.626 m)     Constitutional: appears age-appropriate, weak, calm Eyes: PERRL, lids and conjunctivae normal ENMT: Mucous membranes are moist. Posterior pharynx clear of any exudate or lesions. Age-appropriate dentition. Hearing appropriate Neck: normal, supple, no masses, no thyromegaly Respiratory: clear to auscultation bilaterally, no wheezing, no crackles. Normal respiratory effort. No accessory muscle use.  Cardiovascular: Regular rate and rhythm, no murmurs / rubs / gallops.  Trace right lower extremity edema. 2+ pedal pulses. No carotid bruits.  Abdomen: Obese abdomen, no tenderness, no masses palpated, no hepatosplenomegaly. Bowel sounds positive.  Musculoskeletal: no clubbing / cyanosis. No joint deformity upper and lower extremities. Good ROM, no contractures, no atrophy. Normal muscle tone.  Skin: no rashes, lesions, ulcers. No induration Neurologic: Sensation intact. Strength 5/5 in all 4.  Psychiatric: Normal judgment and insight. Alert and oriented x 3. Normal mood.   EKG: independently reviewed, showing sinus rhythm with rate of 94, QTc 425  Chest x-ray on Admission: I personally reviewed and I agree with radiologist reading as below.  CT Head Wo Contrast Result Date: 01/08/2024 CLINICAL DATA:  Neuro deficit, acute, stroke suspected; Neck trauma (Age >= 65y) EXAM:  CT HEAD WITHOUT CONTRAST CT CERVICAL SPINE WITHOUT CONTRAST TECHNIQUE: Multidetector CT imaging of the head and cervical spine was performed following the standard protocol without intravenous contrast. Multiplanar CT image reconstructions of the cervical spine were also generated. RADIATION DOSE REDUCTION: This exam was performed according to the departmental dose-optimization program which includes automated exposure control, adjustment of the mA and/or kV according to patient size and/or use of iterative reconstruction technique. COMPARISON:  None Available. FINDINGS: CT HEAD FINDINGS Brain: No evidence of acute infarction, hemorrhage, hydrocephalus, extra-axial collection or mass lesion/mass effect. Cerebral atrophy. Vascular: No hyperdense vessel. Skull: No acute fracture. Sinuses/Orbits: Clear sinuses.  No acute orbital findings. Other: No mastoid effusions. CT CERVICAL SPINE FINDINGS Alignment: Mild facet mediated, degenerative anterolisthesis of C3 on C4 and C4 on C5. Otherwise, no substantial sagittal subluxation. Skull base and vertebrae: No evidence of acute fracture. Soft tissues and spinal canal: No prevertebral fluid or swelling. No visible canal hematoma. Disc levels: Multilevel facet arthropathy, greatest and severe on the right at C4-C5 and the left at C3-C4. When combined with uncovertebral hypertrophy there is resulting varying degrees of neural foraminal stenosis. Upper chest: Lung apices are clear. IMPRESSION: 1. No evidence of acute intracranial abnormality. No evidence of acute fracture or traumatic malalignment in the cervical spine. 2. Multilevel degenerative change, including severe facet arthropathy on the left at C3-C4 and right at C4-C5. An MRI could better assess the canal and foramina if clinically warranted. Electronically Signed   By: Gilmore GORMAN Molt M.D.   On: 01/08/2024 18:01   CT Cervical Spine Wo Contrast Result Date: 01/08/2024 CLINICAL DATA:  Neuro deficit, acute, stroke  suspected; Neck trauma (Age >= 65y) EXAM: CT HEAD WITHOUT CONTRAST CT CERVICAL SPINE WITHOUT CONTRAST TECHNIQUE: Multidetector CT imaging of the head and cervical spine was performed following the standard protocol without intravenous contrast. Multiplanar CT image reconstructions of the cervical spine were also generated. RADIATION DOSE REDUCTION: This exam was performed according to the departmental dose-optimization program which includes automated exposure control, adjustment of the mA and/or kV according to patient size and/or use of iterative  reconstruction technique. COMPARISON:  None Available. FINDINGS: CT HEAD FINDINGS Brain: No evidence of acute infarction, hemorrhage, hydrocephalus, extra-axial collection or mass lesion/mass effect. Cerebral atrophy. Vascular: No hyperdense vessel. Skull: No acute fracture. Sinuses/Orbits: Clear sinuses.  No acute orbital findings. Other: No mastoid effusions. CT CERVICAL SPINE FINDINGS Alignment: Mild facet mediated, degenerative anterolisthesis of C3 on C4 and C4 on C5. Otherwise, no substantial sagittal subluxation. Skull base and vertebrae: No evidence of acute fracture. Soft tissues and spinal canal: No prevertebral fluid or swelling. No visible canal hematoma. Disc levels: Multilevel facet arthropathy, greatest and severe on the right at C4-C5 and the left at C3-C4. When combined with uncovertebral hypertrophy there is resulting varying degrees of neural foraminal stenosis. Upper chest: Lung apices are clear. IMPRESSION: 1. No evidence of acute intracranial abnormality. No evidence of acute fracture or traumatic malalignment in the cervical spine. 2. Multilevel degenerative change, including severe facet arthropathy on the left at C3-C4 and right at C4-C5. An MRI could better assess the canal and foramina if clinically warranted. Electronically Signed   By: Gilmore GORMAN Molt M.D.   On: 01/08/2024 18:01   DG Chest 2 View Result Date: 01/08/2024 CLINICAL DATA:   Chest pain, progressive weakness for 1 week, dizziness EXAM: CHEST - 2 VIEW COMPARISON:  11/24/2019 FINDINGS: Frontal and lateral views of the chest demonstrate a stable cardiac silhouette. No acute airspace disease, effusion, or pneumothorax. No acute bony abnormalities. Unremarkable bilateral shoulder arthroplasties. IMPRESSION: 1. No acute intrathoracic process. Electronically Signed   By: Ozell Daring M.D.   On: 01/08/2024 17:53   Labs on Admission: I have personally reviewed following labs  CBC: Recent Labs  Lab 01/08/24 1450  WBC 6.0  HGB 14.8  HCT 43.5  MCV 87.2  PLT 373   Basic Metabolic Panel: Recent Labs  Lab 01/08/24 1450  NA 136  K 4.0  CL 102  CO2 25  GLUCOSE 104*  BUN 19  CREATININE 1.10  CALCIUM  9.2   GFR: Estimated Creatinine Clearance: 60.9 mL/min (by C-G formula based on SCr of 1.1 mg/dL).  Liver Function Tests: Recent Labs  Lab 01/08/24 1450  AST 28  ALT 23  ALKPHOS 88  BILITOT 0.5  PROT 7.4  ALBUMIN 4.1   Urine analysis:    Component Value Date/Time   COLORURINE AMBER (A) 05/17/2023 0854   APPEARANCEUR CLOUDY (A) 05/17/2023 0854   APPEARANCEUR Clear 12/26/2016 0941   LABSPEC 1.032 (H) 05/17/2023 0854   PHURINE 5.0 05/17/2023 0854   GLUCOSEU NEGATIVE 05/17/2023 0854   HGBUR NEGATIVE 05/17/2023 0854   BILIRUBINUR neg 06/05/2023 1111   BILIRUBINUR Negative 12/26/2016 0941   KETONESUR NEGATIVE 05/17/2023 0854   PROTEINUR Negative 06/05/2023 1111   PROTEINUR 100 (A) 05/17/2023 0854   UROBILINOGEN 0.2 06/05/2023 1111   NITRITE neg 06/05/2023 1111   NITRITE NEGATIVE 05/17/2023 0854   LEUKOCYTESUR Negative 06/05/2023 1111   LEUKOCYTESUR LARGE (A) 05/17/2023 0854   This document was prepared using Dragon Voice Recognition software and may include unintentional dictation errors.  Dr. Sherre [facility]  If 7PM-7AM, please contact overnight-coverage provider If 7AM-7PM, please contact day attending  provider [email or web address]  01/08/2024, 8:08 PM

## 2024-01-08 NOTE — Assessment & Plan Note (Signed)
 Etiology unclear at this time Initial high sensitive troponin was negative Will recheck a second high sensitive troponin And in setting of EKG showing new Q wave changes in the inferior leads when compared to prior EKG, I have ordered a complete echo AM team to consult cardiology pending complete echo/as appropriate

## 2024-01-08 NOTE — Assessment & Plan Note (Signed)
 PDMP reviewed Home gabapentin  800 mg p.o. twice daily resumed

## 2024-01-08 NOTE — ED Triage Notes (Signed)
 Pt sts that he has been having weakness for the last week that is progressivly getting worse. PT sts that he was at the store yesterday and started to feel very weak and thought he could just rest and hold onto the shopping cart however pt sts that he woke up on the floor. Pt sts that he was also sitting in his chair this AM and had the same episode happen again however pt did not fall this time. Pt is A/Ox4, speech is clear.

## 2024-01-08 NOTE — Assessment & Plan Note (Addendum)
 Chest x-ray was negative in the ED, EKG noted for Q wave changes in the inferior leads which is new when comparing previous 2 EKGs Fall precaution, complete echo Orthostatic vital sign MRI of the brain without contrast Continue telemetry

## 2024-01-08 NOTE — Assessment & Plan Note (Signed)
 -  This complicates overall care and prognosis.

## 2024-01-08 NOTE — ED Provider Notes (Signed)
 [facility] Provider Note    Event Date/Time   First MD Initiated Contact with Patient 01/08/24 1638     (approximate)   History   Chief Complaint Weakness   HPI  Vincent Cole is a 73 y.o. male with past medical history of hypertension, hyperlipidemia, GERD, and DVT who presents to the ED complaining of weakness.  Patient reports that over the past 3 days he has having intermittent episodes of dizziness and lightheadedness where he will also feel weakness in his left leg.  He states he feels like he is going to pass out and lost consciousness yesterday as well as today when this happened.  He describes a cool sensation that starts in his leg and moves up into his chest, but has not had any chest discomfort or difficulty breathing outside of these episodes.  He denies any history of similar episodes, denies any cardiac history.  He has not had any fevers, cough, nausea, vomiting, diarrhea, or dysuria.  Here in the ED he is not currently having any numbness or weakness, does report some headache after hitting his head with a fall yesterday.     Physical Exam   Triage Vital Signs: ED Triage Vitals  Encounter Vitals Group     BP 01/08/24 1446 102/81     Girls Systolic BP Percentile --      Girls Diastolic BP Percentile --      Boys Systolic BP Percentile --      Boys Diastolic BP Percentile --      Pulse Rate 01/08/24 1446 95     Resp 01/08/24 1446 18     Temp 01/08/24 1446 98 F (36.7 C)     Temp Source 01/08/24 1446 Oral     SpO2 01/08/24 1446 95 %     Weight 01/08/24 1447 207 lb (93.9 kg)     Height 01/08/24 1447 5' 4 (1.626 m)     Head Circumference --      Peak Flow --      Pain Score 01/08/24 1446 0     Pain Loc --      Pain Education --      Exclude from Growth Chart --     Most recent vital signs: Vitals:   01/08/24 1830 01/08/24 1850  BP: (!) 131/95   Pulse: 73   Resp: 18   Temp:  98.1 F (36.7 C)  SpO2: 99%      Constitutional: Alert and oriented. Eyes: Conjunctivae are normal. Head: Atraumatic. Nose: No congestion/rhinnorhea. Mouth/Throat: Mucous membranes are moist.  Neck: No midline cervical spine tenderness to palpation. Cardiovascular: Normal rate, regular rhythm. Grossly normal heart sounds.  2+ radial pulses bilaterally. Respiratory: Normal respiratory effort.  No retractions. Lungs CTAB. Gastrointestinal: Soft and nontender. No distention. Musculoskeletal: No lower extremity tenderness nor edema.  Neurologic:  Normal speech and language. No gross focal neurologic deficits are appreciated.    ED Results / Procedures / Treatments   Labs (all labs ordered are listed, but only abnormal results are displayed) Labs Reviewed  COMPREHENSIVE METABOLIC PANEL WITH GFR - Abnormal; Notable for the following components:      Result Value   Glucose, Bld 104 (*)    All other components within normal limits  CBC  URINALYSIS, ROUTINE W REFLEX MICROSCOPIC  BASIC METABOLIC PANEL WITH GFR  CBC  CBG MONITORING, ED  TROPONIN I (HIGH SENSITIVITY)     EKG  ED ECG REPORT I, Carlin Palin, the attending  physician, personally viewed and interpreted this ECG.   Date: 01/08/2024  EKG Time: 14:53  Rate: 94  Rhythm: normal sinus rhythm  Axis: Normal  Intervals:none  ST&T Change: Inferior Q waves  RADIOLOGY CT head reviewed and interpreted by me with no hemorrhage or midline shift.  PROCEDURES:  Critical Care performed: No  Procedures   MEDICATIONS ORDERED IN ED: Medications  sodium chloride  flush (NS) 0.9 % injection 3 mL (has no administration in time range)  enoxaparin  (LOVENOX ) injection 45 mg (has no administration in time range)  senna-docusate (Senokot-S) tablet 1 tablet (has no administration in time range)  acetaminophen  (TYLENOL ) tablet 650 mg (has no administration in time range)    Or  acetaminophen  (TYLENOL ) suppository 650 mg (has no administration in time range)   ondansetron  (ZOFRAN ) tablet 4 mg (has no administration in time range)    Or  ondansetron  (ZOFRAN ) injection 4 mg (has no administration in time range)  hydrALAZINE  (APRESOLINE ) injection 5 mg (has no administration in time range)  melatonin tablet 5 mg (has no administration in time range)     IMPRESSION / MDM / ASSESSMENT AND PLAN / ED COURSE  I reviewed the triage vital signs and the nursing notes.                              73 y.o. male with past medical history of hypertension, hyperlipidemia, GERD, and DVT who presents to the ED with intermittent episodes of lightheadedness, leg weakness, cool sensation in his chest, and syncope over the past 3 days.  Patient's presentation is most consistent with acute presentation with potential threat to life or bodily function.  Differential diagnosis includes, but is not limited to, arrhythmia, ACS, PE, anemia, electrolyte abnormality, AKI, vasovagal episode, orthostatic hypotension, seizure, TIA, stroke.  Patient nontoxic-appearing and in no acute distress, vital signs are unremarkable.  EKG shows normal sinus rhythm along with new Q waves inferiorly.  Low suspicion for PE with no active chest pain or shortness of breath, but will add on troponin and observe on cardiac monitor.  Labs thus far without significant anemia, leukocytosis, electrolyte abnormality, or AKI.  He has no focal neurologic deficits at this time and I doubt stroke but would consider TIA given intermittent left leg weakness.  We will check CT head and cervical spine given fall.  CT head and cervical spine are negative for acute process, troponin within normal limits.  Case discussed with hospitalist for admission for further syncope workup.      FINAL CLINICAL IMPRESSION(S) / ED DIAGNOSES   Final diagnoses:  Syncope, unspecified syncope type  Left leg weakness     Rx / DC Orders   ED Discharge Orders     None        Note:  This document was prepared using  Dragon voice recognition software and may include unintentional dictation errors.   Willo Dunnings, MD 01/08/24 [phone]

## 2024-01-09 ENCOUNTER — Inpatient Hospital Stay

## 2024-01-09 ENCOUNTER — Inpatient Hospital Stay: Admit: 2024-01-09 | Discharge: 2024-01-09 | Disposition: A | Discharge status: Home / Self Care

## 2024-01-09 DIAGNOSIS — R55 Syncope and collapse: Secondary | ICD-10-CM | POA: Diagnosis not present

## 2024-01-09 LAB — URINALYSIS, ROUTINE W REFLEX MICROSCOPIC
Bilirubin Urine: NEGATIVE
Glucose, UA: NEGATIVE mg/dL
Hgb urine dipstick: NEGATIVE
Ketones, ur: NEGATIVE mg/dL
Leukocytes,Ua: NEGATIVE
Nitrite: NEGATIVE
Protein, ur: NEGATIVE mg/dL
Specific Gravity, Urine: 1.011 (ref 1.005–1.030)
pH: 7 (ref 5.0–8.0)

## 2024-01-09 LAB — BASIC METABOLIC PANEL WITH GFR
Anion gap: 7 (ref 5–15)
BUN: 22 mg/dL (ref 8–23)
CO2: 25 mmol/L (ref 22–32)
Calcium: 8.9 mg/dL (ref 8.9–10.3)
Chloride: 102 mmol/L (ref 98–111)
Creatinine, Ser: 0.9 mg/dL (ref 0.61–1.24)
GFR, Estimated: >60 mL/min (ref >60)
Glucose, Bld: 91 mg/dL (ref 70–99)
Potassium: 3.9 mmol/L (ref 3.5–5.1)
Sodium: 134 mmol/L — ABNORMAL LOW (ref 135–145)

## 2024-01-09 LAB — CBC
HCT: 41.1 % (ref 39.0–52.0)
Hemoglobin: 13.5 g/dL (ref 13.0–17.0)
MCH: 29.2 pg (ref 26.0–34.0)
MCHC: 32.8 g/dL (ref 30.0–36.0)
MCV: 88.8 fL (ref 80.0–100.0)
Platelets: 299 K/uL (ref 150–400)
RBC: 4.63 MIL/uL (ref 4.22–5.81)
RDW: 14.4 % (ref 11.5–15.5)
WBC: 5.5 K/uL (ref 4.0–10.5)
nRBC: 0 % (ref 0.0–0.2)

## 2024-01-09 LAB — GLUCOSE, CAPILLARY: Glucose-Capillary: 98 mg/dL (ref 70–99)

## 2024-01-09 NOTE — Plan of Care (Signed)
  Problem: Education: Goal: Knowledge of condition and prescribed therapy will improve Outcome: Progressing   Problem: Education: Goal: Knowledge of General Education information will improve Description: Including pain rating scale, medication(s)/side effects and non-pharmacologic comfort measures Outcome: Progressing   Problem: Activity: Goal: Risk for activity intolerance will decrease Outcome: Progressing   Problem: Coping: Goal: Level of anxiety will decrease Outcome: Progressing

## 2024-01-09 NOTE — Consult Note (Signed)
 [facility] CLINIC CARDIOLOGY CONSULT NOTE       Patient ID: Vincent Cole MRN: [id number] DOB/AGE: [id number] y.o.  Admit date: 01/08/2024 Referring Physician Dr. Jens Primary Physician Fernand Fredy RAMAN, MD Primary Cardiologist None Reason for Consultation recurrent syncope  HPI: Vincent Cole is a 73 y.o. male  with a past medical history of hypertension, hyperlipidemia, history of DVT who presented to the ED on 01/08/2024 for left leg weakness associated with left leg pain that leads to a fall and LOC x1 more recently. Patient reports left sided facial weakness and left eyelid heaviness associated with event. Patient states he saw a neurologist outpatient yesterday and provider referred patient to ED. Patient denies hx of CAD, MI, HF or AF/AFL. Cardiology was consulted for further evaluation.   Work up in the ED notable for sodium 136, potassium 4.0, creatinine 1.1, hemoglobin 14.8, platelets 373.  LFTs within normal limits.  EKG with Sinus rhythm, rate 94 bpm with nonspecific ST-T wave changes, Q waves noted in inferior and lateral leads.  Troponins negative x 2.  Lipid panel from 05/2023 within normal limits, LDL 82.  CXR without acute cardiopulmonary disease.  MRI brain with no evidence of acute intracranial abnormalities.   At the time of my evaluation this afternoon, patient was seen walking around his room with a walker doing doing leg exercises. Patient states he doesn't like laying in bed and likes to keep moving.  We discussed patient's symptoms in further detail. Patient states for the past few weeks he has had 4 episodes of bilateral leg weakness that leads to a fall. Patient states he feels weak in his knees, especially his left knee that then leads to left knee pain and a cool feeling up his leg, then he falls. Patient states this has happened multiple times and most recently he lost consciousness. Patient's wife witness the event and states when he LOC his eyes dilated and moves  side to side. Patient states he also noticed left sided weakness of his face and left eyelid during his most recent event as well. Patient states these events never occur at rest, only when he's walking. Patient denies any chest pain/palpations, palpitations, SOB, lightheadedness or dizziness. Patient denies any hx of CAD, MI, HF or AF/AFL. BP and HR stable. Per tele no significant events notes, no evidence of significant pauses, bradycardia or high grade AVB.    Review of systems complete and found to be negative unless listed above    Past Medical History:  Diagnosis Date   Abdominal pain 01/17/2011   Formatting of this note might be different from the original.  Note: Unchanged - chronic  suprapubic/lower abdominal area since TURP in 2005; urology has now diagnosed, after extensive evaluation, interstitial cystitis; has also had repeat TURP in June 2010, and a surgery on the neck of the bladder in July of 2011     Actinic keratosis    Arthritis    Benign bladder mass    Complete tear of left rotator cuff 04/19/2017   Complication of anesthesia    PONV x 1 in 2010   Deep venous thrombosis (HCC) 12/30/2023   Depression    Frequency of urination    GERD (gastroesophageal reflux disease) no meds   Heart murmur    was told many years ago   History of DVT of lower extremity 02/03/2020   History of recurrent UTIs    Hypertension    IC (interstitial cystitis)    Nocturia  OSA on CPAP nightly   Pelvic pain in male    PONV (postoperative nausea and vomiting)    Simple renal cyst    Urgency of urination    Urinary hesitancy     Past Surgical History:  Procedure Laterality Date   BUNIONECTOMY Right 01/15/2021   Procedure: ZELL HAI;  Surgeon: Ashley Soulier, DPM;  Location: [facility];  Service: Podiatry;  Laterality: Right;   COLONOSCOPY     COLONOSCOPY WITH PROPOFOL  N/A 06/25/2021   Procedure: COLONOSCOPY WITH PROPOFOL ;  Surgeon: Maryruth Ole DASEN, MD;  Location:  [facility];  Service: Endoscopy;  Laterality: N/A;   CORRECTION OVERLAPPING TOES Right 01/15/2021   Procedure: CORRECTION OVERLAPPING TOES;  Surgeon: Ashley Soulier, DPM;  Location: [facility];  Service: Podiatry;  Laterality: Right;   CYSTO WITH HYDRODISTENSION  06/02/2011   Procedure: CYSTOSCOPY/HYDRODISTENSION;  Surgeon: Glendia DELENA Elizabeth, MD;  Location: [facility];  Service: Urology;  Laterality: N/A;  catheter placement   HAMMER TOE SURGERY Right 01/15/2021   Procedure: HAMMER TOE CORRECTION;  Surgeon: Ashley Soulier, DPM;  Location: [facility];  Service: Podiatry;  Laterality: Right;   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 Swallow whole.   01/08/2024   Azelastine HCl 137 MCG/SPRAY SOLN USE 2 SPRAYS IN EACH NOSTRIL AT NIGHT   Unknown   busPIRone  (BUSPAR ) 10 MG tablet TAKE 1 TABLET BY MOUTH THREE TIMES A DAY AS NEEDED 270 tablet 1 Unknown   DULoxetine  (CYMBALTA ) 60 MG capsule Take 1 capsule (60 mg total) by mouth daily. 90 capsule 1 01/08/2024   fluticasone (FLONASE) 50 MCG/ACT nasal spray Place 1-2 sprays into both nostrils at bedtime.   Unknown   gabapentin  (NEURONTIN ) 800 MG tablet Take 800 mg by mouth 2 (two) times daily.   01/08/2024   hydrocortisone  2.5 % cream Apply topically to aa of face t- thur- sat nightly 30 g 11 Unknown   hydrOXYzine  (ATARAX ) 25 MG tablet Take 50 mg by mouth every evening.   Past Month   ketoconazole  (NIZORAL ) 2 % cream Apply to  affected areas on face at bedtime on M W Fri 60 g 11 Unknown   meloxicam  (MOBIC ) 15 MG tablet Take 15 mg by mouth in the morning.   01/08/2024   Omega-3 Fatty Acids (OMEGA 3 PO) Take 1 capsule by mouth once a week.   01/07/2024   tiZANidine (ZANAFLEX) 4 MG tablet Take 4 mg by mouth at bedtime.   01/07/2024   torsemide  (DEMADEX ) 10 MG tablet TAKE 1 TABLET BY MOUTH EVERY DAY 90 tablet 1 01/08/2024   traMADol (ULTRAM) 50 MG tablet Take 50 mg by mouth every 8 (eight) hours as needed.   Unknown   traZODone  (DESYREL ) 50 MG tablet Take 50 mg by mouth at bedtime as needed.   01/07/2024   gabapentin  (NEURONTIN ) 300 MG capsule TAKE 1 CAPSULE BY MOUTH THREE TIMES A DAY (Patient not taking:  No sig reported) 90 capsule 6 Not Taking   HYDROcodone -acetaminophen  (NORCO) 7.5-325 MG tablet Take 1 tablet by mouth every 4 (four) hours as needed. (Patient not taking: Reported on 01/08/2024)   Not Taking   mupirocin  ointment (BACTROBAN ) 2 % Apply 1 Application topically daily. (Patient not taking: No sig reported) 22 g 0 Not Taking   Social History   Socioeconomic History   Marital status: Married    Spouse name: Not on file   Number of children: 1   Years of education: Not on file   Highest education level: Not on file  Occupational History   Not on file  Tobacco Use   Smoking status: Never   Smokeless tobacco: Never  Vaping Use   Vaping status: Never Used  Substance and Sexual Activity   Alcohol use: Yes    Comment: occ   Drug use: No   Sexual activity: Not on file  Other Topics Concern   Not on file  Social History Narrative   Not on file   Social Drivers of Health   Financial Resource Strain: Not on file  Food Insecurity: No Food Insecurity (01/08/2024)   Hunger Vital Sign    Worried About Running Out of Food in the Last Year: Never true    Ran Out of Food in the Last Year: Never true  Transportation Needs: No Transportation Needs (01/08/2024)   PRAPARE - Administrator, Civil Service  (Medical): No    Lack of Transportation (Non-Medical): No  Physical Activity: Not on file  Stress: No Stress Concern Present (05/22/2023)   [employer] of Occupational Health - Occupational Stress Questionnaire    Feeling of Stress : Only a little  Social Connections: Moderately Isolated (01/08/2024)   Social Connection and Isolation Panel    Frequency of Communication with Friends and Family: More than three times a week    Frequency of Social Gatherings with Friends and Family: More than three times a week    Attends Religious Services: Never    Database administrator or Organizations: No    Attends Banker Meetings: Never    Marital Status: Married  Catering manager Violence: Not At Risk (01/08/2024)   Humiliation, Afraid, Rape, and Kick questionnaire    Fear of Current or Ex-Partner: No    Emotionally Abused: No    Physically Abused: No    Sexually Abused: No    Family History  Problem Relation Age of Onset   Heart disease Mother      Vitals:   01/08/24 2351 01/09/24 0005 01/09/24 0500 01/09/24 0749  BP: 131/72   127/84  Pulse: 60 63  62  Resp: 16   17  Temp: 97.7 F (36.5 C)   (!) 97.5 F (36.4 C)  TempSrc: Oral   Oral  SpO2: 95% 93%  95%  Weight:   96.3 kg   Height:        PHYSICAL EXAM General: Well appearing elderly male, well nourished, in no acute distress. HEENT: Normocephalic and atraumatic. Neck: No JVD.   Lungs: Normal respiratory effort on room air. Clear bilaterally to auscultation. No wheezes, crackles, rhonchi.  Heart: HRRR. Normal S1 and S2 without gallops or murmurs.  Abdomen: Non-distended appearing.  Msk: Normal strength and tone for age. Extremities: Warm and well perfused. No clubbing, cyanosis, edema.  Neuro: Alert and oriented X 3. Psych: Answers questions appropriately.   Labs: Basic Metabolic Panel: Recent Labs    01/08/24 1450 01/09/24  0406  NA 136 134*  K 4.0 3.9  CL 102 102  CO2 25 25  GLUCOSE 104* 91  BUN  19 22  CREATININE 1.10 0.90  CALCIUM  9.2 8.9   Liver Function Tests: Recent Labs    01/08/24 1450  AST 28  ALT 23  ALKPHOS 88  BILITOT 0.5  PROT 7.4  ALBUMIN 4.1   No results for input(s): LIPASE, AMYLASE in the last 72 hours. CBC: Recent Labs    01/08/24 1450 01/09/24 0406  WBC 6.0 5.5  HGB 14.8 13.5  HCT 43.5 41.1  MCV 87.2 88.8  PLT 373 299   Cardiac Enzymes: Recent Labs    01/08/24 1450 01/08/24 2050  TROPONINIHS 3 5   BNP: No results for input(s): BNP in the last 72 hours. D-Dimer: No results for input(s): DDIMER in the last 72 hours. Hemoglobin A1C: No results for input(s): HGBA1C in the last 72 hours. Fasting Lipid Panel: No results for input(s): CHOL, HDL, LDLCALC, TRIG, CHOLHDL, LDLDIRECT in the last 72 hours. Thyroid Function Tests: No results for input(s): TSH, T4TOTAL, T3FREE, THYROIDAB in the last 72 hours.  Invalid input(s): FREET3 Anemia Panel: No results for input(s): VITAMINB12, FOLATE, FERRITIN, TIBC, IRON, RETICCTPCT in the last 72 hours.   Radiology: MR BRAIN WO CONTRAST Result Date: 01/09/2024 CLINICAL DATA:  Neuro deficit, acute, stroke suspected. EXAM: MRI HEAD WITHOUT CONTRAST TECHNIQUE: Multiplanar, multiecho pulse sequences of the brain and surrounding structures were obtained without intravenous contrast. COMPARISON:  CT head 01/08/2024. MRI head 05/10/2023. FINDINGS: Brain: No acute infarction, hemorrhage, hydrocephalus, extra-axial collection or mass lesion. Mildly frontal predominant cerebral atrophy. Mild for age small T2/FLAIR hyperintensities the white matter, compatible with mild chronic microvascular ischemic disease. Vascular: Normal flow voids. Skull and upper cervical spine: Normal marrow signal. Sinuses/Orbits: Clear sinuses.  No acute orbital findings. Other: No mastoid effusions. IMPRESSION: No evidence of acute intracranial abnormality. Electronically Signed   By: Gilmore GORMAN Molt  M.D.   On: 01/09/2024 03:17   CT Head Wo Contrast Result Date: 01/08/2024 CLINICAL DATA:  Neuro deficit, acute, stroke suspected; Neck trauma (Age >= 65y) EXAM: CT HEAD WITHOUT CONTRAST CT CERVICAL SPINE WITHOUT CONTRAST TECHNIQUE: Multidetector CT imaging of the head and cervical spine was performed following the standard protocol without intravenous contrast. Multiplanar CT image reconstructions of the cervical spine were also generated. RADIATION DOSE REDUCTION: This exam was performed according to the departmental dose-optimization program which includes automated exposure control, adjustment of the mA and/or kV according to patient size and/or use of iterative reconstruction technique. COMPARISON:  None Available. FINDINGS: CT HEAD FINDINGS Brain: No evidence of acute infarction, hemorrhage, hydrocephalus, extra-axial collection or mass lesion/mass effect. Cerebral atrophy. Vascular: No hyperdense vessel. Skull: No acute fracture. Sinuses/Orbits: Clear sinuses.  No acute orbital findings. Other: No mastoid effusions. CT CERVICAL SPINE FINDINGS Alignment: Mild facet mediated, degenerative anterolisthesis of C3 on C4 and C4 on C5. Otherwise, no substantial sagittal subluxation. Skull base and vertebrae: No evidence of acute fracture. Soft tissues and spinal canal: No prevertebral fluid or swelling. No visible canal hematoma. Disc levels: Multilevel facet arthropathy, greatest and severe on the right at C4-C5 and the left at C3-C4. When combined with uncovertebral hypertrophy there is resulting varying degrees of neural foraminal stenosis. Upper chest: Lung apices are clear. IMPRESSION: 1. No evidence of acute intracranial abnormality. No evidence of acute fracture or traumatic malalignment in the cervical spine. 2. Multilevel degenerative change, including severe facet arthropathy on the left at C3-C4 and right  at C4-C5. An MRI could better assess the canal and foramina if clinically warranted. Electronically  Signed   By: Gilmore GORMAN Molt M.D.   On: 01/08/2024 18:01   CT Cervical Spine Wo Contrast Result Date: 01/08/2024 CLINICAL DATA:  Neuro deficit, acute, stroke suspected; Neck trauma (Age >= 65y) EXAM: CT HEAD WITHOUT CONTRAST CT CERVICAL SPINE WITHOUT CONTRAST TECHNIQUE: Multidetector CT imaging of the head and cervical spine was performed following the standard protocol without intravenous contrast. Multiplanar CT image reconstructions of the cervical spine were also generated. RADIATION DOSE REDUCTION: This exam was performed according to the departmental dose-optimization program which includes automated exposure control, adjustment of the mA and/or kV according to patient size and/or use of iterative reconstruction technique. COMPARISON:  None Available. FINDINGS: CT HEAD FINDINGS Brain: No evidence of acute infarction, hemorrhage, hydrocephalus, extra-axial collection or mass lesion/mass effect. Cerebral atrophy. Vascular: No hyperdense vessel. Skull: No acute fracture. Sinuses/Orbits: Clear sinuses.  No acute orbital findings. Other: No mastoid effusions. CT CERVICAL SPINE FINDINGS Alignment: Mild facet mediated, degenerative anterolisthesis of C3 on C4 and C4 on C5. Otherwise, no substantial sagittal subluxation. Skull base and vertebrae: No evidence of acute fracture. Soft tissues and spinal canal: No prevertebral fluid or swelling. No visible canal hematoma. Disc levels: Multilevel facet arthropathy, greatest and severe on the right at C4-C5 and the left at C3-C4. When combined with uncovertebral hypertrophy there is resulting varying degrees of neural foraminal stenosis. Upper chest: Lung apices are clear. IMPRESSION: 1. No evidence of acute intracranial abnormality. No evidence of acute fracture or traumatic malalignment in the cervical spine. 2. Multilevel degenerative change, including severe facet arthropathy on the left at C3-C4 and right at C4-C5. An MRI could better assess the canal and foramina  if clinically warranted. Electronically Signed   By: Gilmore GORMAN Molt M.D.   On: 01/08/2024 18:01   DG Chest 2 View Result Date: 01/08/2024 CLINICAL DATA:  Chest pain, progressive weakness for 1 week, dizziness EXAM: CHEST - 2 VIEW COMPARISON:  11/24/2019 FINDINGS: Frontal and lateral views of the chest demonstrate a stable cardiac silhouette. No acute airspace disease, effusion, or pneumothorax. No acute bony abnormalities. Unremarkable bilateral shoulder arthroplasties. IMPRESSION: 1. No acute intrathoracic process. Electronically Signed   By: Ozell Daring M.D.   On: 01/08/2024 17:53    ECHO ordered  TELEMETRY reviewed by me 01/09/2024: sinus rhythm, rate 80s  EKG reviewed by me: Sinus rhythm, rate 94 bpm with nonspecific ST-T wave changes, Q waves noted in inferior and lateral leads.  Data reviewed by me 01/09/2024: last 24h vitals tele labs imaging I/O ED provider note, admission H&P.  Principal Problem:   Syncope and collapse Active Problems:   Benign prostatic hyperplasia   Hyperlipidemia   Insomnia   OSA (obstructive sleep apnea)   GAD (generalized anxiety disorder)   HTN (hypertension)   Depression   Morbid obesity (HCC)   Polypharmacy   Chest discomfort    ASSESSMENT AND PLAN:  Vincent Cole is a 73 y.o. male  with a past medical history of hypertension, hyperlipidemia, history of DVT who presented to the ED on 01/08/2024 for left leg weakness associated with left leg pain that leads to a fall and LOC x1 more recently. Patient reports left sided facial weakness and left eyelid heaviness associated with event. Patient states he saw a neurologist outpatient yesterday and provider referred patient to ED. Patient denies hx of CAD, MI, HF or AF/AFL. Cardiology was consulted for further evaluation.   # Syncope  and collapse Syncope associated left sided leg pain/weakness, left sided facial weakness and left eyelid heaviness per patient. Without lightheadedness/dizziness, CP,  palpitations or SOB. No arrhthymias or significant events on tele.  - Echo ordered.  Further recommendations pending results. - Monitor and replenish electrolytes for a goal K >4, Mag >2  - Recommend adequate hydration. - Will plan to place cardiac monitor at discharge for further evaluation. - Recommend Neurology consult due to unilaterality of sxs.   # Hypertension # Hyperlipidemia Patient is without chest pain. EKG without acute ischemic changes, troponins negative x 2. - Continue aspirin  81 mg daily. - Continue home amlodipine  5 mg-benazepril  40 mg daily. - Patient used to Crestor  10 mg, discontinued in 05/2022. Lipid panel ordered. - Consider outpatient ischemic evaluation due to noted Q waves inferior leads on EKG. Patient is without anginal sxs at this time.   This patient's plan of care was discussed and created with Dr. Florencio and he is in agreement.  Signed: Dorene Comfort, PA-C  01/09/2024, 11:03 AM [facility]

## 2024-01-09 NOTE — Plan of Care (Signed)
  Problem: Education: Goal: Knowledge of condition and prescribed therapy will improve Outcome: Progressing   Problem: Physical Regulation: Goal: Complications related to the disease process, condition or treatment will be avoided or minimized Outcome: Progressing   Problem: Education: Goal: Knowledge of General Education information will improve Description: Including pain rating scale, medication(s)/side effects and non-pharmacologic comfort measures Outcome: Progressing   Problem: Clinical Measurements: Goal: Will remain free from infection Outcome: Progressing   Problem: Coping: Goal: Level of anxiety will decrease Outcome: Progressing   Problem: Safety: Goal: Ability to remain free from injury will improve Outcome: Progressing

## 2024-01-09 NOTE — Progress Notes (Signed)
 Progress Note    Makye Radle  FMW:[id number] DOB: 07-13-1950  DOA: 01/08/2024 PCP: Fernand Fredy RAMAN, MD      Brief Narrative:    Medical records reviewed and are as summarized below:  Vincent Cole is a 73 y.o. male with history of hypertension, hyperlipidemia, GERD, history of DVT, history of left knee arthroplasty, who presented to the hospital with general weakness and near syncope.  Patient said she went to the store on the day of admission and shortly after he felt as if his knees were about to give out and felt like he was going to pass out.  This was associated with dizziness.  No chest pain.  His wife said this has happened about 4 times in a period of 2 weeks.  First episode occurred about 2 weeks ago when patient had just gotten out of the bathroom and he felt weak in his knees, felt dizzy and fell on the floor (fell on his hands).  Timing and details are patchy.   Chart review shows that patient has seen neurologist at [facility] on 04/24/2023 for memory loss, unsteady gait and heaviness in bilateral legs   Assessment/Plan:   Principal Problem:   Syncope and collapse Active Problems:   Chest discomfort   OSA (obstructive sleep apnea)   Hyperlipidemia   Morbid obesity (HCC)   Benign prostatic hyperplasia   Insomnia   GAD (generalized anxiety disorder)   HTN (hypertension)   Depression   Polypharmacy    Body mass index is 36.44 kg/m.   Recurrent near syncope: No orthostatic hypotension.  CT head and MRI brain did not show any acute abnormality.  Labs were unremarkable His wife is convinced that the problem is not coming from his knees and it may something to do with the heart. 2D echo is pending. Consulted cardiologist.  Plan for cardiac event monitor at discharge   History of left knee replacement: However, patient says that the left knee does not cause any pain.  He said he was able to ambulate to the bathroom unassisted this  morning.   Hypertension: Continue amlodipine  and benazepril .  Comorbidities include depression, class II obesity, OSA on CPAP, insomnia    Diet Order             Diet Heart Room service appropriate? Yes; Fluid consistency: Thin  Diet effective now                                  Consultants: Cardiologist  Procedures: None    Medications:    amLODipine   5 mg Oral Daily   And   benazepril   40 mg Oral Daily   aspirin  EC  81 mg Oral Daily   DULoxetine   60 mg Oral Daily   gabapentin   800 mg Oral BID   sodium chloride  flush  3 mL Intravenous Q12H   Continuous Infusions:   Anti-infectives (From admission, onward)    None              Family Communication/Anticipated D/C date and plan/Code Status   DVT prophylaxis: Place TED hose Start: 01/08/24 1916     Code Status: Full Code  Family Communication: Plan discussed with his wife at the bedside Disposition Plan: Plan to discharge home   Status is: Inpatient Remains inpatient appropriate because: Workup for recurrent near syncope/syncope       Subjective:   Interval events noted.  No complaints.  No chest pain, shortness of breath, dizziness or palpitations.  He was able to walk to the bathroom unassisted today.  His wife was at the bedside.  Objective:    Vitals:   01/08/24 2351 01/09/24 0005 01/09/24 0500 01/09/24 0749  BP: 131/72   127/84  Pulse: 60 63  62  Resp: 16   17  Temp: 97.7 F (36.5 C)   (!) 97.5 F (36.4 C)  TempSrc: Oral   Oral  SpO2: 95% 93%  95%  Weight:   96.3 kg   Height:       Orthostatic VS for the past 24 hrs:  BP- Lying Pulse- Lying BP- Sitting Pulse- Sitting BP- Standing at 0 minutes Pulse- Standing at 0 minutes  01/09/24 1030 126/83 67 (!) 131/91 69 (!) 138/92 77     Intake/Output Summary (Last 24 hours) at 01/09/2024 1041 Last data filed at 01/09/2024 0900 Gross per 24 hour  Intake 240 ml  Output --  Net 240 ml   Filed Weights    01/08/24 1447 01/09/24 0500  Weight: 93.9 kg 96.3 kg    Exam:  GEN: NAD SKIN: Warm and dry EYES: No pallor or icterus ENT: MMM CV: RRR PULM: CTA B ABD: soft, obese, NT, +BS CNS: AAO x 3, non focal EXT: No edema or tenderness        Data Reviewed:   I have personally reviewed following labs and imaging studies:  Labs: Labs show the following:   Basic Metabolic Panel: Recent Labs  Lab 01/08/24 1450 01/09/24 0406  NA 136 134*  K 4.0 3.9  CL 102 102  CO2 25 25  GLUCOSE 104* 91  BUN 19 22  CREATININE 1.10 0.90  CALCIUM  9.2 8.9   GFR Estimated Creatinine Clearance: 75.4 mL/min (by C-G formula based on SCr of 0.9 mg/dL). Liver Function Tests: Recent Labs  Lab 01/08/24 1450  AST 28  ALT 23  ALKPHOS 88  BILITOT 0.5  PROT 7.4  ALBUMIN 4.1   No results for input(s): LIPASE, AMYLASE in the last 168 hours. No results for input(s): AMMONIA in the last 168 hours. Coagulation profile No results for input(s): INR, PROTIME in the last 168 hours.  CBC: Recent Labs  Lab 01/08/24 1450 01/09/24 0406  WBC 6.0 5.5  HGB 14.8 13.5  HCT 43.5 41.1  MCV 87.2 88.8  PLT 373 299   Cardiac Enzymes: No results for input(s): CKTOTAL, CKMB, CKMBINDEX, TROPONINI in the last 168 hours. BNP (last 3 results) No results for input(s): PROBNP in the last 8760 hours. CBG: No results for input(s): GLUCAP in the last 168 hours. D-Dimer: No results for input(s): DDIMER in the last 72 hours. Hgb A1c: No results for input(s): HGBA1C in the last 72 hours. Lipid Profile: No results for input(s): CHOL, HDL, LDLCALC, TRIG, CHOLHDL, LDLDIRECT in the last 72 hours. Thyroid function studies: No results for input(s): TSH, T4TOTAL, T3FREE, THYROIDAB in the last 72 hours.  Invalid input(s): FREET3 Anemia work up: No results for input(s): VITAMINB12, FOLATE, FERRITIN, TIBC, IRON, RETICCTPCT in the last 72 hours. Sepsis  Labs: Recent Labs  Lab 01/08/24 1450 01/09/24 0406  WBC 6.0 5.5    Microbiology No results found for this or any previous visit (from the past 240 hours).  Procedures and diagnostic studies:  MR BRAIN WO CONTRAST Result Date: 01/09/2024 CLINICAL DATA:  Neuro deficit, acute, stroke suspected. EXAM: MRI HEAD WITHOUT CONTRAST TECHNIQUE: Multiplanar, multiecho pulse sequences of the brain and surrounding  structures were obtained without intravenous contrast. COMPARISON:  CT head 01/08/2024. MRI head 05/10/2023. FINDINGS: Brain: No acute infarction, hemorrhage, hydrocephalus, extra-axial collection or mass lesion. Mildly frontal predominant cerebral atrophy. Mild for age small T2/FLAIR hyperintensities the white matter, compatible with mild chronic microvascular ischemic disease. Vascular: Normal flow voids. Skull and upper cervical spine: Normal marrow signal. Sinuses/Orbits: Clear sinuses.  No acute orbital findings. Other: No mastoid effusions. IMPRESSION: No evidence of acute intracranial abnormality. Electronically Signed   By: Gilmore GORMAN Molt M.D.   On: 01/09/2024 03:17   CT Head Wo Contrast Result Date: 01/08/2024 CLINICAL DATA:  Neuro deficit, acute, stroke suspected; Neck trauma (Age >= 65y) EXAM: CT HEAD WITHOUT CONTRAST CT CERVICAL SPINE WITHOUT CONTRAST TECHNIQUE: Multidetector CT imaging of the head and cervical spine was performed following the standard protocol without intravenous contrast. Multiplanar CT image reconstructions of the cervical spine were also generated. RADIATION DOSE REDUCTION: This exam was performed according to the departmental dose-optimization program which includes automated exposure control, adjustment of the mA and/or kV according to patient size and/or use of iterative reconstruction technique. COMPARISON:  None Available. FINDINGS: CT HEAD FINDINGS Brain: No evidence of acute infarction, hemorrhage, hydrocephalus, extra-axial collection or mass lesion/mass  effect. Cerebral atrophy. Vascular: No hyperdense vessel. Skull: No acute fracture. Sinuses/Orbits: Clear sinuses.  No acute orbital findings. Other: No mastoid effusions. CT CERVICAL SPINE FINDINGS Alignment: Mild facet mediated, degenerative anterolisthesis of C3 on C4 and C4 on C5. Otherwise, no substantial sagittal subluxation. Skull base and vertebrae: No evidence of acute fracture. Soft tissues and spinal canal: No prevertebral fluid or swelling. No visible canal hematoma. Disc levels: Multilevel facet arthropathy, greatest and severe on the right at C4-C5 and the left at C3-C4. When combined with uncovertebral hypertrophy there is resulting varying degrees of neural foraminal stenosis. Upper chest: Lung apices are clear. IMPRESSION: 1. No evidence of acute intracranial abnormality. No evidence of acute fracture or traumatic malalignment in the cervical spine. 2. Multilevel degenerative change, including severe facet arthropathy on the left at C3-C4 and right at C4-C5. An MRI could better assess the canal and foramina if clinically warranted. Electronically Signed   By: Gilmore GORMAN Molt M.D.   On: 01/08/2024 18:01   CT Cervical Spine Wo Contrast Result Date: 01/08/2024 CLINICAL DATA:  Neuro deficit, acute, stroke suspected; Neck trauma (Age >= 65y) EXAM: CT HEAD WITHOUT CONTRAST CT CERVICAL SPINE WITHOUT CONTRAST TECHNIQUE: Multidetector CT imaging of the head and cervical spine was performed following the standard protocol without intravenous contrast. Multiplanar CT image reconstructions of the cervical spine were also generated. RADIATION DOSE REDUCTION: This exam was performed according to the departmental dose-optimization program which includes automated exposure control, adjustment of the mA and/or kV according to patient size and/or use of iterative reconstruction technique. COMPARISON:  None Available. FINDINGS: CT HEAD FINDINGS Brain: No evidence of acute infarction, hemorrhage, hydrocephalus,  extra-axial collection or mass lesion/mass effect. Cerebral atrophy. Vascular: No hyperdense vessel. Skull: No acute fracture. Sinuses/Orbits: Clear sinuses.  No acute orbital findings. Other: No mastoid effusions. CT CERVICAL SPINE FINDINGS Alignment: Mild facet mediated, degenerative anterolisthesis of C3 on C4 and C4 on C5. Otherwise, no substantial sagittal subluxation. Skull base and vertebrae: No evidence of acute fracture. Soft tissues and spinal canal: No prevertebral fluid or swelling. No visible canal hematoma. Disc levels: Multilevel facet arthropathy, greatest and severe on the right at C4-C5 and the left at C3-C4. When combined with uncovertebral hypertrophy there is resulting varying degrees of neural foraminal stenosis.  Upper chest: Lung apices are clear. IMPRESSION: 1. No evidence of acute intracranial abnormality. No evidence of acute fracture or traumatic malalignment in the cervical spine. 2. Multilevel degenerative change, including severe facet arthropathy on the left at C3-C4 and right at C4-C5. An MRI could better assess the canal and foramina if clinically warranted. Electronically Signed   By: Gilmore GORMAN Molt M.D.   On: 01/08/2024 18:01   DG Chest 2 View Result Date: 01/08/2024 CLINICAL DATA:  Chest pain, progressive weakness for 1 week, dizziness EXAM: CHEST - 2 VIEW COMPARISON:  11/24/2019 FINDINGS: Frontal and lateral views of the chest demonstrate a stable cardiac silhouette. No acute airspace disease, effusion, or pneumothorax. No acute bony abnormalities. Unremarkable bilateral shoulder arthroplasties. IMPRESSION: 1. No acute intrathoracic process. Electronically Signed   By: Ozell Daring M.D.   On: 01/08/2024 17:53               LOS: 1 day   Hanish Laraia  [facility]   Pager on [email or web address]. If 7PM-7AM, please contact night-coverage at [email or web address]     01/09/2024, 10:41 AM

## 2024-01-10 ENCOUNTER — Observation Stay

## 2024-01-10 ENCOUNTER — Encounter: Admitting: Dermatology

## 2024-01-10 DIAGNOSIS — R569 Unspecified convulsions: Secondary | ICD-10-CM | POA: Diagnosis not present

## 2024-01-10 DIAGNOSIS — R55 Syncope and collapse: Secondary | ICD-10-CM | POA: Diagnosis not present

## 2024-01-10 LAB — LIPID PANEL
Cholesterol: 213 mg/dL — ABNORMAL HIGH (ref 0–200)
HDL: 60 mg/dL (ref >40)
LDL Cholesterol: 140 mg/dL — ABNORMAL HIGH (ref 0–99)
Total CHOL/HDL Ratio: 3.6 ratio
Triglycerides: 64 mg/dL (ref <150)
VLDL: 13 mg/dL (ref 0–40)

## 2024-01-10 LAB — ECHOCARDIOGRAM COMPLETE
AR max vel: 3.17 cm2
AV Area VTI: 3.06 cm2
AV Area mean vel: 3.06 cm2
AV Mean grad: 3 mmHg
AV Peak grad: 5.5 mmHg
Ao pk vel: 1.17 m/s
Area-P 1/2: 2.99 cm2
Calc EF: 51.6 %
Height: 64 in
MV VTI: 3.15 cm2
S' Lateral: 2.7 cm
Single Plane A2C EF: 47.6 %
Single Plane A4C EF: 56.3 %
Weight: 3396.85 [oz_av]

## 2024-01-10 LAB — BASIC METABOLIC PANEL WITH GFR
Anion gap: 9 (ref 5–15)
BUN: 17 mg/dL (ref 8–23)
CO2: 21 mmol/L — ABNORMAL LOW (ref 22–32)
Calcium: 8.8 mg/dL — ABNORMAL LOW (ref 8.9–10.3)
Chloride: 103 mmol/L (ref 98–111)
Creatinine, Ser: 0.73 mg/dL (ref 0.61–1.24)
GFR, Estimated: >60 mL/min (ref >60)
Glucose, Bld: 90 mg/dL (ref 70–99)
Potassium: 3.8 mmol/L (ref 3.5–5.1)
Sodium: 133 mmol/L — ABNORMAL LOW (ref 135–145)

## 2024-01-10 LAB — GLUCOSE, CAPILLARY: Glucose-Capillary: 95 mg/dL (ref 70–99)

## 2024-01-10 MED ORDER — ROSUVASTATIN CALCIUM 10 MG PO TABS
10.0000 mg | ORAL_TABLET | Freq: Every day | ORAL | Status: DC
  Administered 2024-01-10 – 2024-01-11 (×2): 10 mg via ORAL
  Filled 2024-01-10 (×2): qty 1

## 2024-01-10 MED ORDER — IOHEXOL 350 MG/ML SOLN
75.0000 mL | Freq: Once | INTRAVENOUS | Status: AC | PRN
  Administered 2024-01-10: 75 mL via INTRAVENOUS

## 2024-01-10 NOTE — Procedures (Signed)
 Patient Name: Shemar Plemmons  MRN: [id number]  Epilepsy Attending: Arlin MALVA Krebs  Referring Physician/Provider: Voncile Isles, MD  Date: 01/10/2024 Duration: 39.53 mins  Patient history: 73 y.o. male presenting for evaluation of multiple days of syncopal and near syncopal episode with at least 1 episode where he lost consciousness. EEG to evaluate for seizure  Level of alertness: Awake, asleep  AEDs during EEG study: GBP  Technical aspects: This EEG study was done with scalp electrodes positioned according to the 10-20 International system of electrode placement. Electrical activity was reviewed with band pass filter of 1-70Hz , sensitivity of 7 uV/mm, display speed of 66mm/sec with a 60Hz  notched filter applied as appropriate. EEG data were recorded continuously and digitally stored.  Video monitoring was available and reviewed as appropriate.  Description: The posterior dominant rhythm consists of 9-10 Hz activity of moderate voltage (25-35 uV) seen predominantly in posterior head regions, symmetric and reactive to eye opening and eye closing. Sleep was characterized by vertex waves, sleep spindles (12 to 14 Hz), maximal frontocentral region. Hyperventilation and photic stimulation were not performed.     IMPRESSION: This study is within normal limits. No seizures or epileptiform discharges were seen throughout the recording.  A normal interictal EEG does not exclude the diagnosis of epilepsy.   Rabon Scholle O Jakiyah Stepney

## 2024-01-10 NOTE — Plan of Care (Signed)
  Problem: Cardiac: Goal: Will achieve and/or maintain adequate cardiac output Outcome: Progressing   Problem: Physical Regulation: Goal: Complications related to the disease process, condition or treatment will be avoided or minimized Outcome: Progressing   Problem: Education: Goal: Knowledge of General Education information will improve Description: Including pain rating scale, medication(s)/side effects and non-pharmacologic comfort measures Outcome: Progressing   Problem: Clinical Measurements: Goal: Will remain free from infection Outcome: Progressing Goal: Diagnostic test results will improve Outcome: Progressing

## 2024-01-10 NOTE — Progress Notes (Signed)
 [facility] CLINIC CARDIOLOGY PROGRESS NOTE       Patient ID: Vincent Cole MRN: [id number] DOB/AGE: August 31, 1950 73 y.o.  Admit date: 01/08/2024 Referring Physician Dr. Jens Primary Physician Vincent Fredy RAMAN, MD Primary Cardiologist None Reason for Consultation recurrent syncope  HPI: Vincent Cole is a 73 y.o. male  with a past medical history of hypertension, hyperlipidemia, history of DVT who presented to the ED on 01/08/2024 for left leg weakness associated with left leg pain that leads to a fall and LOC x1 more recently. Patient reports left sided facial weakness and left eyelid heaviness associated with event. Patient states he saw a neurologist outpatient yesterday and provider referred patient to ED. Patient denies hx of CAD, MI, HF or AF/AFL. Cardiology was consulted for further evaluation.   Interval history: - Patient seen and examined this morning, resting comfortably in hospital bed. - No events noted on telemetry.  No evidence of any high-grade AV block. - Denies any significant dizziness/weakness.  Has not been out of bed much since admission. - Echo results reviewed today with patient.   Review of systems complete and found to be negative unless listed above    Past Medical History:  Diagnosis Date   Abdominal pain 01/17/2011   Formatting of this note might be different from the original.  Note: Unchanged - chronic  suprapubic/lower abdominal area since TURP in 2005; urology has now diagnosed, after extensive evaluation, interstitial cystitis; has also had repeat TURP in June 2010, and a surgery on the neck of the bladder in July of 2011     Actinic keratosis    Arthritis    Benign bladder mass    Complete tear of left rotator cuff 04/19/2017   Complication of anesthesia    PONV x 1 in 2010   Deep venous thrombosis (HCC) 12/30/2023   Depression    Frequency of urination    GERD (gastroesophageal reflux disease) no meds   Heart murmur    was told many years ago    History of DVT of lower extremity 02/03/2020   History of recurrent UTIs    Hypertension    IC (interstitial cystitis)    Nocturia    OSA on CPAP nightly   Pelvic pain in male    PONV (postoperative nausea and vomiting)    Simple renal cyst    Urgency of urination    Urinary hesitancy     Past Surgical History:  Procedure Laterality Date   BUNIONECTOMY Right 01/15/2021   Procedure: ZELL HAI;  Surgeon: Ashley Soulier, DPM;  Location: [facility];  Service: Podiatry;  Laterality: Right;   COLONOSCOPY     COLONOSCOPY WITH PROPOFOL  N/A 06/25/2021   Procedure: COLONOSCOPY WITH PROPOFOL ;  Surgeon: Maryruth Ole DASEN, MD;  Location: [facility];  Service: Endoscopy;  Laterality: N/A;   CORRECTION OVERLAPPING TOES Right 01/15/2021   Procedure: CORRECTION OVERLAPPING TOES;  Surgeon: Ashley Soulier, DPM;  Location: [facility];  Service: Podiatry;  Laterality: Right;   CYSTO WITH HYDRODISTENSION  06/02/2011   Procedure: CYSTOSCOPY/HYDRODISTENSION;  Surgeon: Glendia DELENA Elizabeth, MD;  Location: [facility];  Service: Urology;  Laterality: N/A;  catheter placement   HAMMER TOE SURGERY Right 01/15/2021   Procedure: HAMMER TOE CORRECTION;  Surgeon: Ashley Soulier, DPM;  Location: [facility];  Service: Podiatry;  Laterality: Right;   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 Swallow whole.   01/08/2024   Azelastine HCl 137 MCG/SPRAY SOLN USE 2 SPRAYS IN EACH NOSTRIL  AT NIGHT   Unknown   busPIRone  (BUSPAR ) 10 MG tablet TAKE 1 TABLET BY MOUTH THREE TIMES A DAY AS NEEDED 270 tablet 1 Unknown   DULoxetine  (CYMBALTA ) 60 MG capsule Take 1 capsule (60 mg total) by mouth daily. 90 capsule 1 01/08/2024   fluticasone (FLONASE) 50 MCG/ACT nasal spray Place 1-2 sprays into both nostrils at bedtime.   Unknown   gabapentin  (NEURONTIN ) 800 MG tablet Take 800 mg by mouth 2 (two) times daily.   01/08/2024   hydrocortisone  2.5 % cream Apply topically to aa of face t- thur- sat nightly 30 g 11 Unknown   hydrOXYzine  (ATARAX ) 25 MG tablet Take 50 mg by mouth every evening.   Past Month   ketoconazole  (NIZORAL ) 2 % cream Apply to affected areas on face at bedtime on M W Fri 60 g 11 Unknown   meloxicam  (MOBIC ) 15 MG tablet Take 15 mg by mouth in the morning.   01/08/2024   Omega-3 Fatty Acids (OMEGA 3 PO) Take 1 capsule by mouth once a week.   01/07/2024   tiZANidine (ZANAFLEX) 4 MG tablet Take 4 mg by mouth at bedtime.   01/07/2024   torsemide  (DEMADEX ) 10 MG tablet TAKE 1 TABLET BY MOUTH EVERY DAY 90 tablet 1 01/08/2024   traMADol (ULTRAM) 50 MG tablet Take 50 mg by mouth every 8 (eight) hours as needed.   Unknown   traZODone  (DESYREL ) 50 MG tablet Take 50 mg by mouth at bedtime as needed.   01/07/2024   gabapentin  (NEURONTIN ) 300 MG capsule TAKE 1 CAPSULE BY MOUTH THREE TIMES A DAY (Patient not taking: No sig reported) 90 capsule 6 Not Taking   HYDROcodone -acetaminophen  (NORCO) 7.5-325 MG tablet Take 1 tablet by mouth every 4 (four) hours as needed. (Patient not taking: Reported on 01/08/2024)   Not Taking   mupirocin  ointment (BACTROBAN ) 2 % Apply 1 Application topically daily. (Patient not taking: No sig reported) 22 g 0 Not Taking   Social History   Socioeconomic History   Marital status: Married    Spouse name: Not on file   Number of children: 1   Years of education: Not on file   Highest education level: Not on file  Occupational History   Not on file  Tobacco Use    Smoking status: Never   Smokeless tobacco: Never  Vaping Use   Vaping status: Never Used  Substance and Sexual Activity   Alcohol use: Yes    Comment: occ   Drug use: No   Sexual activity: Not on file  Other Topics Concern   Not on file  Social History Narrative   Not on file   Social Drivers of Health   Financial Resource Strain: Not on file  Food Insecurity: No Food Insecurity (01/08/2024)   Hunger Vital Sign    Worried About Running Out of Food in the Last Year: Never true    Ran Out of  Food in the Last Year: Never true  Transportation Needs: No Transportation Needs (01/08/2024)   PRAPARE - Administrator, Civil Service (Medical): No    Lack of Transportation (Non-Medical): No  Physical Activity: Not on file  Stress: No Stress Concern Present (05/22/2023)   [employer] of Occupational Health - Occupational Stress Questionnaire    Feeling of Stress : Only a little  Social Connections: Moderately Isolated (01/08/2024)   Social Connection and Isolation Panel    Frequency of Communication with Friends and Family: More than three times a week    Frequency of Social Gatherings with Friends and Family: More than three times a week    Attends Religious Services: Never    Database administrator or Organizations: No    Attends Banker Meetings: Never    Marital Status: Married  Catering manager Violence: Not At Risk (01/08/2024)   Humiliation, Afraid, Rape, and Kick questionnaire    Fear of Current or Ex-Partner: No    Emotionally Abused: No    Physically Abused: No    Sexually Abused: No    Family History  Problem Relation Age of Onset   Heart disease Mother      Vitals:   01/10/24 0033 01/10/24 0415 01/10/24 0500 01/10/24 0825  BP: 126/78 119/83  129/81  Pulse: 75 70  62  Resp: 16 16  18   Temp: (!) 97.5 F (36.4 C) 97.6 F (36.4 C)  (!) 97.4 F (36.3 C)  TempSrc: Oral Oral    SpO2: 98% 96%  95%  Weight:   95.4 kg   Height:         PHYSICAL EXAM General: Well appearing elderly male, well nourished, in no acute distress. HEENT: Normocephalic and atraumatic. Neck: No JVD.   Lungs: Normal respiratory effort on room air. Clear bilaterally to auscultation. No wheezes, crackles, rhonchi.  Heart: HRRR. Normal S1 and S2 without gallops or murmurs.  Abdomen: Non-distended appearing.  Msk: Normal strength and tone for age. Extremities: Warm and well perfused. No clubbing, cyanosis, edema.  Neuro: Alert and oriented X 3. Psych: Answers questions appropriately.   Labs: Basic Metabolic Panel: Recent Labs    01/09/24 0406 01/10/24 0610  NA 134* 133*  K 3.9 3.8  CL 102 103  CO2 25 21*  GLUCOSE 91 90  BUN 22 17  CREATININE 0.90 0.73  CALCIUM  8.9 8.8*   Liver Function Tests: Recent Labs    01/08/24 1450  AST 28  ALT 23  ALKPHOS 88  BILITOT 0.5  PROT 7.4  ALBUMIN 4.1   No results for input(s): LIPASE, AMYLASE in the last 72 hours. CBC: Recent Labs    01/08/24 1450 01/09/24 0406  WBC 6.0 5.5  HGB 14.8 13.5  HCT 43.5 41.1  MCV 87.2 88.8  PLT 373 299   Cardiac Enzymes: Recent Labs    01/08/24 1450 01/08/24 2050  TROPONINIHS 3 5   BNP: No results for input(s): BNP in the last 72 hours. D-Dimer: No results for input(s): DDIMER in the last 72 hours. Hemoglobin A1C: No results for input(s): HGBA1C in the last 72 hours. Fasting Lipid Panel: Recent Labs    01/10/24 0610  CHOL 213*  HDL 60  LDLCALC 140*  TRIG 64  CHOLHDL 3.6   Thyroid Function Tests: No results for input(s): TSH, T4TOTAL, T3FREE, THYROIDAB in the last 72 hours.  Invalid input(s): FREET3 Anemia Panel: No results for input(s): VITAMINB12, FOLATE, FERRITIN, TIBC, IRON, RETICCTPCT in the  last 72 hours.   Radiology: ECHOCARDIOGRAM COMPLETE Result Date: 01/10/2024    ECHOCARDIOGRAM REPORT   Patient Name:   Vincent Cole Date of Exam: 01/09/2024 Medical Rec #:  [id number]      Height:       64.0  in Accession #:    [id number]     Weight:       212.3 lb Date of Birth:  [birth year]      BSA:          2.007 m Patient Age:    73 years       BP:           125/77 mmHg Patient Gender: M              HR:           74 bpm. Exam Location:  [facility] Procedure: 2D Echo, Cardiac Doppler and Color Doppler (Both Spectral and Color            Flow Doppler were utilized during procedure). Indications:     Syncope R55  History:         Patient has no prior history of Echocardiogram examinations.  Sonographer:     Ashley McNeely-Sloane Referring Phys:  [id number] DORENE COMFORT Diagnosing Phys: Dwayne D Callwood MD IMPRESSIONS  1. Left ventricular ejection fraction, by estimation, is 55 to 60%. The left ventricle has normal function. The left ventricle has no regional wall motion abnormalities. Left ventricular diastolic parameters are consistent with Grade I diastolic dysfunction (impaired relaxation).  2. Right ventricular systolic function is low normal. The right ventricular size is mildly enlarged.  3. The mitral valve is normal in structure. No evidence of mitral valve regurgitation.  4. The aortic valve is normal in structure. Aortic valve regurgitation is not visualized. Conclusion(s)/Recommendation(s): Poor windows for evaluation of left ventricular function by transthoracic echocardiography. Would recommend an alternative means of evaluation. FINDINGS  Left Ventricle: Left ventricular ejection fraction, by estimation, is 55 to 60%. The left ventricle has normal function. The left ventricle has no regional wall motion abnormalities. Strain was performed and the global longitudinal strain is indeterminate. The left ventricular internal cavity size was normal in size. There is no left ventricular hypertrophy. Left ventricular diastolic parameters are consistent with Grade I diastolic dysfunction (impaired relaxation). Right Ventricle: The right ventricular size is mildly enlarged. No increase in right ventricular wall  thickness. Right ventricular systolic function is low normal. Left Atrium: Left atrial size was normal in size. Right Atrium: Right atrial size was normal in size. Pericardium: There is no evidence of pericardial effusion. Mitral Valve: The mitral valve is normal in structure. No evidence of mitral valve regurgitation. MV peak gradient, 3.5 mmHg. The mean mitral valve gradient is 1.0 mmHg. Tricuspid Valve: The tricuspid valve is normal in structure. Tricuspid valve regurgitation is trivial. Aortic Valve: The aortic valve is normal in structure. Aortic valve regurgitation is not visualized. Aortic valve mean gradient measures 3.0 mmHg. Aortic valve peak gradient measures 5.5 mmHg. Aortic valve area, by VTI measures 3.06 cm. Pulmonic Valve: The pulmonic valve was normal in structure. Pulmonic valve regurgitation is trivial. Aorta: The ascending aorta was not well visualized. IAS/Shunts: No atrial level shunt detected by color flow Doppler. Additional Comments: 3D was performed not requiring image post processing on an independent workstation and was indeterminate.  LEFT VENTRICLE PLAX 2D LVIDd:         4.10 cm     Diastology LVIDs:  2.70 cm     LV e' medial:    5.44 cm/s LV PW:         1.00 cm     LV E/e' medial:  9.0 LV IVS:        1.00 cm     LV e' lateral:   10.10 cm/s LVOT diam:     2.20 cm     LV E/e' lateral: 4.8 LV SV:         70 LV SV Index:   35 LVOT Area:     3.80 cm  LV Volumes (MOD) LV vol d, MOD A2C: 88.0 ml LV vol d, MOD A4C: 70.3 ml LV vol s, MOD A2C: 46.1 ml LV vol s, MOD A4C: 30.7 ml LV SV MOD A2C:     41.9 ml LV SV MOD A4C:     70.3 ml LV SV MOD BP:      41.2 ml RIGHT VENTRICLE RV Basal diam:  4.20 cm RV Mid diam:    4.00 cm TAPSE (M-mode): 2.7 cm LEFT ATRIUM             Index        RIGHT ATRIUM           Index LA diam:        3.60 cm 1.79 cm/m   RA Area:     19.10 cm LA Vol (A2C):   54.6 ml 27.21 ml/m  RA Volume:   52.60 ml  26.21 ml/m LA Vol (A4C):   31.6 ml 15.75 ml/m LA Biplane  Vol: 41.5 ml 20.68 ml/m  AORTIC VALVE                    PULMONIC VALVE AV Area (Vmax):    3.17 cm     PV Vmax:        1.48 m/s AV Area (Vmean):   3.06 cm     PV Vmean:       98.400 cm/s AV Area (VTI):     3.06 cm     PV VTI:         0.258 m AV Vmax:           117.00 cm/s  PV Peak grad:   8.8 mmHg AV Vmean:          80.100 cm/s  PV Mean grad:   5.0 mmHg AV VTI:            0.227 m      RVOT Peak grad: 4 mmHg AV Peak Grad:      5.5 mmHg AV Mean Grad:      3.0 mmHg LVOT Vmax:         97.50 cm/s LVOT Vmean:        64.500 cm/s LVOT VTI:          0.183 m LVOT/AV VTI ratio: 0.81  AORTA Ao Root diam: 3.50 cm Ao Asc diam:  3.50 cm MITRAL VALVE MV Area (PHT): 2.99 cm    SHUNTS MV Area VTI:   3.15 cm    Systemic VTI:  0.18 m MV Peak grad:  3.5 mmHg    Systemic Diam: 2.20 cm MV Mean grad:  1.0 mmHg    Pulmonic VTI:  0.176 m MV Vmax:       0.93 m/s MV Vmean:      55.2 cm/s MV Decel Time: 254 msec MV E velocity: 48.70 cm/s MV A velocity: 94.90 cm/s MV E/A ratio:  0.51 Cara JONETTA Lovelace MD Electronically signed by Cara JONETTA Lovelace MD Signature Date/Time: 01/10/2024/7:42:55 AM    Final    MR BRAIN WO CONTRAST Result Date: 01/09/2024 CLINICAL DATA:  Neuro deficit, acute, stroke suspected. EXAM: MRI HEAD WITHOUT CONTRAST TECHNIQUE: Multiplanar, multiecho pulse sequences of the brain and surrounding structures were obtained without intravenous contrast. COMPARISON:  CT head 01/08/2024. MRI head 05/10/2023. FINDINGS: Brain: No acute infarction, hemorrhage, hydrocephalus, extra-axial collection or mass lesion. Mildly frontal predominant cerebral atrophy. Mild for age small T2/FLAIR hyperintensities the white matter, compatible with mild chronic microvascular ischemic disease. Vascular: Normal flow voids. Skull and upper cervical spine: Normal marrow signal. Sinuses/Orbits: Clear sinuses.  No acute orbital findings. Other: No mastoid effusions. IMPRESSION: No evidence of acute intracranial abnormality. Electronically Signed    By: Gilmore GORMAN Molt M.D.   On: 01/09/2024 03:17   CT Head Wo Contrast Result Date: 01/08/2024 CLINICAL DATA:  Neuro deficit, acute, stroke suspected; Neck trauma (Age >= 65y) EXAM: CT HEAD WITHOUT CONTRAST CT CERVICAL SPINE WITHOUT CONTRAST TECHNIQUE: Multidetector CT imaging of the head and cervical spine was performed following the standard protocol without intravenous contrast. Multiplanar CT image reconstructions of the cervical spine were also generated. RADIATION DOSE REDUCTION: This exam was performed according to the departmental dose-optimization program which includes automated exposure control, adjustment of the mA and/or kV according to patient size and/or use of iterative reconstruction technique. COMPARISON:  None Available. FINDINGS: CT HEAD FINDINGS Brain: No evidence of acute infarction, hemorrhage, hydrocephalus, extra-axial collection or mass lesion/mass effect. Cerebral atrophy. Vascular: No hyperdense vessel. Skull: No acute fracture. Sinuses/Orbits: Clear sinuses.  No acute orbital findings. Other: No mastoid effusions. CT CERVICAL SPINE FINDINGS Alignment: Mild facet mediated, degenerative anterolisthesis of C3 on C4 and C4 on C5. Otherwise, no substantial sagittal subluxation. Skull base and vertebrae: No evidence of acute fracture. Soft tissues and spinal canal: No prevertebral fluid or swelling. No visible canal hematoma. Disc levels: Multilevel facet arthropathy, greatest and severe on the right at C4-C5 and the left at C3-C4. When combined with uncovertebral hypertrophy there is resulting varying degrees of neural foraminal stenosis. Upper chest: Lung apices are clear. IMPRESSION: 1. No evidence of acute intracranial abnormality. No evidence of acute fracture or traumatic malalignment in the cervical spine. 2. Multilevel degenerative change, including severe facet arthropathy on the left at C3-C4 and right at C4-C5. An MRI could better assess the canal and foramina if clinically  warranted. Electronically Signed   By: Gilmore GORMAN Molt M.D.   On: 01/08/2024 18:01   CT Cervical Spine Wo Contrast Result Date: 01/08/2024 CLINICAL DATA:  Neuro deficit, acute, stroke suspected; Neck trauma (Age >= 65y) EXAM: CT HEAD WITHOUT CONTRAST CT CERVICAL SPINE WITHOUT CONTRAST TECHNIQUE: Multidetector CT imaging of the head and cervical spine was performed following the standard protocol without intravenous contrast. Multiplanar CT image reconstructions of the cervical spine were also generated. RADIATION DOSE REDUCTION: This exam was performed according to the departmental dose-optimization program which includes automated exposure control, adjustment of the mA and/or kV according to patient size and/or use of iterative reconstruction technique. COMPARISON:  None Available. FINDINGS: CT HEAD FINDINGS Brain: No evidence of acute infarction, hemorrhage, hydrocephalus, extra-axial collection or mass lesion/mass effect. Cerebral atrophy. Vascular: No hyperdense vessel. Skull: No acute fracture. Sinuses/Orbits: Clear sinuses.  No acute orbital findings. Other: No mastoid effusions. CT CERVICAL SPINE FINDINGS Alignment: Mild facet mediated, degenerative anterolisthesis of C3 on C4 and C4 on C5. Otherwise, no substantial sagittal subluxation. Skull base  and vertebrae: No evidence of acute fracture. Soft tissues and spinal canal: No prevertebral fluid or swelling. No visible canal hematoma. Disc levels: Multilevel facet arthropathy, greatest and severe on the right at C4-C5 and the left at C3-C4. When combined with uncovertebral hypertrophy there is resulting varying degrees of neural foraminal stenosis. Upper chest: Lung apices are clear. IMPRESSION: 1. No evidence of acute intracranial abnormality. No evidence of acute fracture or traumatic malalignment in the cervical spine. 2. Multilevel degenerative change, including severe facet arthropathy on the left at C3-C4 and right at C4-C5. An MRI could better  assess the canal and foramina if clinically warranted. Electronically Signed   By: Gilmore GORMAN Molt M.D.   On: 01/08/2024 18:01   DG Chest 2 View Result Date: 01/08/2024 CLINICAL DATA:  Chest pain, progressive weakness for 1 week, dizziness EXAM: CHEST - 2 VIEW COMPARISON:  11/24/2019 FINDINGS: Frontal and lateral views of the chest demonstrate a stable cardiac silhouette. No acute airspace disease, effusion, or pneumothorax. No acute bony abnormalities. Unremarkable bilateral shoulder arthroplasties. IMPRESSION: 1. No acute intrathoracic process. Electronically Signed   By: Ozell Daring M.D.   On: 01/08/2024 17:53    ECHO as above  TELEMETRY reviewed by me 01/10/2024: sinus rhythm, rate 70s  EKG reviewed by me: Sinus rhythm, rate 94 bpm with nonspecific ST-T wave changes, Q waves noted in inferior and lateral leads.  Data reviewed by me 01/10/2024: last 24h vitals tele labs imaging I/O hospitalist progress note  Principal Problem:   Syncope and collapse Active Problems:   Benign prostatic hyperplasia   Hyperlipidemia   Insomnia   OSA (obstructive sleep apnea)   GAD (generalized anxiety disorder)   HTN (hypertension)   Depression   Morbid obesity (HCC)   Polypharmacy   Chest discomfort   Near syncope    ASSESSMENT AND PLAN:  Vincent Cole is a 73 y.o. male  with a past medical history of hypertension, hyperlipidemia, history of DVT who presented to the ED on 01/08/2024 for left leg weakness associated with left leg pain that leads to a fall and LOC x1 more recently. Patient reports left sided facial weakness and left eyelid heaviness associated with event. Patient states he saw a neurologist outpatient yesterday and provider referred patient to ED. Patient denies hx of CAD, MI, HF or AF/AFL. Cardiology was consulted for further evaluation.   # Syncope and collapse Syncope associated left sided leg pain/weakness, left sided facial weakness and left eyelid heaviness per patient.  Without lightheadedness/dizziness, CP, palpitations or SOB. No arrhthymias or significant events on tele.  Echo this admission with EF 55-60%, no wall motion abnormalities, grade 1 diastolic dysfunction, mildly enlarged RV with low normal function, no MR. - Monitor and replenish electrolytes for a goal K >4, Mag >2  - Recommend adequate hydration. - Cardiac monitor to be placed today. - Consider neurology consult due to unilaterality of sxs.   # Hypertension # Hyperlipidemia Patient is without chest pain. EKG without acute ischemic changes, troponins negative x 2. - Continue aspirin  81 mg daily. - Continue home amlodipine  5 mg-benazepril  40 mg daily. - Patient previously took Crestor  10 mg, discontinued in 05/2022. Lipid panel with elevated total cholesterol and LDL.  Resume Crestor  10 mg daily. - Consider outpatient ischemic evaluation due to noted Q waves inferior leads on EKG. Patient is without anginal sxs at this time.   Cardiology will sign off. Please haiku with questions or re-engage if needed. Appointment scheduled with with Dr. Wilburn on Wednesday,  01/31/2024 at 10:45 AM.   This patient's plan of care was discussed and created with Dr. Florencio and he is in agreement.  Signed: Danita Bloch, PA-C  01/10/2024, 9:31 AM [facility] Cardiology

## 2024-01-10 NOTE — TOC Initial Note (Signed)
 Transition of Care [facility]) - Initial/Assessment Note    Patient Details  Name: Vincent Cole MRN: [id number] Date of Birth: 1951/03/12  Transition of Care [facility]) CM/SW Contact:    Dalia GORMAN Fuse, RN Phone Number: 01/10/2024, 10:44 AM  Clinical Narrative:                 Patient is from home, No TOC needs at this time. Please outreach if needs are identified.        Patient Goals and CMS Choice            Expected Discharge Plan and Services                                              Prior Living Arrangements/Services                       Activities of Daily Living   ADL Screening (condition at time of admission) Independently performs ADLs?: Yes (appropriate for developmental age) Is the patient deaf or have difficulty hearing?: No Does the patient have difficulty seeing, even when wearing glasses/contacts?: No Does the patient have difficulty concentrating, remembering, or making decisions?: No  Permission Sought/Granted                  Emotional Assessment              Admission diagnosis:  Left leg weakness [R29.898] Near syncope [R55] Syncope, unspecified syncope type [R55] Patient Active Problem List   Diagnosis Date Noted   Near syncope 01/09/2024   Syncope and collapse 01/08/2024   Morbid obesity (HCC) 01/08/2024   Polypharmacy 01/08/2024   Chest discomfort 01/08/2024   History of reverse total replacement of right shoulder joint 07/11/2023   GAD (generalized anxiety disorder) 06/05/2023   Spinal stenosis of lumbar region 06/01/2023   History of total knee arthroplasty 10/11/2022   Low back pain 08/04/2022   Gastroesophageal reflux disease without esophagitis 07/22/2022   Leg pain 06/19/2022   Depression 09/27/2021   BMI 36.0-36.9,adult 11/17/2020   Edema of both lower extremities 02/03/2020   DDD (degenerative disc disease), lumbosacral 06/22/2018   Lumbar spondylosis 06/22/2018   Pain in limb 10/20/2017    Rotator cuff tear, right 08/03/2017   Arthritis 08/02/2017   Insomnia 08/02/2017   OSA (obstructive sleep apnea) 08/02/2017   BNC (bladder neck contracture) 07/31/2017   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 [place] [address] Phone: [phone] Fax: [phone]  [facility] - [place] , [place] - [address] [place]  [place] [address] Phone: [phone] Fax: [phone]  [id number] [place], [place] - [address] [place] [address] Phone: [phone] Fax: [phone]     Social Drivers of Health (SDOH) Social History: SDOH Screenings   Food Insecurity: No Food Insecurity (01/08/2024)  Housing: Low Risk  (01/08/2024)  Transportation Needs: No Transportation Needs (01/08/2024)  Utilities:  Not At Risk (01/08/2024)  Alcohol Screen: Low Risk  (05/22/2023)  Depression (PHQ2-9): High Risk (05/22/2023)  Social Connections: Moderately Isolated (01/08/2024)  Stress: No Stress Concern Present (05/22/2023)  Tobacco Use: Low Risk  (01/08/2024)  Health Literacy: Adequate Health Literacy (05/22/2023)   SDOH Interventions:     Readmission Risk Interventions     No data to display

## 2024-01-10 NOTE — Progress Notes (Signed)
 Eeg done

## 2024-01-10 NOTE — Consult Note (Addendum)
 NEUROLOGY CONSULT NOTE   Date of service: January 10, 2024 Patient Name: Vincent Cole MRN:  [id number] DOB:  [birth year] Chief Complaint: Syncope, left-sided weakness Requesting Provider: Jerelene Critchley, MD  History of Present Illness  Vincent Cole is a 73 y.o. male with hx of hypertension, hyperlipidemia,Concern for ongoing memory issues although her last MMSE in 2022 was 29/30, presents for evaluation of ongoing syncopal episodes with left-sided weakness. He describes that over the past couple of weeks, he has started having the symptoms that come without warning where he feels his left leg is giving out and he is about to become imbalanced.  On Sunday, he had an episode where he passed out, losing consciousness for short duration of time.  No preceding aura.  No history of convulsions.  No prior similar episodes. He said that with couple of these episodes in the past few days, he is also had a cold sensation that involves his left leg and thorax as well as both his eyelids become somewhat heavier. He denies any frank headache.  Noted some sensory findings over the left chest but denies any frank chest pain.  Reports having had higher blood pressures in the past few days compared to his norm. No recent medication changes Takes gabapentin  and tramadol for chronic back pain and knee pain   ROS  Comprehensive ROS performed and pertinent positives documented in HPI   Past History   Past Medical History:  Diagnosis Date   Abdominal pain 01/17/2011   Formatting of this note might be different from the original.  Note: Unchanged - chronic  suprapubic/lower abdominal area since TURP in 2005; urology has now diagnosed, after extensive evaluation, interstitial cystitis; has also had repeat TURP in June 2010, and a surgery on the neck of the bladder in July of 2011     Actinic keratosis    Arthritis    Benign bladder mass    Complete tear of left rotator cuff 04/19/2017   Complication of  anesthesia    PONV x 1 in 2010   Deep venous thrombosis (HCC) 12/30/2023   Depression    Frequency of urination    GERD (gastroesophageal reflux disease) no meds   Heart murmur    was told many years ago   History of DVT of lower extremity 02/03/2020   History of recurrent UTIs    Hypertension    IC (interstitial cystitis)    Nocturia    OSA on CPAP nightly   Pelvic pain in male    PONV (postoperative nausea and vomiting)    Simple renal cyst    Urgency of urination    Urinary hesitancy     Past Surgical History:  Procedure Laterality Date   BUNIONECTOMY Right 01/15/2021   Procedure: ZELL HAI;  Surgeon: Ashley Soulier, DPM;  Location: [facility];  Service: Podiatry;  Laterality: Right;   COLONOSCOPY     COLONOSCOPY WITH PROPOFOL  N/A 06/25/2021   Procedure: COLONOSCOPY WITH PROPOFOL ;  Surgeon: Maryruth Ole DASEN, MD;  Location: [facility];  Service: Endoscopy;  Laterality: N/A;   CORRECTION OVERLAPPING TOES Right 01/15/2021   Procedure: CORRECTION OVERLAPPING TOES;  Surgeon: Ashley Soulier, DPM;  Location: [facility];  Service: Podiatry;  Laterality: Right;   CYSTO WITH HYDRODISTENSION  06/02/2011   Procedure: CYSTOSCOPY/HYDRODISTENSION;  Surgeon: Glendia DELENA Elizabeth, MD;  Location: [facility];  Service: Urology;  Laterality: N/A;  catheter placement   HAMMER TOE SURGERY Right 01/15/2021   Procedure: HAMMER TOE CORRECTION;  Surgeon: Ashley,  Eva, DPM;  Location: [facility];  Service: Podiatry;  Laterality: Right;   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   JOINT REPLACEMENT     shoulder, left knee   KNEE ARTHROSCOPY     TRANSURETHRAL RESECTION OF PROSTATE  X3   LAST ONE MAY 2012   WEIL OSTEOTOMY Right 01/15/2021   Procedure: WEIL;  Surgeon: Ashley Eva, DPM;  Location: [facility];  Service: Podiatry;  Laterality: Right;    Family History: Family History  Problem Relation Age of Onset   Heart disease Mother     Social History  reports  that he has never smoked. He has never used smokeless tobacco. He reports current alcohol use. He reports that he does not use drugs.  No Known Allergies  Medications   Current Facility-Administered Medications:    acetaminophen  (TYLENOL ) tablet 650 mg, 650 mg, Oral, Q6H PRN, 650 mg at 01/09/24 0818 **OR** acetaminophen  (TYLENOL ) suppository 650 mg, 650 mg, Rectal, Q6H PRN, Cox, Amy N, DO   amLODipine  (NORVASC ) tablet 5 mg, 5 mg, Oral, Daily, 5 mg at 01/10/24 0909 **AND** benazepril  (LOTENSIN ) tablet 40 mg, 40 mg, Oral, Daily, Cox, Amy N, DO, 40 mg at 01/10/24 0910   aspirin  EC tablet 81 mg, 81 mg, Oral, Daily, Cox, Amy N, DO, 81 mg at 01/10/24 0909   DULoxetine  (CYMBALTA ) DR capsule 60 mg, 60 mg, Oral, Daily, Cox, Amy N, DO, 60 mg at 01/10/24 0909   gabapentin  (NEURONTIN ) capsule 800 mg, 800 mg, Oral, BID, Cox, Amy N, DO, 800 mg at 01/10/24 0909   hydrALAZINE  (APRESOLINE ) injection 5 mg, 5 mg, Intravenous, Q6H PRN, Cox, Amy N, DO   melatonin tablet 5 mg, 5 mg, Oral, QHS PRN, Cox, Amy N, DO, 5 mg at 01/09/24 2349   ondansetron  (ZOFRAN ) tablet 4 mg, 4 mg, Oral, Q6H PRN **OR** ondansetron  (ZOFRAN ) injection 4 mg, 4 mg, Intravenous, Q6H PRN, Cox, Amy N, DO   rosuvastatin  (CRESTOR ) tablet 10 mg, 10 mg, Oral, Daily, Ponnala, Shruthi, MD, 10 mg at 01/10/24 0909   senna-docusate (Senokot-S) tablet 1 tablet, 1 tablet, Oral, QHS PRN, Cox, Amy N, DO   sodium chloride  flush (NS) 0.9 % injection 3 mL, 3 mL, Intravenous, Q12H, Cox, Amy N, DO   traZODone  (DESYREL ) tablet 50 mg, 50 mg, Oral, QHS PRN, Cox, Amy N, DO, 50 mg at 01/09/24 2349  Vitals   Vitals:   01/10/24 0033 01/10/24 0415 01/10/24 0500 01/10/24 0825  BP: 126/78 119/83  129/81  Pulse: 75 70  62  Resp: 16 16  18   Temp: (!) 97.5 F (36.4 C) 97.6 F (36.4 C)  (!) 97.4 F (36.3 C)  TempSrc: Oral Oral    SpO2: 98% 96%  95%  Weight:   95.4 kg   Height:        Body mass index is 36.1 kg/m.   Physical Exam  GENERAL: Awake, alert in  NAD HEENT: - Normocephalic and atraumatic, dry mm, no LN++, no Thyromegally LUNGS - Clear to auscultation bilaterally with no wheezes CV - S1S2 RRR, no m/r/g, equal pulses bilaterally. ABDOMEN - Soft, nontender, nondistended with normoactive BS NEURO:  Mental Status: AA&Ox3 Speech and Language: speech is clear.  Naming, repetition, fluency, and comprehension intact. Cranial Nerves: PERRL. EOMI, visual fields full, no facial asymmetry, facial sensation intact, hearing intact, tongue/uvula/soft palate midline, normal sternocleidomastoid and trapezius muscle strength. No evidence of tongue atrophy or fibrillations Motor: 5/5 in all 4s Tone: is normal and bulk is normal Sensation-  Intact to light touch bilaterally Coordination: FTN intact bilaterally, no ataxia in BLE. Gait- deferred  Labs/Imaging/Neurodiagnostic studies   CBC:  Recent Labs  Lab 02-07-24 1450 01/09/24 0406  WBC 6.0 5.5  HGB 14.8 13.5  HCT 43.5 41.1  MCV 87.2 88.8  PLT 373 299   Basic Metabolic Panel:  Lab Results  Component Value Date   NA 133 (L) 01/10/2024   K 3.8 01/10/2024   CO2 21 (L) 01/10/2024   GLUCOSE 90 01/10/2024   BUN 17 01/10/2024   CREATININE 0.73 01/10/2024   CALCIUM  8.8 (L) 01/10/2024   GFRNONAA >60 01/10/2024   GFRAA >60 11/23/2019   Lipid Panel:  Lab Results  Component Value Date   LDLCALC 140 (H) 01/10/2024   HgbA1c:  Lab Results  Component Value Date   HGBA1C 5.2 06/06/2023   Urine Drug Screen:     Component Value Date/Time   LABOPIA NONE DETECTED 02-06-21 2328   COCAINSCRNUR NONE DETECTED 02/06/2021 2328   LABBENZ NONE DETECTED 2021/02/06 2328   AMPHETMU NONE DETECTED 02/06/21 2328   THCU POSITIVE (A) Feb 06, 2021 2328   LABBARB NONE DETECTED 2021/02/06 2328    Alcohol Level     Component Value Date/Time   ETH 64 (H) Feb 06, 2021 2328    CT Head without contrast(Personally reviewed): No acute issues. CT C-sp: multilevel DJD  MRI Brain(Personally reviewed): No  acute process  Neurodiagnostics rEEG:  Ordered  Other diagnostics: 2D echo: LVEF 55 to 60%, no regional wall motion abnormalities, grade 1 diastolic dysfunction, RV systolic function is low normal, right ventricle mildly enlarged, LA size normal  ASSESSMENT   Vincent Cole is a 73 y.o. male past history of hyperlipidemia, hypertension presenting for evaluation of multiple days of syncopal and near syncopal episode with at least 1 episode where he lost consciousness.  He describes some sensory changes noted on the left side of his body with 1 of these episodes and also reports some eyelid heaviness. Reports some chest paresthesias but no frank chest pain. EKG had some concerning findings-nonspecific ST-T wave changes and Q waves in the inferior and lateral leads for which cardiology is following him and recommend outpatient ischemic workup. Etiology of his episodes is not very clear at this time.  Echocardiogram is unremarkable to provide any clear explanation. I would recommend obtaining CT angiogram head and neck to evaluate for vertebrobasilar insufficiency as well as an EEG to look for any electrographic abnormalities which might explain the symptoms, and if those tests are unremarkable, he will need continued outpatient workup. Other differentials to consider are medication side effects with tramadol and gabapentin , which should be minimized and used at the lowest effective dose  RECOMMENDATIONS  Would recommend proper medicine reconciliation to ensure he is not taking medicine in excess of what is known or prescribed especially medications like tramadol and gabapentin . Routine EEG CT angiography head and neck If all of the above is unremarkable, continue outpatient follow-up-established patient of [facility] neurology and should continue to follow there. Will update if EEG or CTA head and neck reveal any abnormalities.  Plan relayed to Dr.  Jerelene  ______________________________________________________________________    Signed, Eligio Lav, MD [facility]

## 2024-01-10 NOTE — Plan of Care (Signed)

## 2024-01-10 NOTE — Progress Notes (Signed)
 Progress Note    Vincent Cole  FMW:[id number] DOB: 31-Aug-1950  DOA: 01/08/2024 PCP: Fernand Fredy RAMAN, MD      Brief Narrative:   Vincent Cole is a 73 y.o. male with history of hypertension, hyperlipidemia, GERD, history of DVT, history of left knee arthroplasty, who presented to the hospital with general weakness and near syncope.  Patient said he went to the store on the day of admission and shortly after he felt as if his knees were about to give out and felt like he was going to pass out.  This was associated with dizziness.  No chest pain.  His wife said this has happened about 4 times in a period of 2 weeks.  First episode occurred about 2 weeks ago when patient had just gotten out of the bathroom and he felt weak in his knees, felt dizzy and fell on the floor (fell on his hands).  Timing and details are patchy.   Chart review shows that patient has seen neurologist at [facility] on 04/24/2023 for memory loss, unsteady gait and heaviness in bilateral legs   Assessment/Plan:   Principal Problem:   Syncope and collapse Active Problems:   Chest discomfort   OSA (obstructive sleep apnea)   Hyperlipidemia   Morbid obesity (HCC)   Benign prostatic hyperplasia   Insomnia   GAD (generalized anxiety disorder)   HTN (hypertension)   Depression   Polypharmacy   Near syncope    Body mass index is 36.1 kg/m.   Recurrent Syncope:  - Syncope associated left sided leg pain/weakness, left sided facial weakness and left eyelid heaviness per patient  - No orthostatic hypotension.  CT head and MRI brain did not show any acute abnormality.   - Labs were unremarkable - Echo shows EF 55 to 60%.  No RWMA.  Grade 1 diastolic dysfunction.  - Seen by cardiology, appreciate recs. Cardiac monitor placed. outpatient ischemic evaluation  - Seen by Neurology, appreciate recs.  - EEG, CTA head and neck pending - Will discuss possible polypharmacy or medication changes - Follow up  outpatient with Neurology [facility]   History of left knee replacement: However, patient says that the left knee does not cause any pain.  He said he was able to ambulate to the bathroom unassisted this morning.   Hypertension: Continue amlodipine  and benazepril .  Hyperlipidemia: discontinued Crestor  10mg  05/2022, LDL 140 - resume Crestor    Comorbidities include depression, class II obesity, OSA on CPAP, insomnia  PT eval pending    Diet Order             Diet Heart Room service appropriate? Yes; Fluid consistency: Thin  Diet effective now                   Consultants: Cardiology Neurology  Procedures: None    Medications:    amLODipine   5 mg Oral Daily   And   benazepril   40 mg Oral Daily   aspirin  EC  81 mg Oral Daily   DULoxetine   60 mg Oral Daily   gabapentin   800 mg Oral BID   sodium chloride  flush  3 mL Intravenous Q12H   Continuous Infusions:   Anti-infectives (From admission, onward)    None              Family Communication/Anticipated D/C date and plan/Code Status   DVT prophylaxis: Place TED hose Start: 01/08/24 1916     Code Status: Full Code  Family Communication:  Plan discussed with patient at the bedside Disposition Plan: Plan to discharge home   Status is: Inpatient Remains inpatient appropriate because: Workup for recurrent near syncope/syncope       Subjective:   Interval events noted.  No complaints.  No chest pain, shortness of breath, dizziness or palpitations. Pending EEG, CTA head and neck PT eval  Objective:    Vitals:   01/10/24 0033 01/10/24 0415 01/10/24 0500 01/10/24 0825  BP: 126/78 119/83  129/81  Pulse: 75 70  62  Resp: 16 16  18   Temp: (!) 97.5 F (36.4 C) 97.6 F (36.4 C)  (!) 97.4 F (36.3 C)  TempSrc: Oral Oral    SpO2: 98% 96%  95%  Weight:   95.4 kg   Height:       Orthostatic VS for the past 24 hrs:  BP- Lying Pulse- Lying BP- Sitting Pulse- Sitting BP- Standing at 0  minutes Pulse- Standing at 0 minutes  01/09/24 1030 126/83 67 (!) 131/91 69 (!) 138/92 77     Intake/Output Summary (Last 24 hours) at 01/10/2024 0851 Last data filed at 01/09/2024 1900 Gross per 24 hour  Intake 720 ml  Output --  Net 720 ml   Filed Weights   01/08/24 1447 01/09/24 0500 01/10/24 0500  Weight: 93.9 kg 96.3 kg 95.4 kg    Exam:  GEN: NAD SKIN: Warm and dry EYES: No pallor or icterus ENT: MMM CV: RRR PULM: CTA B ABD: soft, obese, NT, +BS CNS: AAO x 3, non focal EXT: No edema or tenderness        Data Reviewed:   I have personally reviewed following labs and imaging studies:  Labs: Labs show the following:   Basic Metabolic Panel: Recent Labs  Lab 01/08/24 1450 01/09/24 0406 01/10/24 0610  NA 136 134* 133*  K 4.0 3.9 3.8  CL 102 102 103  CO2 25 25 21*  GLUCOSE 104* 91 90  BUN 19 22 17   CREATININE 1.10 0.90 0.73  CALCIUM  9.2 8.9 8.8*   GFR Estimated Creatinine Clearance: 84.4 mL/min (by C-G formula based on SCr of 0.73 mg/dL). Liver Function Tests: Recent Labs  Lab 01/08/24 1450  AST 28  ALT 23  ALKPHOS 88  BILITOT 0.5  PROT 7.4  ALBUMIN 4.1   No results for input(s): LIPASE, AMYLASE in the last 168 hours. No results for input(s): AMMONIA in the last 168 hours. Coagulation profile No results for input(s): INR, PROTIME in the last 168 hours.  CBC: Recent Labs  Lab 01/08/24 1450 01/09/24 0406  WBC 6.0 5.5  HGB 14.8 13.5  HCT 43.5 41.1  MCV 87.2 88.8  PLT 373 299   Cardiac Enzymes: No results for input(s): CKTOTAL, CKMB, CKMBINDEX, TROPONINI in the last 168 hours. BNP (last 3 results) No results for input(s): PROBNP in the last 8760 hours. CBG: Recent Labs  Lab 01/08/24 1958 01/10/24 0701  GLUCAP 98 95   D-Dimer: No results for input(s): DDIMER in the last 72 hours. Hgb A1c: No results for input(s): HGBA1C in the last 72 hours. Lipid Profile: Recent Labs    01/10/24 0610  CHOL 213*   HDL 60  LDLCALC 140*  TRIG 64  CHOLHDL 3.6   Thyroid function studies: No results for input(s): TSH, T4TOTAL, T3FREE, THYROIDAB in the last 72 hours.  Invalid input(s): FREET3 Anemia work up: No results for input(s): VITAMINB12, FOLATE, FERRITIN, TIBC, IRON, RETICCTPCT in the last 72 hours. Sepsis Labs: Recent Labs  Lab 01/08/24  1450 01/09/24 0406  WBC 6.0 5.5    Microbiology No results found for this or any previous visit (from the past 240 hours).  Procedures and diagnostic studies:  ECHOCARDIOGRAM COMPLETE Result Date: 01/10/2024    ECHOCARDIOGRAM REPORT   Patient Name:   DEADRICK STIDD Date of Exam: 01/09/2024 Medical Rec #:  [id number]      Height:       64.0 in Accession #:    [id number]     Weight:       212.3 lb Date of Birth:  06-05-1950      BSA:          2.007 m Patient Age:    73 years       BP:           125/77 mmHg Patient Gender: M              HR:           74 bpm. Exam Location:  [facility] Procedure: 2D Echo, Cardiac Doppler and Color Doppler (Both Spectral and Color            Flow Doppler were utilized during procedure). Indications:     Syncope R55  History:         Patient has no prior history of Echocardiogram examinations.  Sonographer:     Ashley McNeely-Sloane Referring Phys:  [id number] DORENE COMFORT Diagnosing Phys: Dwayne D Callwood MD IMPRESSIONS  1. Left ventricular ejection fraction, by estimation, is 55 to 60%. The left ventricle has normal function. The left ventricle has no regional wall motion abnormalities. Left ventricular diastolic parameters are consistent with Grade I diastolic dysfunction (impaired relaxation).  2. Right ventricular systolic function is low normal. The right ventricular size is mildly enlarged.  3. The mitral valve is normal in structure. No evidence of mitral valve regurgitation.  4. The aortic valve is normal in structure. Aortic valve regurgitation is not visualized. Conclusion(s)/Recommendation(s): Poor windows  for evaluation of left ventricular function by transthoracic echocardiography. Would recommend an alternative means of evaluation. FINDINGS  Left Ventricle: Left ventricular ejection fraction, by estimation, is 55 to 60%. The left ventricle has normal function. The left ventricle has no regional wall motion abnormalities. Strain was performed and the global longitudinal strain is indeterminate. The left ventricular internal cavity size was normal in size. There is no left ventricular hypertrophy. Left ventricular diastolic parameters are consistent with Grade I diastolic dysfunction (impaired relaxation). Right Ventricle: The right ventricular size is mildly enlarged. No increase in right ventricular wall thickness. Right ventricular systolic function is low normal. Left Atrium: Left atrial size was normal in size. Right Atrium: Right atrial size was normal in size. Pericardium: There is no evidence of pericardial effusion. Mitral Valve: The mitral valve is normal in structure. No evidence of mitral valve regurgitation. MV peak gradient, 3.5 mmHg. The mean mitral valve gradient is 1.0 mmHg. Tricuspid Valve: The tricuspid valve is normal in structure. Tricuspid valve regurgitation is trivial. Aortic Valve: The aortic valve is normal in structure. Aortic valve regurgitation is not visualized. Aortic valve mean gradient measures 3.0 mmHg. Aortic valve peak gradient measures 5.5 mmHg. Aortic valve area, by VTI measures 3.06 cm. Pulmonic Valve: The pulmonic valve was normal in structure. Pulmonic valve regurgitation is trivial. Aorta: The ascending aorta was not well visualized. IAS/Shunts: No atrial level shunt detected by color flow Doppler. Additional Comments: 3D was performed not requiring image post processing on an independent workstation and was indeterminate.  LEFT  VENTRICLE PLAX 2D LVIDd:         4.10 cm     Diastology LVIDs:         2.70 cm     LV e' medial:    5.44 cm/s LV PW:         1.00 cm     LV E/e'  medial:  9.0 LV IVS:        1.00 cm     LV e' lateral:   10.10 cm/s LVOT diam:     2.20 cm     LV E/e' lateral: 4.8 LV SV:         70 LV SV Index:   35 LVOT Area:     3.80 cm  LV Volumes (MOD) LV vol d, MOD A2C: 88.0 ml LV vol d, MOD A4C: 70.3 ml LV vol s, MOD A2C: 46.1 ml LV vol s, MOD A4C: 30.7 ml LV SV MOD A2C:     41.9 ml LV SV MOD A4C:     70.3 ml LV SV MOD BP:      41.2 ml RIGHT VENTRICLE RV Basal diam:  4.20 cm RV Mid diam:    4.00 cm TAPSE (M-mode): 2.7 cm LEFT ATRIUM             Index        RIGHT ATRIUM           Index LA diam:        3.60 cm 1.79 cm/m   RA Area:     19.10 cm LA Vol (A2C):   54.6 ml 27.21 ml/m  RA Volume:   52.60 ml  26.21 ml/m LA Vol (A4C):   31.6 ml 15.75 ml/m LA Biplane Vol: 41.5 ml 20.68 ml/m  AORTIC VALVE                    PULMONIC VALVE AV Area (Vmax):    3.17 cm     PV Vmax:        1.48 m/s AV Area (Vmean):   3.06 cm     PV Vmean:       98.400 cm/s AV Area (VTI):     3.06 cm     PV VTI:         0.258 m AV Vmax:           117.00 cm/s  PV Peak grad:   8.8 mmHg AV Vmean:          80.100 cm/s  PV Mean grad:   5.0 mmHg AV VTI:            0.227 m      RVOT Peak grad: 4 mmHg AV Peak Grad:      5.5 mmHg AV Mean Grad:      3.0 mmHg LVOT Vmax:         97.50 cm/s LVOT Vmean:        64.500 cm/s LVOT VTI:          0.183 m LVOT/AV VTI ratio: 0.81  AORTA Ao Root diam: 3.50 cm Ao Asc diam:  3.50 cm MITRAL VALVE MV Area (PHT): 2.99 cm    SHUNTS MV Area VTI:   3.15 cm    Systemic VTI:  0.18 m MV Peak grad:  3.5 mmHg    Systemic Diam: 2.20 cm MV Mean grad:  1.0 mmHg    Pulmonic VTI:  0.176 m MV Vmax:       0.93 m/s  MV Vmean:      55.2 cm/s MV Decel Time: 254 msec MV E velocity: 48.70 cm/s MV A velocity: 94.90 cm/s MV E/A ratio:  0.51 Dwayne JONETTA Lovelace MD Electronically signed by Cara JONETTA Lovelace MD Signature Date/Time: 01/10/2024/7:42:55 AM    Final    MR BRAIN WO CONTRAST Result Date: 01/09/2024 CLINICAL DATA:  Neuro deficit, acute, stroke suspected. EXAM: MRI HEAD WITHOUT CONTRAST  TECHNIQUE: Multiplanar, multiecho pulse sequences of the brain and surrounding structures were obtained without intravenous contrast. COMPARISON:  CT head 01/08/2024. MRI head 05/10/2023. FINDINGS: Brain: No acute infarction, hemorrhage, hydrocephalus, extra-axial collection or mass lesion. Mildly frontal predominant cerebral atrophy. Mild for age small T2/FLAIR hyperintensities the white matter, compatible with mild chronic microvascular ischemic disease. Vascular: Normal flow voids. Skull and upper cervical spine: Normal marrow signal. Sinuses/Orbits: Clear sinuses.  No acute orbital findings. Other: No mastoid effusions. IMPRESSION: No evidence of acute intracranial abnormality. Electronically Signed   By: Gilmore GORMAN Molt M.D.   On: 01/09/2024 03:17   CT Head Wo Contrast Result Date: 01/08/2024 CLINICAL DATA:  Neuro deficit, acute, stroke suspected; Neck trauma (Age >= 65y) EXAM: CT HEAD WITHOUT CONTRAST CT CERVICAL SPINE WITHOUT CONTRAST TECHNIQUE: Multidetector CT imaging of the head and cervical spine was performed following the standard protocol without intravenous contrast. Multiplanar CT image reconstructions of the cervical spine were also generated. RADIATION DOSE REDUCTION: This exam was performed according to the departmental dose-optimization program which includes automated exposure control, adjustment of the mA and/or kV according to patient size and/or use of iterative reconstruction technique. COMPARISON:  None Available. FINDINGS: CT HEAD FINDINGS Brain: No evidence of acute infarction, hemorrhage, hydrocephalus, extra-axial collection or mass lesion/mass effect. Cerebral atrophy. Vascular: No hyperdense vessel. Skull: No acute fracture. Sinuses/Orbits: Clear sinuses.  No acute orbital findings. Other: No mastoid effusions. CT CERVICAL SPINE FINDINGS Alignment: Mild facet mediated, degenerative anterolisthesis of C3 on C4 and C4 on C5. Otherwise, no substantial sagittal subluxation. Skull  base and vertebrae: No evidence of acute fracture. Soft tissues and spinal canal: No prevertebral fluid or swelling. No visible canal hematoma. Disc levels: Multilevel facet arthropathy, greatest and severe on the right at C4-C5 and the left at C3-C4. When combined with uncovertebral hypertrophy there is resulting varying degrees of neural foraminal stenosis. Upper chest: Lung apices are clear. IMPRESSION: 1. No evidence of acute intracranial abnormality. No evidence of acute fracture or traumatic malalignment in the cervical spine. 2. Multilevel degenerative change, including severe facet arthropathy on the left at C3-C4 and right at C4-C5. An MRI could better assess the canal and foramina if clinically warranted. Electronically Signed   By: Gilmore GORMAN Molt M.D.   On: 01/08/2024 18:01   CT Cervical Spine Wo Contrast Result Date: 01/08/2024 CLINICAL DATA:  Neuro deficit, acute, stroke suspected; Neck trauma (Age >= 65y) EXAM: CT HEAD WITHOUT CONTRAST CT CERVICAL SPINE WITHOUT CONTRAST TECHNIQUE: Multidetector CT imaging of the head and cervical spine was performed following the standard protocol without intravenous contrast. Multiplanar CT image reconstructions of the cervical spine were also generated. RADIATION DOSE REDUCTION: This exam was performed according to the departmental dose-optimization program which includes automated exposure control, adjustment of the mA and/or kV according to patient size and/or use of iterative reconstruction technique. COMPARISON:  None Available. FINDINGS: CT HEAD FINDINGS Brain: No evidence of acute infarction, hemorrhage, hydrocephalus, extra-axial collection or mass lesion/mass effect. Cerebral atrophy. Vascular: No hyperdense vessel. Skull: No acute fracture. Sinuses/Orbits: Clear sinuses.  No acute orbital findings. Other:  No mastoid effusions. CT CERVICAL SPINE FINDINGS Alignment: Mild facet mediated, degenerative anterolisthesis of C3 on C4 and C4 on C5. Otherwise, no  substantial sagittal subluxation. Skull base and vertebrae: No evidence of acute fracture. Soft tissues and spinal canal: No prevertebral fluid or swelling. No visible canal hematoma. Disc levels: Multilevel facet arthropathy, greatest and severe on the right at C4-C5 and the left at C3-C4. When combined with uncovertebral hypertrophy there is resulting varying degrees of neural foraminal stenosis. Upper chest: Lung apices are clear. IMPRESSION: 1. No evidence of acute intracranial abnormality. No evidence of acute fracture or traumatic malalignment in the cervical spine. 2. Multilevel degenerative change, including severe facet arthropathy on the left at C3-C4 and right at C4-C5. An MRI could better assess the canal and foramina if clinically warranted. Electronically Signed   By: Gilmore GORMAN Molt M.D.   On: 01/08/2024 18:01   DG Chest 2 View Result Date: 01/08/2024 CLINICAL DATA:  Chest pain, progressive weakness for 1 week, dizziness EXAM: CHEST - 2 VIEW COMPARISON:  11/24/2019 FINDINGS: Frontal and lateral views of the chest demonstrate a stable cardiac silhouette. No acute airspace disease, effusion, or pneumothorax. No acute bony abnormalities. Unremarkable bilateral shoulder arthroplasties. IMPRESSION: 1. No acute intrathoracic process. Electronically Signed   By: Ozell Daring M.D.   On: 01/08/2024 17:53       LOS: 1 day   Collins Dimaria  [facility] [employer] loss adjuster on [email or web address]. If 7PM-7AM, please contact night-coverage at [email or web address]     01/10/2024, 8:51 AM

## 2024-01-10 NOTE — Care Management Obs Status (Signed)
 MEDICARE OBSERVATION STATUS NOTIFICATION   Patient Details  Name: Vincent Cole MRN: [id number] Date of Birth: [birth year]   Medicare Observation Status Notification Given:  Chaney BRANDY CHRISTIANE LELON, CMA 01/10/2024, 11:00 AM

## 2024-01-11 DIAGNOSIS — R55 Syncope and collapse: Secondary | ICD-10-CM | POA: Diagnosis not present

## 2024-01-11 LAB — BASIC METABOLIC PANEL WITH GFR
Anion gap: 8 (ref 5–15)
BUN: 15 mg/dL (ref 8–23)
CO2: 23 mmol/L (ref 22–32)
Calcium: 9 mg/dL (ref 8.9–10.3)
Chloride: 102 mmol/L (ref 98–111)
Creatinine, Ser: 0.68 mg/dL (ref 0.61–1.24)
GFR, Estimated: >60 mL/min (ref >60)
Glucose, Bld: 89 mg/dL (ref 70–99)
Potassium: 3.8 mmol/L (ref 3.5–5.1)
Sodium: 133 mmol/L — ABNORMAL LOW (ref 135–145)

## 2024-01-11 LAB — GLUCOSE, CAPILLARY: Glucose-Capillary: 105 mg/dL — ABNORMAL HIGH (ref 70–99)

## 2024-01-11 MED ORDER — ROSUVASTATIN CALCIUM 10 MG PO TABS: 10.0000 mg | ORAL_TABLET | Freq: Every day | ORAL | 1 refills | Status: AC

## 2024-01-11 MED ORDER — ACETAMINOPHEN 325 MG PO TABS: 650.0000 mg | ORAL_TABLET | Freq: Four times a day (QID) | ORAL | 0 refills | Status: AC | PRN

## 2024-01-11 NOTE — Progress Notes (Signed)
 NEUROLOGY CONSULT FOLLOW UP NOTE   Date of service: January 11, 2024 Patient Name: Vincent Cole MRN:  [id number] DOB:  [birth year]  Interval Hx/subjective   Seen and examined. No acute events overnight   Vitals:   01/10/24 2127 01/11/24 0002 01/11/24 0547 01/11/24 0921  BP: 124/81 127/77 123/88 (!) 142/88  Pulse: 76 66 63 72  Resp: 16 16 16 19   Temp: 98 F (36.7 C) 98.1 F (36.7 C) 97.8 F (36.6 C) 98.4 F (36.9 C)  TempSrc:      SpO2: 95% 95% 94% 96%  Weight:      Height:         Body mass index is 36.1 kg/m.  Physical Exam   GENERAL: Awake, alert in NAD HEENT: - Normocephalic and atraumatic, dry mm, no LN++, no Thyromegally LUNGS - Clear to auscultation bilaterally with no wheezes CV - S1S2 RRR, no m/r/g, equal pulses bilaterally. ABDOMEN - Soft, nontender, nondistended with normoactive BS NEURO:  Mental Status: AA&Ox3 Speech and Language: speech is clear.  Naming, repetition, fluency, and comprehension intact. Cranial Nerves: PERRL. EOMI, visual fields full, no facial asymmetry, facial sensation intact, hearing intact, tongue/uvula/soft palate midline, normal sternocleidomastoid and trapezius muscle strength. No evidence of tongue atrophy or fibrillations Motor: 5/5 in all 4s Tone: is normal and bulk is normal Sensation- Intact to light touch bilaterally Coordination: FTN intact bilaterally, no ataxia in BLE. Gait- deferred  Unchanged and normal exam with some pain in lower extremities which is chronic  Medications  Current Facility-Administered Medications:    acetaminophen  (TYLENOL ) tablet 650 mg, 650 mg, Oral, Q6H PRN, 650 mg at 01/09/24 0818 **OR** acetaminophen  (TYLENOL ) suppository 650 mg, 650 mg, Rectal, Q6H PRN, Cox, Amy N, DO   amLODipine  (NORVASC ) tablet 5 mg, 5 mg, Oral, Daily, 5 mg at 01/11/24 0846 **AND** benazepril  (LOTENSIN ) tablet 40 mg, 40 mg, Oral, Daily, Cox, Amy N, DO, 40 mg at 01/11/24 0846   aspirin  EC tablet 81 mg, 81 mg, Oral, Daily,  Cox, Amy N, DO, 81 mg at 01/11/24 0846   DULoxetine  (CYMBALTA ) DR capsule 60 mg, 60 mg, Oral, Daily, Cox, Amy N, DO, 60 mg at 01/11/24 9153   gabapentin  (NEURONTIN ) capsule 800 mg, 800 mg, Oral, BID, Cox, Amy N, DO, 800 mg at 01/11/24 9153   hydrALAZINE  (APRESOLINE ) injection 5 mg, 5 mg, Intravenous, Q6H PRN, Cox, Amy N, DO   melatonin tablet 5 mg, 5 mg, Oral, QHS PRN, Cox, Amy N, DO, 5 mg at 01/10/24 2221   ondansetron  (ZOFRAN ) tablet 4 mg, 4 mg, Oral, Q6H PRN **OR** ondansetron  (ZOFRAN ) injection 4 mg, 4 mg, Intravenous, Q6H PRN, Cox, Amy N, DO   rosuvastatin  (CRESTOR ) tablet 10 mg, 10 mg, Oral, Daily, Ponnala, Shruthi, MD, 10 mg at 01/11/24 0846   senna-docusate (Senokot-S) tablet 1 tablet, 1 tablet, Oral, QHS PRN, Cox, Amy N, DO   sodium chloride  flush (NS) 0.9 % injection 3 mL, 3 mL, Intravenous, Q12H, Cox, Amy N, DO, 3 mL at 01/10/24 2216   traZODone  (DESYREL ) tablet 50 mg, 50 mg, Oral, QHS PRN, Cox, Amy N, DO, 50 mg at 01/10/24 2221  Labs and Diagnostic Imaging   CBC:  Recent Labs  Lab 01/08/24 1450 01/09/24 0406  WBC 6.0 5.5  HGB 14.8 13.5  HCT 43.5 41.1  MCV 87.2 88.8  PLT 373 299    Basic Metabolic Panel:  Lab Results  Component Value Date   NA 133 (L) 01/11/2024   K 3.8 01/11/2024   CO2  23 01/11/2024   GLUCOSE 89 01/11/2024   BUN 15 01/11/2024   CREATININE 0.68 01/11/2024   CALCIUM  9.0 01/11/2024   GFRNONAA >60 01/11/2024   GFRAA >60 11/23/2019   Lipid Panel:  Lab Results  Component Value Date   LDLCALC 140 (H) 01/10/2024   HgbA1c:  Lab Results  Component Value Date   HGBA1C 5.2 06/06/2023   Urine Drug Screen:     Component Value Date/Time   LABOPIA NONE DETECTED 01/07/2021 2328   COCAINSCRNUR NONE DETECTED 01/07/2021 2328   LABBENZ NONE DETECTED 01/07/2021 2328   AMPHETMU NONE DETECTED 01/07/2021 2328   THCU POSITIVE (A) 01/07/2021 2328   LABBARB NONE DETECTED 01/07/2021 2328    Alcohol Level     Component Value Date/Time   ETH 64 (H)  01/07/2021 2328   Imaging personally reviewed CT Head without contrast No acute issues. CT C-sp: multilevel DJD CT angiography head and neck: No acute findings.  No evidence of vertebrobasilar narrowing   MRI Brain  No acute process   Neurodiagnostics rEEG: Normal   Other diagnostics: 2D echo: LVEF 55 to 60%, no regional wall motion abnormalities, grade 1 diastolic dysfunction, RV systolic function is low normal, right ventricle mildly enlarged, LA size normal  Assessment   Daivd Fredericksen is a 73 y.o. male past history of hyperlipidemia, hypertension presenting for evaluation of multiple events of syncopal and near syncopal episodes with at least 1 episode where he lost consciousness along with left-sided knee pain and left leg giving out and eyelid heaviness. Noted some chest paresthesias but no frank chest pain.  Cardiology on board for some new findings on the EKG but does not need any acute intervention inpatient. CT head, CT C-spine, CT angiography of the head and neck, MRI of the brain and routine EEG are all unrevealing of any kind of etiology of syncope or change in consciousness.  Echocardiogram also is nonrevealing. At this time, I do not have a very good explanation for his syncope.  I would still continue to recommend minimizing sedating medications, which is a good practice for any patient past 74 years of age.  Recommendations  No further inpatient workup from a neurological standpoint Follow-up with outpatient neurology in 2 to 3 months-established patient of [facility] clinic neurology. Follow-up with cardiology as advised. Plan relayed to Dr.Ponnala  ______________________________________________________________________   Signed, Eligio Lav, MD [facility]

## 2024-01-11 NOTE — Evaluation (Signed)
 Physical Therapy Evaluation Patient Details Name: Rodert Hinch MRN: [id number] DOB: 12/23/1950 Today's Date: 01/11/2024  History of Present Illness  Mr. Alvino Lechuga is a 73 year old male with history of hypertension, hyperlipidemia, GERD, history of DVT, who presents ED for chief concerns of weakness, near syncope and chest pain for about 1 week. He reports an odd sensation in L LE and it goes weak and then gives out on him.   Clinical Impression  Patient received in bed, he is pleasant and agrees to PT assessment. Patient is independent with bed mobility and transfers. He ambulated 175 feet with RW and supervision. No symptoms of weakness or syncope during mobility this session. Patient does not require skilled PT at this time. Encouraged to continue using rolling walker upon return home and follow up with medical if symptoms persist. Signing off.         If plan is discharge home, recommend the following: Assist for transportation   Can travel by private vehicle    yes    Equipment Recommendations None recommended by PT  Recommendations for Other Services       Functional Status Assessment Patient has not had a recent decline in their functional status     Precautions / Restrictions Precautions Precautions: Fall Recall of Precautions/Restrictions: Intact Restrictions Weight Bearing Restrictions Per Provider Order: No      Mobility  Bed Mobility Overal bed mobility: Modified Independent                  Transfers Overall transfer level: Modified independent Equipment used: None                    Ambulation/Gait Ambulation/Gait assistance: Supervision Gait Distance (Feet): 175 Feet Assistive device: Rolling walker (2 wheels) Gait Pattern/deviations: Step-through pattern Gait velocity: WFL     General Gait Details: slight limp that is chronic due to scoliosis and patient normally wears lift in shoe. No numbness or weakness in L LE walking with  me.  Stairs            Wheelchair Mobility     Tilt Bed    Modified Rankin (Stroke Patients Only)       Balance Overall balance assessment: Modified Independent                                           Pertinent Vitals/Pain Pain Assessment Pain Assessment: No/denies pain    Home Living Family/patient expects to be discharged to:: Private residence Living Arrangements: Alone   Type of Home: Apartment Home Access: Level entry       Home Layout: One level Home Equipment: Agricultural consultant (2 wheels)      Prior Function Prior Level of Function : Independent/Modified Independent;Driving             Mobility Comments: does not use AD at baseline. ADLs Comments: independent     Extremity/Trunk Assessment   Upper Extremity Assessment Upper Extremity Assessment: Overall WFL for tasks assessed    Lower Extremity Assessment Lower Extremity Assessment: Overall WFL for tasks assessed    Cervical / Trunk Assessment Cervical / Trunk Assessment: Normal  Communication   Communication Communication: No apparent difficulties    Cognition Arousal: Alert Behavior During Therapy: WFL for tasks assessed/performed   PT - Cognitive impairments: No apparent impairments  Following commands: Intact       Cueing Cueing Techniques: Verbal cues     General Comments      Exercises     Assessment/Plan    PT Assessment Patient does not need any further PT services  PT Problem List         PT Treatment Interventions      PT Goals (Current goals can be found in the Care Plan section)  Acute Rehab PT Goals Patient Stated Goal: return home PT Goal Formulation: With patient Time For Goal Achievement: 01/13/24 Potential to Achieve Goals: Good    Frequency       Co-evaluation               AM-PAC PT 6 Clicks Mobility  Outcome Measure Help needed turning from your back to your side while in a  flat bed without using bedrails?: None Help needed moving from lying on your back to sitting on the side of a flat bed without using bedrails?: None Help needed moving to and from a bed to a chair (including a wheelchair)?: None Help needed standing up from a chair using your arms (e.g., wheelchair or bedside chair)?: None Help needed to walk in hospital room?: None Help needed climbing 3-5 steps with a railing? : None 6 Click Score: 24    End of Session   Activity Tolerance: Patient tolerated treatment well Patient left: in bed;with call bell/phone within reach Nurse Communication: Mobility status      Time: 0915-0930 PT Time Calculation (min) (ACUTE ONLY): 15 min   Charges:   PT Evaluation $PT Eval Low Complexity: 1 Low   PT General Charges $$ ACUTE PT VISIT: 1 Visit         Telisha Zawadzki, PT, [facility] 01/11/24,9:42 AM

## 2024-01-11 NOTE — Plan of Care (Signed)
  Problem: Cardiac: Goal: Will achieve and/or maintain adequate cardiac output Outcome: Progressing   Problem: Physical Regulation: Goal: Complications related to the disease process, condition or treatment will be avoided or minimized Outcome: Progressing   Problem: Education: Goal: Knowledge of General Education information will improve Description: Including pain rating scale, medication(s)/side effects and non-pharmacologic comfort measures Outcome: Progressing   Problem: Clinical Measurements: Goal: Ability to maintain clinical measurements within normal limits will improve Outcome: Progressing Goal: Will remain free from infection Outcome: Progressing Goal: Respiratory complications will improve Outcome: Progressing Goal: Cardiovascular complication will be avoided Outcome: Progressing

## 2024-01-11 NOTE — Discharge Summary (Signed)
 Physician Discharge Summary   Patient: Vincent Cole MRN: [id number] DOB: 01-Oct-1950  Admit date:     01/08/2024  Discharge date: 01/11/24  Discharge Physician: Laree Lock   PCP: Fernand Fredy RAMAN, MD   Recommendations at discharge:   Follow up with PCP in 2 weeks - Repeat BMP, CBC Cardiac monitor placed  Follow up with Neurology outpatient in 2-3 months Follow up with Cardiology, appointment will be made  Discharge Diagnoses: Principal Problem:   Syncope and collapse Active Problems:   Chest discomfort   OSA (obstructive sleep apnea)   Hyperlipidemia   Morbid obesity (HCC)   Benign prostatic hyperplasia   Insomnia   GAD (generalized anxiety disorder)   HTN (hypertension)   Depression   Polypharmacy   Near syncope  Resolved Problems:   Near syncope  Hospital Course: Vincent Cole is a 73 y.o. male with history of hypertension, hyperlipidemia, GERD, history of DVT, history of left knee arthroplasty, who presented to the hospital with general weakness and near syncope.  His wife said this has happened about 4 times in a period of 2 weeks.  First episode occurred about 2 weeks ago when patient had just gotten out of the bathroom and he felt weak in his knees, felt dizzy and fell on the floor (fell on his hands). Hospital course as below  Recurrent Syncope - No orthostatic hypotension.  CT head, CTA head and neck and MRI brain did not show any acute abnormality.   - EEG no abnormality - Labs were unremarkable - Echo shows EF 55 to 60%.  No RWMA.  Grade 1 diastolic dysfunction - Seen by cardiology, Cardiac monitor placed. outpatient ischemic evaluation for EKG changes - Discussed possible polypharmacy contributing to syncope - reports starting Tramadol 2 weeks ago, will discontinue - Follow up outpatient with Neurology [facility] clinic   History of left knee replacement   Hypertension: Continue amlodipine  and benazepril .   Hyperlipidemia: discontinued Crestor  10mg   05/2022, LDL 140 - resume Crestor      Comorbidities include depression, class II obesity, OSA on CPAP, insomnia   PT eval no needs   Consultants: Cardiology, Neurology Disposition: Home Diet recommendation:  Discharge Diet Orders (From admission, onward)     Start     Ordered   01/11/24 0000  Diet - low sodium heart healthy        01/11/24 1420            DISCHARGE MEDICATION: Allergies as of 01/11/2024   No Known Allergies      Medication List     STOP taking these medications    HYDROcodone -acetaminophen  7.5-325 MG tablet Commonly known as: NORCO   traMADol 50 MG tablet Commonly known as: ULTRAM       TAKE these medications    acetaminophen  325 MG tablet Commonly known as: TYLENOL  Take 2 tablets (650 mg total) by mouth every 6 (six) hours as needed for mild pain (pain score 1-3) or fever (or Fever >/= 101).   amLODipine -benazepril  5-40 MG capsule Commonly known as: LOTREL TAKE 1 CAPSULE BY MOUTH EVERY DAY   aspirin  EC 81 MG tablet Take 81 mg by mouth daily. Swallow whole.   Azelastine HCl 137 MCG/SPRAY Soln USE 2 SPRAYS IN EACH NOSTRIL AT NIGHT   busPIRone  10 MG tablet Commonly known as: BUSPAR  TAKE 1 TABLET BY MOUTH THREE TIMES A DAY AS NEEDED   DULoxetine  60 MG capsule Commonly known as: CYMBALTA  Take 1 capsule (60 mg total) by mouth daily.  fluticasone 50 MCG/ACT nasal spray Commonly known as: FLONASE Place 1-2 sprays into both nostrils at bedtime.   gabapentin  800 MG tablet Commonly known as: NEURONTIN  Take 800 mg by mouth 2 (two) times daily. What changed: Another medication with the same name was removed. Continue taking this medication, and follow the directions you see here.   hydrocortisone  2.5 % cream Apply topically to aa of face t- thur- sat nightly   hydrOXYzine  25 MG tablet Commonly known as: ATARAX  Take 50 mg by mouth every evening.   ketoconazole  2 % cream Commonly known as: NIZORAL  Apply to affected areas on face  at bedtime on M W Fri   meloxicam  15 MG tablet Commonly known as: MOBIC  Take 15 mg by mouth in the morning.   mupirocin  ointment 2 % Commonly known as: BACTROBAN  Apply 1 Application topically daily.   OMEGA 3 PO Take 1 capsule by mouth once a week.   rosuvastatin  10 MG tablet Commonly known as: CRESTOR  Take 1 tablet (10 mg total) by mouth daily. Start taking on: January 12, 2024   tiZANidine 4 MG tablet Commonly known as: ZANAFLEX Take 4 mg by mouth at bedtime.   torsemide  10 MG tablet Commonly known as: DEMADEX  TAKE 1 TABLET BY MOUTH EVERY DAY   traZODone  50 MG tablet Commonly known as: DESYREL  Take 50 mg by mouth at bedtime as needed.        Follow-up Information     Fernand Fredy RAMAN, MD Follow up.   Specialty: Internal Medicine Why: hospital follow up Contact information: [address] [place] [address] [phone]         Wilburn Keller BROCKS, MD. Go in 3 week(s).   Specialty: Cardiology Why: Appointment scheduled with Dr. Wilburn on Wednesday, 01/31/2024 at 10:45 AM for hospital, Holter follow-up Contact information: [address] [place] [address] [phone]                Discharge Exam: Fredricka Weights   01/08/24 1447 01/09/24 0500 01/10/24 0500  Weight: 93.9 kg 96.3 kg 95.4 kg   GEN: NAD SKIN: Warm and dry EYES: No pallor or icterus ENT: MMM CV: RRR PULM: CTA B ABD: soft, obese, NT, +BS CNS: AAO x 3, non focal EXT: No edema or tenderness  Condition at discharge: good  The results of significant diagnostics from this hospitalization (including imaging, microbiology, ancillary and laboratory) are listed below for reference.   Imaging Studies: CT ANGIO HEAD W OR WO CONTRAST Result Date: 01/10/2024 CLINICAL DATA:  Syncope/presyncope, cerebrovascular cause suspected EXAM: CT ANGIOGRAPHY HEAD TECHNIQUE: Multidetector CT imaging of the head was performed using the standard protocol during bolus administration of  intravenous contrast. Multiplanar CT image reconstructions and MIPs were obtained to evaluate the vascular anatomy. RADIATION DOSE REDUCTION: This exam was performed according to the departmental dose-optimization program which includes automated exposure control, adjustment of the mA and/or kV according to patient size and/or use of iterative reconstruction technique. CONTRAST:  75mL OMNIPAQUE  IOHEXOL  350 MG/ML SOLN COMPARISON:  MRI head 01/09/2024. FINDINGS: CT HEAD Brain: No evidence of acute infarction, hemorrhage, hydrocephalus, extra-axial collection or mass lesion/mass effect. Vascular: See below. Skull: No acute fracture. Sinuses: Clear sinuses. CTA HEAD Anterior circulation: Bilateral intracranial ICAs, MCAs, and ACAs are patent without proximal hemodynamically significant stenosis. Posterior circulation: Bilateral intradural vertebral arteries, basilar artery and bilateral posterior cerebral arteries are patent without proximal hemodynamically significant stenosis. Venous sinuses: As permitted by contrast timing, patent. Review of the MIP images confirms the  above findings. IMPRESSION: 1. No evidence of acute intracranial abnormality. 2. No large vessel occlusion or proximal hemodynamically significant stenosis. Electronically Signed   By: Gilmore GORMAN Molt M.D.   On: 01/10/2024 19:28   EEG adult Result Date: 01/10/2024 Shelton Arlin KIDD, MD     01/10/2024  1:49 PM Patient Name: Saagar Tortorella MRN: [id number] Epilepsy Attending: Arlin KIDD Shelton Referring Physician/Provider: Voncile Isles, MD Date: 01/10/2024 Duration: 39.53 mins Patient history: 73 y.o. male presenting for evaluation of multiple days of syncopal and near syncopal episode with at least 1 episode where he lost consciousness. EEG to evaluate for seizure Level of alertness: Awake, asleep AEDs during EEG study: GBP Technical aspects: This EEG study was done with scalp electrodes positioned according to the 10-20 International system of  electrode placement. Electrical activity was reviewed with band pass filter of 1-70Hz , sensitivity of 7 uV/mm, display speed of 77mm/sec with a 60Hz  notched filter applied as appropriate. EEG data were recorded continuously and digitally stored.  Video monitoring was available and reviewed as appropriate. Description: The posterior dominant rhythm consists of 9-10 Hz activity of moderate voltage (25-35 uV) seen predominantly in posterior head regions, symmetric and reactive to eye opening and eye closing. Sleep was characterized by vertex waves, sleep spindles (12 to 14 Hz), maximal frontocentral region. Hyperventilation and photic stimulation were not performed.   IMPRESSION: This study is within normal limits. No seizures or epileptiform discharges were seen throughout the recording. A normal interictal EEG does not exclude the diagnosis of epilepsy. Arlin KIDD Shelton   ECHOCARDIOGRAM COMPLETE Result Date: 01/10/2024    ECHOCARDIOGRAM REPORT   Patient Name:   GARNIE BORCHARDT Date of Exam: 01/09/2024 Medical Rec #:  [id number]      Height:       64.0 in Accession #:    [id number]     Weight:       212.3 lb Date of Birth:  [birth year]      BSA:          2.007 m Patient Age:    73 years       BP:           125/77 mmHg Patient Gender: M              HR:           74 bpm. Exam Location:  [facility] Procedure: 2D Echo, Cardiac Doppler and Color Doppler (Both Spectral and Color            Flow Doppler were utilized during procedure). Indications:     Syncope R55  History:         Patient has no prior history of Echocardiogram examinations.  Sonographer:     Ashley McNeely-Sloane Referring Phys:  [id number] DORENE COMFORT Diagnosing Phys: Dwayne D Callwood MD IMPRESSIONS  1. Left ventricular ejection fraction, by estimation, is 55 to 60%. The left ventricle has normal function. The left ventricle has no regional wall motion abnormalities. Left ventricular diastolic parameters are consistent with Grade I diastolic dysfunction  (impaired relaxation).  2. Right ventricular systolic function is low normal. The right ventricular size is mildly enlarged.  3. The mitral valve is normal in structure. No evidence of mitral valve regurgitation.  4. The aortic valve is normal in structure. Aortic valve regurgitation is not visualized. Conclusion(s)/Recommendation(s): Poor windows for evaluation of left ventricular function by transthoracic echocardiography. Would recommend an alternative means of evaluation. FINDINGS  Left Ventricle: Left ventricular ejection fraction, by estimation, is 55  to 60%. The left ventricle has normal function. The left ventricle has no regional wall motion abnormalities. Strain was performed and the global longitudinal strain is indeterminate. The left ventricular internal cavity size was normal in size. There is no left ventricular hypertrophy. Left ventricular diastolic parameters are consistent with Grade I diastolic dysfunction (impaired relaxation). Right Ventricle: The right ventricular size is mildly enlarged. No increase in right ventricular wall thickness. Right ventricular systolic function is low normal. Left Atrium: Left atrial size was normal in size. Right Atrium: Right atrial size was normal in size. Pericardium: There is no evidence of pericardial effusion. Mitral Valve: The mitral valve is normal in structure. No evidence of mitral valve regurgitation. MV peak gradient, 3.5 mmHg. The mean mitral valve gradient is 1.0 mmHg. Tricuspid Valve: The tricuspid valve is normal in structure. Tricuspid valve regurgitation is trivial. Aortic Valve: The aortic valve is normal in structure. Aortic valve regurgitation is not visualized. Aortic valve mean gradient measures 3.0 mmHg. Aortic valve peak gradient measures 5.5 mmHg. Aortic valve area, by VTI measures 3.06 cm. Pulmonic Valve: The pulmonic valve was normal in structure. Pulmonic valve regurgitation is trivial. Aorta: The ascending aorta was not well  visualized. IAS/Shunts: No atrial level shunt detected by color flow Doppler. Additional Comments: 3D was performed not requiring image post processing on an independent workstation and was indeterminate.  LEFT VENTRICLE PLAX 2D LVIDd:         4.10 cm     Diastology LVIDs:         2.70 cm     LV e' medial:    5.44 cm/s LV PW:         1.00 cm     LV E/e' medial:  9.0 LV IVS:        1.00 cm     LV e' lateral:   10.10 cm/s LVOT diam:     2.20 cm     LV E/e' lateral: 4.8 LV SV:         70 LV SV Index:   35 LVOT Area:     3.80 cm  LV Volumes (MOD) LV vol d, MOD A2C: 88.0 ml LV vol d, MOD A4C: 70.3 ml LV vol s, MOD A2C: 46.1 ml LV vol s, MOD A4C: 30.7 ml LV SV MOD A2C:     41.9 ml LV SV MOD A4C:     70.3 ml LV SV MOD BP:      41.2 ml RIGHT VENTRICLE RV Basal diam:  4.20 cm RV Mid diam:    4.00 cm TAPSE (M-mode): 2.7 cm LEFT ATRIUM             Index        RIGHT ATRIUM           Index LA diam:        3.60 cm 1.79 cm/m   RA Area:     19.10 cm LA Vol (A2C):   54.6 ml 27.21 ml/m  RA Volume:   52.60 ml  26.21 ml/m LA Vol (A4C):   31.6 ml 15.75 ml/m LA Biplane Vol: 41.5 ml 20.68 ml/m  AORTIC VALVE                    PULMONIC VALVE AV Area (Vmax):    3.17 cm     PV Vmax:        1.48 m/s AV Area (Vmean):   3.06 cm     PV Vmean:  98.400 cm/s AV Area (VTI):     3.06 cm     PV VTI:         0.258 m AV Vmax:           117.00 cm/s  PV Peak grad:   8.8 mmHg AV Vmean:          80.100 cm/s  PV Mean grad:   5.0 mmHg AV VTI:            0.227 m      RVOT Peak grad: 4 mmHg AV Peak Grad:      5.5 mmHg AV Mean Grad:      3.0 mmHg LVOT Vmax:         97.50 cm/s LVOT Vmean:        64.500 cm/s LVOT VTI:          0.183 m LVOT/AV VTI ratio: 0.81  AORTA Ao Root diam: 3.50 cm Ao Asc diam:  3.50 cm MITRAL VALVE MV Area (PHT): 2.99 cm    SHUNTS MV Area VTI:   3.15 cm    Systemic VTI:  0.18 m MV Peak grad:  3.5 mmHg    Systemic Diam: 2.20 cm MV Mean grad:  1.0 mmHg    Pulmonic VTI:  0.176 m MV Vmax:       0.93 m/s MV Vmean:      55.2 cm/s  MV Decel Time: 254 msec MV E velocity: 48.70 cm/s MV A velocity: 94.90 cm/s MV E/A ratio:  0.51 Dwayne D Callwood MD Electronically signed by Cara JONETTA Lovelace MD Signature Date/Time: 01/10/2024/7:42:55 AM    Final    MR BRAIN WO CONTRAST Result Date: 01/09/2024 CLINICAL DATA:  Neuro deficit, acute, stroke suspected. EXAM: MRI HEAD WITHOUT CONTRAST TECHNIQUE: Multiplanar, multiecho pulse sequences of the brain and surrounding structures were obtained without intravenous contrast. COMPARISON:  CT head 01/08/2024. MRI head 05/10/2023. FINDINGS: Brain: No acute infarction, hemorrhage, hydrocephalus, extra-axial collection or mass lesion. Mildly frontal predominant cerebral atrophy. Mild for age small T2/FLAIR hyperintensities the white matter, compatible with mild chronic microvascular ischemic disease. Vascular: Normal flow voids. Skull and upper cervical spine: Normal marrow signal. Sinuses/Orbits: Clear sinuses.  No acute orbital findings. Other: No mastoid effusions. IMPRESSION: No evidence of acute intracranial abnormality. Electronically Signed   By: Gilmore GORMAN Molt M.D.   On: 01/09/2024 03:17   CT Head Wo Contrast Result Date: 01/08/2024 CLINICAL DATA:  Neuro deficit, acute, stroke suspected; Neck trauma (Age >= 65y) EXAM: CT HEAD WITHOUT CONTRAST CT CERVICAL SPINE WITHOUT CONTRAST TECHNIQUE: Multidetector CT imaging of the head and cervical spine was performed following the standard protocol without intravenous contrast. Multiplanar CT image reconstructions of the cervical spine were also generated. RADIATION DOSE REDUCTION: This exam was performed according to the departmental dose-optimization program which includes automated exposure control, adjustment of the mA and/or kV according to patient size and/or use of iterative reconstruction technique. COMPARISON:  None Available. FINDINGS: CT HEAD FINDINGS Brain: No evidence of acute infarction, hemorrhage, hydrocephalus, extra-axial collection or mass  lesion/mass effect. Cerebral atrophy. Vascular: No hyperdense vessel. Skull: No acute fracture. Sinuses/Orbits: Clear sinuses.  No acute orbital findings. Other: No mastoid effusions. CT CERVICAL SPINE FINDINGS Alignment: Mild facet mediated, degenerative anterolisthesis of C3 on C4 and C4 on C5. Otherwise, no substantial sagittal subluxation. Skull base and vertebrae: No evidence of acute fracture. Soft tissues and spinal canal: No prevertebral fluid or swelling. No visible canal hematoma. Disc levels: Multilevel facet arthropathy, greatest and severe  on the right at C4-C5 and the left at C3-C4. When combined with uncovertebral hypertrophy there is resulting varying degrees of neural foraminal stenosis. Upper chest: Lung apices are clear. IMPRESSION: 1. No evidence of acute intracranial abnormality. No evidence of acute fracture or traumatic malalignment in the cervical spine. 2. Multilevel degenerative change, including severe facet arthropathy on the left at C3-C4 and right at C4-C5. An MRI could better assess the canal and foramina if clinically warranted. Electronically Signed   By: Gilmore GORMAN Molt M.D.   On: 01/08/2024 18:01   CT Cervical Spine Wo Contrast Result Date: 01/08/2024 CLINICAL DATA:  Neuro deficit, acute, stroke suspected; Neck trauma (Age >= 65y) EXAM: CT HEAD WITHOUT CONTRAST CT CERVICAL SPINE WITHOUT CONTRAST TECHNIQUE: Multidetector CT imaging of the head and cervical spine was performed following the standard protocol without intravenous contrast. Multiplanar CT image reconstructions of the cervical spine were also generated. RADIATION DOSE REDUCTION: This exam was performed according to the departmental dose-optimization program which includes automated exposure control, adjustment of the mA and/or kV according to patient size and/or use of iterative reconstruction technique. COMPARISON:  None Available. FINDINGS: CT HEAD FINDINGS Brain: No evidence of acute infarction, hemorrhage,  hydrocephalus, extra-axial collection or mass lesion/mass effect. Cerebral atrophy. Vascular: No hyperdense vessel. Skull: No acute fracture. Sinuses/Orbits: Clear sinuses.  No acute orbital findings. Other: No mastoid effusions. CT CERVICAL SPINE FINDINGS Alignment: Mild facet mediated, degenerative anterolisthesis of C3 on C4 and C4 on C5. Otherwise, no substantial sagittal subluxation. Skull base and vertebrae: No evidence of acute fracture. Soft tissues and spinal canal: No prevertebral fluid or swelling. No visible canal hematoma. Disc levels: Multilevel facet arthropathy, greatest and severe on the right at C4-C5 and the left at C3-C4. When combined with uncovertebral hypertrophy there is resulting varying degrees of neural foraminal stenosis. Upper chest: Lung apices are clear. IMPRESSION: 1. No evidence of acute intracranial abnormality. No evidence of acute fracture or traumatic malalignment in the cervical spine. 2. Multilevel degenerative change, including severe facet arthropathy on the left at C3-C4 and right at C4-C5. An MRI could better assess the canal and foramina if clinically warranted. Electronically Signed   By: Gilmore GORMAN Molt M.D.   On: 01/08/2024 18:01   DG Chest 2 View Result Date: 01/08/2024 CLINICAL DATA:  Chest pain, progressive weakness for 1 week, dizziness EXAM: CHEST - 2 VIEW COMPARISON:  11/24/2019 FINDINGS: Frontal and lateral views of the chest demonstrate a stable cardiac silhouette. No acute airspace disease, effusion, or pneumothorax. No acute bony abnormalities. Unremarkable bilateral shoulder arthroplasties. IMPRESSION: 1. No acute intrathoracic process. Electronically Signed   By: Ozell Daring M.D.   On: 01/08/2024 17:53    Microbiology: Results for orders placed or performed in visit on 12/21/23  Treponemal Antibodies, TPPA     Status: None   Collection Time: 12/22/23 10:13 AM  Result Value Ref Range Status   Treponemal Antibodies, TPPA Non Reactive Non  Reactive Final   Interpretation: Comment  Final    Comment: Syphilis: Treponemal Antibodies with Reflex to RPR and RPR           Titer, Reverse Screening and Diagnosis Algorithm ------------------------------------------------------------ Treponemal              Treponemal     Ab       RPR, Qn     Ab, TPPA    Final Interpretation ----------   --------   ----------   ----------------------- Non  N/A        N/A          No laboratory evidence Reactive                             of syphilis. Retest in                                      2-4 weeks if recent                                      exposure is suspected. ----------   --------   ----------   ----------------------- Reactive     Non        Non          Treponemal antibodies              Reactive   Reactive     not confirmed.                                      Inconclusive for                                      syphilis; potential                                      early syphilis,                                      possible false                                       positive. Retest in 2-4                                      weeks if recent                                      exposure is suspected. ----------   --------   ----------   ----------------------- Reactive     Non        Reactive     Treponemal antibodies              Reactive                detected. Consistent                                      with past or current                                      (  potential early)                                      syphilis. ----------   --------   ----------   ----------------------- Reactive     >/=1:1     N/A          Treponemal and                                      nontreponemal                                      antibodies detected.                                      Consistent with current                                      or past syphilis. This test is intended ONLY for specimens that  have tested positive (reactive) or equivocal for Treponema pallidum antibodies prior to submission for testing.  For the full CDC-recommended syphilis screening and diagnosis algorithm, [facility] offers test code 012005 RPR, Rfx Qn RPR/Confirm TP or 917654 T pallidum Screening Cascade.     Labs: CBC: Recent Labs  Lab 01/08/24 1450 01/09/24 0406  WBC 6.0 5.5  HGB 14.8 13.5  HCT 43.5 41.1  MCV 87.2 88.8  PLT 373 299   Basic Metabolic Panel: Recent Labs  Lab 01/08/24 1450 01/09/24 0406 01/10/24 0610 01/11/24 0422  NA 136 134* 133* 133*  K 4.0 3.9 3.8 3.8  CL 102 102 103 102  CO2 25 25 21* 23  GLUCOSE 104* 91 90 89  BUN 19 22 17 15   CREATININE 1.10 0.90 0.73 0.68  CALCIUM  9.2 8.9 8.8* 9.0   Liver Function Tests: Recent Labs  Lab 01/08/24 1450  AST 28  ALT 23  ALKPHOS 88  BILITOT 0.5  PROT 7.4  ALBUMIN 4.1   CBG: Recent Labs  Lab 01/08/24 1958 01/10/24 0701 01/11/24 0540  GLUCAP 98 95 105*    Discharge time spent: greater than 30 minutes.  Signed: Laree Lock, MD [facility] 01/11/2024

## 2024-01-15 ENCOUNTER — Encounter: Payer: Self-pay | Admitting: Internal Medicine

## 2024-01-15 ENCOUNTER — Ambulatory Visit (INDEPENDENT_AMBULATORY_CARE_PROVIDER_SITE_OTHER): Admitting: Internal Medicine

## 2024-01-15 ENCOUNTER — Other Ambulatory Visit

## 2024-01-15 VITALS — BP 126/86 | HR 81 | Ht 64.5 in | Wt 212.4 lb

## 2024-01-15 DIAGNOSIS — F411 Generalized anxiety disorder: Secondary | ICD-10-CM

## 2024-01-15 DIAGNOSIS — G4733 Obstructive sleep apnea (adult) (pediatric): Secondary | ICD-10-CM | POA: Diagnosis not present

## 2024-01-15 DIAGNOSIS — I10 Essential (primary) hypertension: Secondary | ICD-10-CM

## 2024-01-15 DIAGNOSIS — M1990 Unspecified osteoarthritis, unspecified site: Secondary | ICD-10-CM

## 2024-01-15 DIAGNOSIS — M1712 Unilateral primary osteoarthritis, left knee: Secondary | ICD-10-CM

## 2024-01-15 DIAGNOSIS — Z87898 Personal history of other specified conditions: Secondary | ICD-10-CM | POA: Insufficient documentation

## 2024-01-15 NOTE — Progress Notes (Signed)
 Established Patient Office Visit  Subjective:  Patient ID: Vincent Cole, male    DOB: 19-Mar-1951  Age: 73 y.o. MRN: [id number]  Chief Complaint  Patient presents with   Hospitalization Follow-up    Patient is here today for follow up since being discharged from the hospital. He was admitted fro 8/18- 01/11/24, for syncopal episodes.  He was supposed to have his Neurology appointment last week but when he arrived to that appointment with concerns for TIA they sent him to the emergency room and then admitted. He was discharged on 01/11/24. CT of brain, CT of head, EEG and MRI all came back normal. He will reschedule that appointment with Neurology to determine what is causing his syncope episodes. He is currently on day 4/5 of his cardiac monitor. He was also supposed to see Rheumatology for his positive ANA but that appointment was missed due to his hospitalization; he will call and have that rescheduled as well. He believes the episodes are related to his Left knee pain. He had it replaced in 2016 but reports frequent cold sensation running up lateral aspect of his thigh, and then his knee gives out while walking.    No other concerns at this time.   Past Medical History:  Diagnosis Date   Abdominal pain 01/17/2011   Formatting of this note might be different from the original.  Note: Unchanged - chronic  suprapubic/lower abdominal area since TURP in 2005; urology has now diagnosed, after extensive evaluation, interstitial cystitis; has also had repeat TURP in June 2010, and a surgery on the neck of the bladder in July of 2011     Actinic keratosis    Arthritis    Benign bladder mass    Complete tear of left rotator cuff 04/19/2017   Complication of anesthesia    PONV x 1 in 2010   Deep venous thrombosis (HCC) 12/30/2023   Depression    Frequency of urination    GERD (gastroesophageal reflux disease) no meds   Heart murmur    was told many years ago   History of DVT of lower  extremity 02/03/2020   History of recurrent UTIs    Hypertension    IC (interstitial cystitis)    Nocturia    OSA on CPAP nightly   Pelvic pain in male    PONV (postoperative nausea and vomiting)    Simple renal cyst    Urgency of urination    Urinary hesitancy     Past Surgical History:  Procedure Laterality Date   BUNIONECTOMY Right 01/15/2021   Procedure: ZELL HAI;  Surgeon: Ashley Soulier, DPM;  Location: [facility];  Service: Podiatry;  Laterality: Right;   COLONOSCOPY     COLONOSCOPY WITH PROPOFOL  N/A 06/25/2021   Procedure: COLONOSCOPY WITH PROPOFOL ;  Surgeon: Maryruth Ole DASEN, MD;  Location: [facility];  Service: Endoscopy;  Laterality: N/A;   CORRECTION OVERLAPPING TOES Right 01/15/2021   Procedure: CORRECTION OVERLAPPING TOES;  Surgeon: Ashley Soulier, DPM;  Location: [facility];  Service: Podiatry;  Laterality: Right;   CYSTO WITH HYDRODISTENSION  06/02/2011   Procedure: CYSTOSCOPY/HYDRODISTENSION;  Surgeon: Glendia DELENA Elizabeth, MD;  Location: [facility];  Service: Urology;  Laterality: N/A;  catheter placement   HAMMER TOE SURGERY Right 01/15/2021   Procedure: HAMMER TOE CORRECTION;  Surgeon: Ashley Soulier, DPM;  Location: [facility];  Service: Podiatry;  Laterality: Right;   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   JOINT REPLACEMENT     shoulder, left knee  KNEE ARTHROSCOPY     TRANSURETHRAL RESECTION OF PROSTATE  X3   LAST ONE MAY 2012   WEIL OSTEOTOMY Right 01/15/2021   Procedure: WEIL;  Surgeon: Ashley Soulier, DPM;  Location: [facility];  Service: Podiatry;  Laterality: Right;    Social History   Socioeconomic History   Marital status: Married    Spouse name: Not on file   Number of children: 1   Years of education: Not on file   Highest education level: Not on file  Occupational History   Not on file  Tobacco Use   Smoking status: Never   Smokeless tobacco: Never  Vaping Use   Vaping status: Never Used  Substance  and Sexual Activity   Alcohol use: Yes    Comment: occ   Drug use: No   Sexual activity: Not on file  Other Topics Concern   Not on file  Social History Narrative   Not on file   Social Drivers of Health   Financial Resource Strain: Not on file  Food Insecurity: No Food Insecurity (01/08/2024)   Hunger Vital Sign    Worried About Running Out of Food in the Last Year: Never true    Ran Out of Food in the Last Year: Never true  Transportation Needs: No Transportation Needs (01/08/2024)   PRAPARE - Administrator, Civil Service (Medical): No    Lack of Transportation (Non-Medical): No  Physical Activity: Not on file  Stress: No Stress Concern Present (05/22/2023)   [employer] of Occupational Health - Occupational Stress Questionnaire    Feeling of Stress : Only a little  Social Connections: Moderately Isolated (01/08/2024)   Social Connection and Isolation Panel    Frequency of Communication with Friends and Family: More than three times a week    Frequency of Social Gatherings with Friends and Family: More than three times a week    Attends Religious Services: Never    Database administrator or Organizations: No    Attends Banker Meetings: Never    Marital Status: Married  Catering manager Violence: Not At Risk (01/08/2024)   Humiliation, Afraid, Rape, and Kick questionnaire    Fear of Current or Ex-Partner: No    Emotionally Abused: No    Physically Abused: No    Sexually Abused: No    Family History  Problem Relation Age of Onset   Heart disease Mother     No Known Allergies  Outpatient Medications Prior to Visit  Medication Sig   acetaminophen  (TYLENOL ) 325 MG tablet Take 2 tablets (650 mg total) by mouth every 6 (six) hours as needed for mild pain (pain score 1-3) or fever (or Fever >/= 101).   amLODipine -benazepril  (LOTREL) 5-40 MG capsule TAKE 1 CAPSULE BY MOUTH EVERY DAY   aspirin  EC 81 MG tablet Take 81 mg by mouth daily. Swallow  whole.   busPIRone  (BUSPAR ) 10 MG tablet TAKE 1 TABLET BY MOUTH THREE TIMES A DAY AS NEEDED   DULoxetine  (CYMBALTA ) 60 MG capsule Take 1 capsule (60 mg total) by mouth daily.   gabapentin  (NEURONTIN ) 800 MG tablet Take 800 mg by mouth 2 (two) times daily.   hydrocortisone  2.5 % cream Apply topically to aa of face t- thur- sat nightly   hydrOXYzine  (ATARAX ) 25 MG tablet Take 50 mg by mouth every evening.   ketoconazole  (NIZORAL ) 2 % cream Apply to affected areas on face at bedtime on M W Fri   meloxicam  (MOBIC ) 15 MG  tablet Take 15 mg by mouth in the morning.   rosuvastatin  (CRESTOR ) 10 MG tablet Take 1 tablet (10 mg total) by mouth daily.   torsemide  (DEMADEX ) 10 MG tablet TAKE 1 TABLET BY MOUTH EVERY DAY   traZODone  (DESYREL ) 50 MG tablet Take 50 mg by mouth at bedtime as needed.   fluticasone (FLONASE) 50 MCG/ACT nasal spray Place 1-2 sprays into both nostrils at bedtime. (Patient not taking: Reported on 01/15/2024)   Omega-3 Fatty Acids (OMEGA 3 PO) Take 1 capsule by mouth once a week. (Patient not taking: Reported on 01/15/2024)   [DISCONTINUED] Azelastine HCl 137 MCG/SPRAY SOLN USE 2 SPRAYS IN EACH NOSTRIL AT NIGHT (Patient not taking: Reported on 01/15/2024)   [DISCONTINUED] mupirocin  ointment (BACTROBAN ) 2 % Apply 1 Application topically daily. (Patient not taking: Reported on 01/15/2024)   [DISCONTINUED] tiZANidine (ZANAFLEX) 4 MG tablet Take 4 mg by mouth at bedtime. (Patient not taking: Reported on 01/15/2024)   No facility-administered medications prior to visit.    Review of Systems  Constitutional:  Positive for malaise/fatigue. Negative for chills, diaphoresis, fever and weight loss.  HENT: Negative.    Eyes: Negative.   Respiratory: Negative.  Negative for cough and shortness of breath.   Cardiovascular: Negative.  Negative for chest pain, palpitations and leg swelling.  Gastrointestinal: Negative.  Negative for abdominal pain, constipation, diarrhea, heartburn, nausea and  vomiting.  Genitourinary: Negative.  Negative for dysuria and flank pain.  Musculoskeletal:  Positive for joint pain (L knee, seeing orthopedics). Negative for myalgias.  Skin: Negative.   Neurological:  Positive for sensory change. Negative for dizziness, tingling, tremors, focal weakness, seizures and headaches.  Endo/Heme/Allergies: Negative.   Psychiatric/Behavioral: Negative.  Negative for depression and suicidal ideas. The patient is not nervous/anxious.        Objective:   BP 126/86   Pulse 81   Ht 5' 4.5 (1.638 m)   Wt 212 lb 6.4 oz (96.3 kg)   SpO2 96%   BMI 35.90 kg/m   Vitals:   01/15/24 1329  BP: 126/86  Pulse: 81  Height: 5' 4.5 (1.638 m)  Weight: 212 lb 6.4 oz (96.3 kg)  SpO2: 96%  BMI (Calculated): 35.91    Physical Exam Vitals and nursing note reviewed.  Constitutional:      Appearance: Normal appearance.  HENT:     Head: Normocephalic and atraumatic.     Nose: Nose normal.     Mouth/Throat:     Mouth: Mucous membranes are moist.     Pharynx: Oropharynx is clear.  Eyes:     Conjunctiva/sclera: Conjunctivae normal.     Pupils: Pupils are equal, round, and reactive to light.  Cardiovascular:     Rate and Rhythm: Normal rate and regular rhythm.     Pulses: Normal pulses.     Heart sounds: Normal heart sounds.  Pulmonary:     Effort: Pulmonary effort is normal.     Breath sounds: Normal breath sounds.  Abdominal:     General: Bowel sounds are normal.     Palpations: Abdomen is soft.  Musculoskeletal:        General: Normal range of motion.     Cervical back: Normal range of motion.  Skin:    General: Skin is warm and dry.  Neurological:     General: No focal deficit present.     Mental Status: He is alert and oriented to person, place, and time.  Psychiatric:        Mood and Affect:  Mood normal.        Behavior: Behavior normal.        Judgment: Judgment normal.      No results found for any visits on 01/15/24.  Recent Results  (from the past 2160 hours)  Potassium     Status: None   Collection Time: 11/21/23  9:56 AM  Result Value Ref Range   Potassium 4.6 3.5 - 5.2 mmol/L  GeneConnect Molecular Screen - Blood ([facility])     Status: None   Collection Time: 12/19/23  3:45 PM  Result Value Ref Range   Genetic Analysis Overall Interpretation Negative    Genetic Disease Assessed      This is a screening test and does not detect all pathogenic or likely pathogenic variant(s) in the tested genes; diagnostic testing is recommended for individuals with a personal or family history of heart disease or hereditary cancer. [facility] Tier One  Population Screen is a screening test that analyzes 11 genes related to hereditary breast and ovarian cancer (HBOC) syndrome, Lynch syndrome, and familial hypercholesterolemia. This test only reports clinically significant pathogenic and likely  pathogenic variants but does not report variants of uncertain significance (VUS). In addition, analysis of the PMS2 gene excludes exons 11-15, which overlap with a known pseudogene (PMS2CL).    Genetic Analysis Report      No pathogenic or likely pathogenic variants were detected in the genes analyzed by this test.Genetic test results should be interpreted in the context of an individual's personal medical and family history. Alteration to medical management is NOT  recommended based solely on this result. Clinical correlation is advised.Additional Considerations- This is a screening test; individuals may still carry pathogenic or likely pathogenic variant(s) in the tested genes that are not detected by this test.-  For individuals at risk for these or other related conditions based on factors including personal or family history, diagnostic testing is recommended.- The absence of pathogenic or likely pathogenic variant(s) in the analyzed genes, while reassuring,  does not eliminate the possibility of a hereditary condition; there are other  variants and genes associated with heart disease and hereditary cancer that are not included in this test.    Genes Tested See Notes     Comment: APOB, BRCA1, BRCA2, EPCAM, LDLR, LDLRAP1, PCSK9, PMS2, MLH1, MSH2, MSH6   Disclaimer See Notes     Comment: This test was developed and validated by [facility] This test has not been cleared or approved by the [place]  Food and Drug Administration (FDA). The [facility] laboratory is accredited by the College of American Pathologists (CAP) and certified under  the Clinical Laboratory Improvement Amendments (CLIA #: [id number]) to perform high-complexity clinical tests. This test is used for clinical purposes. It should not be regarded as investigational or for research.    Sequencing Location See Notes     Comment: Sequencing done at [facility] [address], [address], [place] [address] (CLIA# [id number])   Interpretation Methods and Limitations See Notes     Comment: Extracted DNA is enriched for targeted regions and then sequenced using the [facility] Exome+ (R) assay on an Illumina DNA sequencing system. Data is then aligned to a modified version of GRCh38 and all genes are analyzed using the MANE transcript and MANE  Plus Clinical transcript, when available. Small variant calling is completed using a customized version of [employer]'s DNAseq software, augmented by a proprietary small variant caller for difficult variants. Copy number variants (CNVs) are  then called  using a proprietary bioinformatics pipeline based on depth analysis with a comparison to similarly sequenced samples. Analysis of the PMS2 gene is limited to exons 1-10. The interpretation and reporting of variants in APOB, PCSK9, and LDLR is specific to  familial hypercholesterolemia; variants associated with hypobetalipoproteinemia are not included. Interpretation is based upon guidelines published by the [employer] of [employer] and Gen[employer]), the Association  for Mol ecular Pathology  (AMP) or their modification by [employer] Panels when available and/or review of previous clinical assertions available in the [employer]. Interpretation is limited to the transcripts indicated on the report and +/- 10 bp into  intronic regions, except as noted below. [facility] variant classifications include pathogenic, likely pathogenic, variant of uncertain significance (VUS), likely benign, and benign. Only variants classified as pathogenic and likely pathogenic are included in  the report. All reported variants are confirmed through secondary manual inspection of DNA sequence data or orthogonal testing. Risk estimations and management guidelines included in this report are based on analysis of primary literature and  recommendations of applicable professional societies, and should be regarded as approximations.Based on validation studies, this assay delivers > 99% sensitivity and specificity for single nucleotide variants and insertions  and deletions (indels) up to  20 bp. Larger indels and complex variants are also reported but sensitivity may be reduced. Based on validation studies, this assay delivers > 99% sensitivity to multi-exon CNVs and > 90% sensitivity to single-exon CNVs. This test may not detect variants  in challenging regions (such as short tandem repeats, homopolymer runs, and segment duplications), sub-exonic CNVs, chromosomal aneuploidy, or variants in the presence of mosaicism. Phasing will be attempted and reported, when possible. Structural  rearrangements such as inversions, translocations, and gene conversions are not tested in this assay unless explicitly indicated. Additionally, deep intronic, promoter, and enhancer regions may not be covered. It is important to note that this is a  screening test and cannot detect all disease-causing variants. A negative result does not guarantee the absence of a rare, undetectable variant in the  genes analyzed; consider using a diagnostic test if there i s significant personal and/or family history  of one of the conditions analyzed by this test. Any potential incidental findings outside of these genes and conditions will not be identified, nor reported. The results of a genetic test may be influenced by various factors, including bone marrow  transplantation, blood transfusions, or in rare cases, hematolymphoid neoplasms.Gene Specific Notes:APOB: analysis is limited to c.10580G>A and c.10579C>T; BRCA1: sequencing analysis extends to CDS +/-20 bp; BRCA2: sequencing analysis extends to CDS  +/-20 bp. EPCAM: analysis is limited to CNVof exons 8-9; LDLR: analysis includes CNV ofthe promoter; MLH1: analysis includes CNV of the promoter; PMS2: analysis is limited to exons 1-10.Donnice JINNY Kemp, PhD, [email or web address]   RPR w/reflex to TrepSure     Status: None   Collection Time: 12/22/23 10:13 AM  Result Value Ref Range   RPR Non Reactive Non Reactive  Treponemal Antibodies, TPPA     Status: None   Collection Time: 12/22/23 10:13 AM  Result Value Ref Range   Treponemal Antibodies, TPPA Non Reactive Non Reactive   Interpretation: Comment     Comment: Syphilis: Treponemal Antibodies with Reflex to RPR and RPR           Titer, Reverse Screening and Diagnosis Algorithm ------------------------------------------------------------ Treponemal              Treponemal  Ab       RPR, Qn     Ab, TPPA    Final Interpretation ----------   --------   ----------   ----------------------- Non          N/A        N/A          No laboratory evidence Reactive                             of syphilis. Retest in                                      2-4 weeks if recent                                      exposure is suspected. ----------   --------   ----------   ----------------------- Reactive     Non        Non          Treponemal antibodies              Reactive   Reactive     not  confirmed.                                      Inconclusive for                                      syphilis; potential                                      early syphilis,                                      possible false                                       positive. Retest in 2-4                                      weeks if recent                                      exposure is suspected. ----------   --------   ----------   ----------------------- Reactive     Non        Reactive     Treponemal antibodies              Reactive                detected. Consistent  with past or current                                      (potential early)                                      syphilis. ----------   --------   ----------   ----------------------- Reactive     >/=1:1     N/A          Treponemal and                                      nontreponemal                                      antibodies detected.                                      Consistent with current                                      or past syphilis. This test is intended ONLY for specimens that have tested positive (reactive) or equivocal for Treponema pallidum antibodies prior to submission for testing.  For the full CDC-recommended syphilis screening and diagnosis algorithm, [facility] offers test code 012005 RPR, Rfx Qn RPR/Confirm TP or 917654 T pallidum Screening Cascade.   Comprehensive metabolic panel     Status: Abnormal   Collection Time: 01/08/24  2:50 PM  Result Value Ref Range   Sodium 136 135 - 145 mmol/L   Potassium 4.0 3.5 - 5.1 mmol/L   Chloride 102 98 - 111 mmol/L   CO2 25 22 - 32 mmol/L   Glucose, Bld 104 (H) 70 - 99 mg/dL    Comment: Glucose reference range applies only to samples taken after fasting for at least 8 hours.   BUN 19 8 - 23 mg/dL   Creatinine, Ser 8.89 0.61 - 1.24 mg/dL   Calcium  9.2 8.9 - 10.3 mg/dL   Total Protein 7.4 6.5 - 8.1 g/dL    Albumin 4.1 3.5 - 5.0 g/dL   AST 28 15 - 41 U/L   ALT 23 0 - 44 U/L   Alkaline Phosphatase 88 38 - 126 U/L   Total Bilirubin 0.5 0.0 - 1.2 mg/dL   GFR, Estimated >39 >39 mL/min    Comment: (NOTE) Calculated using the CKD-EPI Creatinine Equation (2021)    Anion gap 9 5 - 15    Comment: Performed at [facility], [address] [place], [place] [address]  CBC     Status: None   Collection Time: 01/08/24  2:50 PM  Result Value Ref Range   WBC 6.0 4.0 - 10.5 K/uL   RBC 4.99 4.22 - 5.81 MIL/uL   Hemoglobin 14.8 13.0 - 17.0 g/dL   HCT 56.4 60.9 - 47.9 %   MCV 87.2 80.0 - 100.0 fL   MCH 29.7 26.0 - 34.0 pg   MCHC 34.0 30.0 -  36.0 g/dL   RDW 85.3 88.4 - 84.4 %   Platelets 373 150 - 400 K/uL   nRBC 0.0 0.0 - 0.2 %    Comment: Performed at [facility], [address] [place], [place] [address]  Troponin I (High Sensitivity)     Status: None   Collection Time: 01/08/24  2:50 PM  Result Value Ref Range   Troponin I (High Sensitivity) 3 <18 ng/L    Comment: (NOTE) Elevated high sensitivity troponin I (hsTnI) values and significant  changes across serial measurements may suggest ACS but many other  chronic and acute conditions are known to elevate hsTnI results.  Refer to the Links section for chest pain algorithms and additional  guidance. Performed at [facility], [address] [place], [place] [address]   Glucose, capillary     Status: None   Collection Time: 01/08/24  7:58 PM  Result Value Ref Range   Glucose-Capillary 98 70 - 99 mg/dL    Comment: Glucose reference range applies only to samples taken after fasting for at least 8 hours.  Troponin I (High Sensitivity)     Status: None   Collection Time: 01/08/24  8:50 PM  Result Value Ref Range   Troponin I (High Sensitivity) 5 <18 ng/L    Comment: (NOTE) Elevated high sensitivity troponin I (hsTnI) values and significant  changes across serial measurements may suggest ACS but many other   chronic and acute conditions are known to elevate hsTnI results.  Refer to the Links section for chest pain algorithms and additional  guidance. Performed at [facility], [address] [place], [place] [address]   Basic metabolic panel     Status: Abnormal   Collection Time: 01/09/24  4:06 AM  Result Value Ref Range   Sodium 134 (L) 135 - 145 mmol/L   Potassium 3.9 3.5 - 5.1 mmol/L   Chloride 102 98 - 111 mmol/L   CO2 25 22 - 32 mmol/L   Glucose, Bld 91 70 - 99 mg/dL    Comment: Glucose reference range applies only to samples taken after fasting for at least 8 hours.   BUN 22 8 - 23 mg/dL   Creatinine, Ser 9.09 0.61 - 1.24 mg/dL   Calcium  8.9 8.9 - 10.3 mg/dL   GFR, Estimated >39 >39 mL/min    Comment: (NOTE) Calculated using the CKD-EPI Creatinine Equation (2021)    Anion gap 7 5 - 15    Comment: Performed at [facility], [address] [place], [place] [address]  CBC     Status: None   Collection Time: 01/09/24  4:06 AM  Result Value Ref Range   WBC 5.5 4.0 - 10.5 K/uL   RBC 4.63 4.22 - 5.81 MIL/uL   Hemoglobin 13.5 13.0 - 17.0 g/dL   HCT 58.8 60.9 - 47.9 %   MCV 88.8 80.0 - 100.0 fL   MCH 29.2 26.0 - 34.0 pg   MCHC 32.8 30.0 - 36.0 g/dL   RDW 85.5 88.4 - 84.4 %   Platelets 299 150 - 400 K/uL   nRBC 0.0 0.0 - 0.2 %    Comment: Performed at [facility], [address] [place], [place] [address]  Urinalysis, Routine w reflex microscopic -Urine, Clean Catch     Status: Abnormal   Collection Time: 01/09/24  9:20 AM  Result Value Ref Range   Color, Urine YELLOW (A) YELLOW   APPearance CLEAR (A) CLEAR   Specific Gravity, Urine  1.011 1.005 - 1.030   pH 7.0 5.0 - 8.0   Glucose, UA NEGATIVE NEGATIVE mg/dL   Hgb urine dipstick NEGATIVE NEGATIVE   Bilirubin Urine NEGATIVE NEGATIVE   Ketones, ur NEGATIVE NEGATIVE mg/dL   Protein, ur NEGATIVE NEGATIVE mg/dL   Nitrite NEGATIVE NEGATIVE   Leukocytes,Ua NEGATIVE NEGATIVE    Comment:  Performed at [facility], [address] [place], [place] [address]  ECHOCARDIOGRAM COMPLETE     Status: None   Collection Time: 01/09/24  2:56 PM  Result Value Ref Range   Weight 3,396.85 oz   Height 64 in   BP 125/77 mmHg   Ao pk vel 1.17 m/s   AV Area VTI 3.06 cm2   AR max vel 3.17 cm2   AV Mean grad 3.0 mmHg   AV Peak grad 5.5 mmHg   Single Plane A2C EF 47.6 %   Single Plane A4C EF 56.3 %   Calc EF 51.6 %   S' Lateral 2.70 cm   AV Area mean vel 3.06 cm2   Area-P 1/2 2.99 cm2   MV VTI 3.15 cm2   Est EF 55 - 60%   Lipid panel     Status: Abnormal   Collection Time: 01/10/24  6:10 AM  Result Value Ref Range   Cholesterol 213 (H) 0 - 200 mg/dL   Triglycerides 64 <849 mg/dL   HDL 60 >59 mg/dL   Total CHOL/HDL Ratio 3.6 RATIO   VLDL 13 0 - 40 mg/dL   LDL Cholesterol 859 (H) 0 - 99 mg/dL    Comment:        Total Cholesterol/HDL:CHD Risk Coronary Heart Disease Risk Table                     Men   Women  1/2 Average Risk   3.4   3.3  Average Risk       5.0   4.4  2 X Average Risk   9.6   7.1  3 X Average Risk  23.4   11.0        Use the calculated Patient Ratio above and the CHD Risk Table to determine the patient's CHD Risk.        ATP III CLASSIFICATION (LDL):  <100     mg/dL   Optimal  899-870  mg/dL   Near or Above                    Optimal  130-159  mg/dL   Borderline  839-810  mg/dL   High  >809     mg/dL   Very High Performed at [facility], [address] [place], [place] [address]   Basic metabolic panel with GFR     Status: Abnormal   Collection Time: 01/10/24  6:10 AM  Result Value Ref Range   Sodium 133 (L) 135 - 145 mmol/L   Potassium 3.8 3.5 - 5.1 mmol/L   Chloride 103 98 - 111 mmol/L   CO2 21 (L) 22 - 32 mmol/L   Glucose, Bld 90 70 - 99 mg/dL    Comment: Glucose reference range applies only to samples taken after fasting for at least 8 hours.   BUN 17 8 - 23 mg/dL   Creatinine, Ser 9.26 0.61 - 1.24 mg/dL   Calcium  8.8  (L) 8.9 - 10.3 mg/dL   GFR, Estimated >39 >39 mL/min    Comment: (NOTE) Calculated using the CKD-EPI Creatinine Equation (2021)  Anion gap 9 5 - 15    Comment: Performed at [facility], [address] [place], [place] [address]  Glucose, capillary     Status: None   Collection Time: 01/10/24  7:01 AM  Result Value Ref Range   Glucose-Capillary 95 70 - 99 mg/dL    Comment: Glucose reference range applies only to samples taken after fasting for at least 8 hours.  Basic metabolic panel     Status: Abnormal   Collection Time: 01/11/24  4:22 AM  Result Value Ref Range   Sodium 133 (L) 135 - 145 mmol/L   Potassium 3.8 3.5 - 5.1 mmol/L   Chloride 102 98 - 111 mmol/L   CO2 23 22 - 32 mmol/L   Glucose, Bld 89 70 - 99 mg/dL    Comment: Glucose reference range applies only to samples taken after fasting for at least 8 hours.   BUN 15 8 - 23 mg/dL   Creatinine, Ser 9.31 0.61 - 1.24 mg/dL   Calcium  9.0 8.9 - 10.3 mg/dL   GFR, Estimated >39 >39 mL/min    Comment: (NOTE) Calculated using the CKD-EPI Creatinine Equation (2021)    Anion gap 8 5 - 15    Comment: Performed at [facility], [address] [place], [place] [address]  Glucose, capillary     Status: Abnormal   Collection Time: 01/11/24  5:40 AM  Result Value Ref Range   Glucose-Capillary 105 (H) 70 - 99 mg/dL    Comment: Glucose reference range applies only to samples taken after fasting for at least 8 hours.      Assessment & Plan:  Continue taking medications as prescribed. Reschedule Neurology and Rheumatology appointments. Use walker and knee brace for left knee with increased weakness in left knee. Follow up with Orthopedics for L knee weakness. Previous knee replacement in 2016. Will check basic labs following hospital discharge. Problem List Items Addressed This Visit     Essential hypertension, benign - Primary   Arthritis of left knee   Arthritis   OSA (obstructive sleep apnea)   GAD  (generalized anxiety disorder)   History of syncope   Relevant Orders   CBC with Diff   CMP14+EGFR    Return in about 2 months (around 03/16/2024).   Total time spent: 30 minutes  FERNAND FREDY RAMAN, MD  01/15/2024   This document may have been prepared by [facility] Voice Recognition software and as such may include unintentional dictation errors.

## 2024-01-16 ENCOUNTER — Ambulatory Visit: Payer: Self-pay | Admitting: Internal Medicine

## 2024-01-16 LAB — CMP14+EGFR
ALT: 20 IU/L (ref 0–44)
AST: 26 IU/L (ref 0–40)
Albumin: 4.4 g/dL (ref 3.8–4.8)
Alkaline Phosphatase: 112 IU/L (ref 44–121)
BUN/Creatinine Ratio: 19 (ref 10–24)
BUN: 19 mg/dL (ref 8–27)
Bilirubin Total: 0.3 mg/dL (ref 0.0–1.2)
CO2: 21 mmol/L (ref 20–29)
Calcium: 9.3 mg/dL (ref 8.6–10.2)
Chloride: 96 mmol/L (ref 96–106)
Creatinine, Ser: 1.02 mg/dL (ref 0.76–1.27)
Globulin, Total: 2.4 g/dL (ref 1.5–4.5)
Glucose: 86 mg/dL (ref 70–99)
Potassium: 4.6 mmol/L (ref 3.5–5.2)
Sodium: 134 mmol/L (ref 134–144)
Total Protein: 6.8 g/dL (ref 6.0–8.5)
eGFR: 77 mL/min/1.73 (ref >59)

## 2024-01-16 LAB — CBC WITH DIFFERENTIAL/PLATELET
Basophils Absolute: 0 x10E3/uL (ref 0.0–0.2)
Basos: 1 %
EOS (ABSOLUTE): 0.3 x10E3/uL (ref 0.0–0.4)
Eos: 4 %
Hematocrit: 42.7 % (ref 37.5–51.0)
Hemoglobin: 14.1 g/dL (ref 13.0–17.7)
Immature Grans (Abs): 0 x10E3/uL (ref 0.0–0.1)
Immature Granulocytes: 0 %
Lymphocytes Absolute: 1.6 x10E3/uL (ref 0.7–3.1)
Lymphs: 27 %
MCH: 29.7 pg (ref 26.6–33.0)
MCHC: 33 g/dL (ref 31.5–35.7)
MCV: 90 fL (ref 79–97)
Monocytes Absolute: 0.6 x10E3/uL (ref 0.1–0.9)
Monocytes: 11 %
Neutrophils Absolute: 3.2 x10E3/uL (ref 1.4–7.0)
Neutrophils: 57 %
Platelets: 354 x10E3/uL (ref 150–450)
RBC: 4.75 x10E6/uL (ref 4.14–5.80)
RDW: 13.8 % (ref 11.6–15.4)
WBC: 5.7 x10E3/uL (ref 3.4–10.8)

## 2024-01-16 NOTE — Progress Notes (Signed)
 Patient notified

## 2024-02-21 ENCOUNTER — Ambulatory Visit (INDEPENDENT_AMBULATORY_CARE_PROVIDER_SITE_OTHER): Admitting: Dermatology

## 2024-02-21 ENCOUNTER — Encounter: Payer: Self-pay | Admitting: Dermatology

## 2024-02-21 DIAGNOSIS — L720 Epidermal cyst: Secondary | ICD-10-CM

## 2024-02-21 NOTE — Patient Instructions (Addendum)
 Wound Care Instructions  On the day following your surgery, you should begin doing daily dressing changes: Remove the old dressing and discard it. Cleanse the wound gently with tap water. This may be done in the shower or by placing a wet gauze pad directly on the wound and letting it soak for several minutes. It is important to gently remove any dried blood from the wound in order to encourage healing. This may be done by gently rolling a moistened Q-tip on the dried blood. Do not pick at the wound. If the wound should start to bleed, continue cleaning the wound, then place a moist gauze pad on the wound and hold pressure for a few minutes.  Make sure you then dry the skin surrounding the wound completely or the tape will not stick to the skin. Do not use cotton balls on the wound. After the wound is clean and dry, apply the ointment gently with a Q-tip. Cut a non-stick pad to fit the size of the wound. Lay the pad flush to the wound. If the wound is draining, you may want to reinforce it with a small amount of gauze on top of the non-stick pad for a little added compression to the area. Use the tape to seal the area completely. Select from the following with respect to your individual situation: If your wound has been stitched closed: continue the above steps 1-8 at least daily until your sutures are removed. If your wound has been left open to heal: continue steps 1-8 at least daily for the first 3-4 weeks. We would like for you to take a few extra precautions for at least the next week. Sleep with your head elevated on pillows if our wound is on your head. Do not bend over or lift heavy items to reduce the chance of elevated blood pressure to the wound Do not participate in particularly strenuous activities.   Below is a list of dressing supplies you might need.  Cotton-tipped applicators - Q-tips Gauze pads (2x2 and/or 4x4) - All-Purpose Sponges Non-stick dressing material - Telfa Tape -  Paper or Hypafix New and clean tube of petroleum jelly - Vaseline    Comments on Post-Operative Period Slight swelling and redness often appear around the wound. This is normal and will disappear within several days following the surgery. The healing wound will drain a brownish-red-yellow discharge during healing. This is a normal phase of wound healing. As the wound begins to heal, the drainage may increase in amount. Again, this drainage is normal. Notify us  if the drainage becomes persistently bloody, excessively swollen, or intensely painful or develops a foul odor or red streaks.  If you should experience mild discomfort during the healing phase, you may take an aspirin-free medication such as Tylenol  (acetaminophen ). Notify us  if the discomfort is severe or persistent. Avoid alcoholic beverages when taking pain medicine.  In Case of Wound Hemorrhage A wound hemorrhage is when the bandage suddenly becomes soaked with bright red blood and flows profusely. If this happens, sit down or lie down with your head elevated. If the wound has a dressing on it, do not remove the dressing. Apply pressure to the existing gauze. If the wound is not covered, use a gauze pad to apply pressure and continue applying the pressure for 20 minutes without peeking. DO NOT COVER THE WOUND WITH A LARGE TOWEL OR WASH CLOTH. Release your hand from the wound site but do not remove the dressing. If the bleeding has stopped,  gently clean around the wound. Leave the dressing in place for 24 hours if possible. This wait time allows the blood vessels to close off so that you do not spark a new round of bleeding by disrupting the newly clotted blood vessels with an immediate dressing change. If the bleeding does not subside, continue to hold pressure. If matters are out of your control, contact an After Hours clinic or go to the Emergency Room.     Due to recent changes in healthcare laws, you may see results of your pathology  and/or laboratory studies on MyChart before the doctors have had a chance to review them. We understand that in some cases there may be results that are confusing or concerning to you. Please understand that not all results are received at the same time and often the doctors may need to interpret multiple results in order to provide you with the best plan of care or course of treatment. Therefore, we ask that you please give us  2 business days to thoroughly review all your results before contacting the office for clarification. Should we see a critical lab result, you will be contacted sooner.   If You Need Anything After Your Visit  If you have any questions or concerns for your doctor, please call our main line at [phone] and press option 4 to reach your doctor's medical assistant. If no one answers, please leave a voicemail as directed and we will return your call as soon as possible. Messages left after 4 pm will be answered the following business day.   You may also send us  a message via MyChart. We typically respond to MyChart messages within 1-2 business days.  For prescription refills, please ask your pharmacy to contact our office. Our fax number is [phone].  If you have an urgent issue when the clinic is closed that cannot wait until the next business day, you can page your doctor at the number below.    Please note that while we do our best to be available for urgent issues outside of office hours, we are not available 24/7.   If you have an urgent issue and are unable to reach us , you may choose to seek medical care at your doctor's office, retail clinic, urgent care center, or emergency room.  If you have a medical emergency, please immediately call 911 or go to the emergency department.  Pager Numbers  - Dr. Hester: [phone]  - Dr. Jackquline: [phone]  - Dr. Claudene: [phone]   - Dr. Raymund: [phone]  In the event of inclement weather, please call our  main line at [phone] for an update on the status of any delays or closures.  Dermatology Medication Tips: Please keep the boxes that topical medications come in in order to help keep track of the instructions about where and how to use these. Pharmacies typically print the medication instructions only on the boxes and not directly on the medication tubes.   If your medication is too expensive, please contact our office at [phone] option 4 or send us  a message through MyChart.   We are unable to tell what your co-pay for medications will be in advance as this is different depending on your insurance coverage. However, we may be able to find a substitute medication at lower cost or fill out paperwork to get insurance to cover a needed medication.   If a prior authorization is required to get your medication covered by your insurance company, please allow us   1-2 business days to complete this process.  Drug prices often vary depending on where the prescription is filled and some pharmacies may offer cheaper prices.  The website www.goodrx.com contains coupons for medications through different pharmacies. The prices here do not account for what the cost may be with help from insurance (it may be cheaper with your insurance), but the website can give you the price if you did not use any insurance.  - You can print the associated coupon and take it with your prescription to the pharmacy.  - You may also stop by our office during regular business hours and pick up a GoodRx coupon card.  - If you need your prescription sent electronically to a different pharmacy, notify our office through [facility] or by phone at [phone] option 4.     Si Usted Necesita Algo Despus de Su Visita  Tambin puede enviarnos un mensaje a travs de Clinical cytogeneticist. Por lo general respondemos a los mensajes de MyChart en el transcurso de 1 a 2 das hbiles.  Para renovar recetas, por favor pida a su  farmacia que se ponga en contacto con nuestra oficina. Randi lakes de fax es [place] [phone].  Si tiene un asunto urgente cuando la clnica est cerrada y que no puede esperar hasta el siguiente da hbil, puede llamar/localizar a su doctor(a) al nmero que aparece a continuacin.   Por favor, tenga en cuenta que aunque hacemos todo lo posible para estar disponibles para asuntos urgentes fuera del horario de [place], no estamos disponibles las 24 horas del da, los 7 [address]  [address] de la [place].   Si tiene un problema urgente y no puede comunicarse con nosotros, puede optar por buscar atencin mdica  en el consultorio de su doctor(a), en una clnica privada, en un centro de atencin urgente o en una sala de emergencias.  Si tiene Engineer, drilling, por favor llame inmediatamente al 911 o vaya a la sala de emergencias.  Nmeros de bper  - Dr. Hester: [phone]  - Dra. Jackquline: [phone]  - Dr. Claudene: [phone]  - Dra. Kitts: [phone]  En caso de inclemencias del [place], por favor llame a nuestra lnea principal al [phone] para una actualizacin sobre el estado de cualquier retraso o cierre.  Consejos para la medicacin en dermatologa: Por favor, guarde las cajas en las que vienen los medicamentos de uso tpico para ayudarle a seguir las instrucciones sobre dnde y cmo usarlos. Las farmacias generalmente imprimen las instrucciones del medicamento slo en las cajas y no directamente en los tubos del [place].   Si su medicamento es muy caro, por favor, pngase en contacto con landry rieger llamando al [phone] y presione la opcin 4 o envenos un mensaje a travs de Clinical cytogeneticist.   No podemos decirle cul ser su copago por los medicamentos por adelantado ya que esto es diferente dependiendo de la cobertura de su seguro. Sin embargo, es posible que podamos encontrar un medicamento sustituto a Audiological scientist un formulario para que el seguro cubra el medicamento  que se considera necesario.   Si se requiere una autorizacin previa para que su compaa de seguros malta su medicamento, por favor permtanos de 1 a 2 das hbiles para completar este proceso.  Los precios de los medicamentos varan con frecuencia dependiendo del Environmental consultant de dnde se surte la receta y alguna farmacias pueden ofrecer precios ms baratos.  El sitio web www.goodrx.com tiene cupones para medicamentos de Health and safety inspector. Los precios aqu no tienen en  cuenta lo que podra costar con la ayuda del seguro (puede ser ms barato con su seguro), pero el sitio web puede darle el precio si no Visual merchandiser.  - Puede imprimir el cupn correspondiente y llevarlo con su receta a la farmacia.  - Tambin puede pasar por nuestra oficina durante el horario de atencin regular y Education officer, museum una tarjeta de cupones de GoodRx.  - Si necesita que su receta se enve electrnicamente a una farmacia diferente, informe a nuestra oficina a travs de MyChart de [place] o por telfono llamando al [phone] y presione la opcin 4.

## 2024-02-21 NOTE — Progress Notes (Signed)
   Follow-Up Visit   Subjective  Vincent Cole is a 73 y.o. male who presents for the following: Excision of Epidermal cyst   The following portions of the chart were reviewed this encounter and updated as appropriate: medications, allergies, medical history  Review of Systems:  No other skin or systemic complaints except as noted in HPI or Assessment and Plan.  Objective  Well appearing patient in no apparent distress; mood and affect are within normal limits.  A focused examination was performed of the following areas: Upper back spinal Relevant physical exam findings are noted in the Assessment and Plan.   upper back spinal Subcutaneous nodule.   Assessment & Plan   EPIDERMAL INCLUSION CYST upper back spinal Skin excision - upper back spinal  Excision method:  elliptical Total excision diameter (cm):  1.8 Informed consent: discussed and consent obtained   Timeout: patient name, date of birth, surgical site, and procedure verified   Procedure prep:  Patient was prepped and draped in usual sterile fashion Prep type:  Chlorhexidine  Anesthesia: the lesion was anesthetized in a standard fashion   Anesthetic:  1% lidocaine  w/ epinephrine 1-100,000 buffered w/ 8.4% NaHCO3 (18 cc) Instrument used: #15 blade   Hemostasis achieved with: suture, pressure and electrodesiccation   Outcome: patient tolerated procedure well with no complications    Skin repair - upper back spinal Complexity:  Intermediate Final length (cm):  3.5 Informed consent: discussed and consent obtained   Timeout: patient name, date of birth, surgical site, and procedure verified   Procedure prep:  Patient was prepped and draped in usual sterile fashion Prep type:  Chlorhexidine  Anesthesia: the lesion was anesthetized in a standard fashion   Anesthetic:  1% lidocaine  w/ epinephrine 1-100,000 buffered w/ 8.4% NaHCO3 Reason for type of repair: reduce tension to allow closure, reduce the risk of dehiscence,  infection, and necrosis, reduce subcutaneous dead space and avoid a hematoma, allow closure of the large defect and preserve normal anatomy   Undermining: edges could be approximated without difficulty   Subcutaneous layers (deep stitches):  Suture size:  3-0 Suture type: Monocryl (poliglecaprone 25)   Stitches:  Buried vertical mattress Fine/surface layer approximation (top stitches):  Suture size:  4-0 Suture type: Prolene (polypropylene)   Stitches comment:  Running locked Suture removal (days):  7 Hemostasis achieved with: suture, pressure and electrodesiccation Outcome: patient tolerated procedure well with no complications   Post-procedure details: sterile dressing applied and wound care instructions given   Dressing type: petrolatum, bandage and pressure dressing    Specimen 1 - Surgical pathology Differential Diagnosis: Epidermal inclusion cyst   Check Margins: No Subcutaneous nodule   Return in about 2 weeks (around 03/06/2024) for suture removal .  I, Fay Kirks, CMA, am acting as scribe for Boneta Sharps, MD .   Documentation: I have reviewed the above documentation for accuracy and completeness, and I agree with the above.  Boneta Sharps, MD

## 2024-02-22 ENCOUNTER — Telehealth: Payer: Self-pay

## 2024-02-22 NOTE — Telephone Encounter (Signed)
 Called patient to check on him after surgery here yesterday, patient report he is doing well no concerns or problems

## 2024-02-23 LAB — SURGICAL PATHOLOGY

## 2024-02-26 ENCOUNTER — Ambulatory Visit: Payer: Self-pay | Admitting: Dermatology

## 2024-02-26 NOTE — Telephone Encounter (Signed)
-----   Message from [place] sent at 02/26/2024  4:24 PM EDT ----- Diagnosis upper back spinal :       EPIDERMAL INCLUSION CYST   Please call to share that excision was clear of cyst and get update on surgical wound. Thank you. ----- Message ----- From: Interface, Lab [facility] [place] Sent: 02/23/2024   3:56 PM EDT To: Boneta Sharps, MD

## 2024-02-26 NOTE — Telephone Encounter (Signed)
 Left message on voicemail for patient to return phone call

## 2024-02-28 ENCOUNTER — Other Ambulatory Visit: Payer: Self-pay | Admitting: Family

## 2024-02-28 DIAGNOSIS — F330 Major depressive disorder, recurrent, mild: Secondary | ICD-10-CM

## 2024-03-05 NOTE — Telephone Encounter (Signed)
 Spoke with patient and advised of results. Patient states he is doing fine after surgery.

## 2024-03-05 NOTE — Telephone Encounter (Signed)
-----   Message from [place] sent at 02/26/2024  4:24 PM EDT ----- Diagnosis upper back spinal :       EPIDERMAL INCLUSION CYST   Please call to share that excision was clear of cyst and get update on surgical wound. Thank you. ----- Message ----- From: Interface, Lab [facility] [place] Sent: 02/23/2024   3:56 PM EDT To: Boneta Sharps, MD

## 2024-03-06 ENCOUNTER — Ambulatory Visit (INDEPENDENT_AMBULATORY_CARE_PROVIDER_SITE_OTHER)

## 2024-03-06 DIAGNOSIS — Z48817 Encounter for surgical aftercare following surgery on the skin and subcutaneous tissue: Secondary | ICD-10-CM

## 2024-03-06 NOTE — Patient Instructions (Signed)

## 2024-03-06 NOTE — Progress Notes (Signed)
   Follow-Up Visit   Subjective  Jayron Maqueda is a 73 y.o. male who presents for the following: Suture removal.   Pathology showed EPIDERMAL INCLUSION CYST  The following portions of the chart were reviewed this encounter and updated as appropriate: medications, allergies, medical history  Review of Systems:  No other skin or systemic complaints except as noted in HPI or Assessment and Plan.  Objective  Well appearing patient in no apparent distress; mood and affect are within normal limits.  Areas Examined: Upper back spinal  Relevant physical exam findings are noted in the Assessment and Plan.        Assessment & Plan    Encounter for Removal of Sutures - Incision site is clean, dry and intact. - Wound cleansed, sutures removed, wound cleansed and steri strips applied.  - Discussed pathology results showing EPIDERMAL INCLUSION CYST. - Scars remodel for a full year. - Patient can apply over-the-counter silicone scar cream once to twice a day to help with scar remodeling if desired. - Patient advised to call with any concerns or if they notice any new or changing lesions.  Return if symptoms worsen or fail to improve.  Nikeshia Keetch V Tysheena Ginzburg, CMA

## 2024-03-12 ENCOUNTER — Other Ambulatory Visit: Payer: Self-pay | Admitting: Internal Medicine

## 2024-03-12 DIAGNOSIS — F330 Major depressive disorder, recurrent, mild: Secondary | ICD-10-CM

## 2024-03-18 ENCOUNTER — Ambulatory Visit: Admitting: Internal Medicine

## 2024-03-21 ENCOUNTER — Other Ambulatory Visit: Payer: Self-pay

## 2024-03-21 DIAGNOSIS — M2041 Other hammer toe(s) (acquired), right foot: Secondary | ICD-10-CM

## 2024-03-25 ENCOUNTER — Ambulatory Visit
Admission: RE | Admit: 2024-03-25 | Discharge: 2024-03-25 | Disposition: A | Discharge status: Home / Self Care | Source: Ambulatory Visit

## 2024-03-25 ENCOUNTER — Other Ambulatory Visit: Payer: Self-pay

## 2024-03-25 ENCOUNTER — Inpatient Hospital Stay
Admission: RE | Admit: 2024-03-25 | Discharge: 2024-03-25 | Disposition: A | Discharge status: Home / Self Care | Payer: Self-pay | Source: Ambulatory Visit | Attending: Orthopedic Surgery | Admitting: Orthopedic Surgery

## 2024-03-25 DIAGNOSIS — M2041 Other hammer toe(s) (acquired), right foot: Secondary | ICD-10-CM | POA: Diagnosis present

## 2024-03-25 DIAGNOSIS — M47816 Spondylosis without myelopathy or radiculopathy, lumbar region: Secondary | ICD-10-CM

## 2024-03-26 NOTE — Progress Notes (Signed)
 Referring Physician:  Fernand Fredy RAMAN, MD [address] [place],  [place] [address]  Primary Physician:  Fernand Fredy RAMAN, MD  History of Present Illness: 04/01/2024 [name] has a history of HTN, OSA, GERD, BPH, interstitial cystitis, hyperlipidemia, GAD, depression, morbid obesity, DVT.   Has seen [facility], most recently on 03/21/24- is being scheduled for bilateral L4-S1 RFA. Has discussed surgery (L2-pelvis) with Dr. Dow as well.   Per [facility] notes, EMG showed chronic L5 radiculopathies bilaterally- I don't see actual EMG report.   He has constant LBP that is better with laying down in bed. Some relief with sitting as well. Pain is worse with bending and walking. He has no radicular leg pain, but notes bilateral knee pain (history of bilateral TKA). He has weakness in left knee. He has numbness in both knees.   He is weaning off his neurontin  and is going to start lyrica when off. He is taking mobic  and zanaflex.   Tobacco use: Does not smoke.   Bowel/Bladder Dysfunction: none  Conservative measures:  Physical therapy:  has not participated for his back recently, did for his balance Multimodal medical therapy including regular antiinflammatories:  Gabapentin , Lyrica, Tylenol , Flexeril, Cymbalta , Meloxicam , Tizanidine, Tramadol, Robaxin, Hydrocodone . Injections:   02/07/2024: Left L3-L4 transforaminal epidural steroid injection  07/12/2023: Bilateral lumbar medial branch radiofrequency ablation  01/25/2023: L5-S1 LESI - Radicular pain 100% resolved  02/14/2022:BILATERAL - TF ESI @ L5-S1   Past Surgery: none  Vincent Cole has no symptoms of cervical myelopathy.  The symptoms are causing a significant impact on the patient's life.   Review of Systems:  A 10 point review of systems is negative, except for the pertinent positives and negatives detailed in the HPI.  Past Medical History: Past Medical History:  Diagnosis Date   Abdominal pain 01/17/2011    Formatting of this note might be different from the original.  Note: Unchanged - chronic  suprapubic/lower abdominal area since TURP in 2005; urology has now diagnosed, after extensive evaluation, interstitial cystitis; has also had repeat TURP in June 2010, and a surgery on the neck of the bladder in July of 2011     Actinic keratosis    Arthritis    Benign bladder mass    Complete tear of left rotator cuff 04/19/2017   Complication of anesthesia    PONV x 1 in 2010   Deep venous thrombosis (HCC) 12/30/2023   Depression    Frequency of urination    GERD (gastroesophageal reflux disease) no meds   Heart murmur    was told many years ago   History of DVT of lower extremity 02/03/2020   History of recurrent UTIs    Hypertension    IC (interstitial cystitis)    Nocturia    OSA on CPAP nightly   Pelvic pain in male    PONV (postoperative nausea and vomiting)    Simple renal cyst    Urgency of urination    Urinary hesitancy     Past Surgical History: Past Surgical History:  Procedure Laterality Date   BUNIONECTOMY Right 01/15/2021   Procedure: ZELL HAI;  Surgeon: Ashley Soulier, DPM;  Location: [facility];  Service: Podiatry;  Laterality: Right;   COLONOSCOPY     COLONOSCOPY WITH PROPOFOL  N/A 06/25/2021   Procedure: COLONOSCOPY WITH PROPOFOL ;  Surgeon: Maryruth Ole DASEN, MD;  Location: [facility];  Service: Endoscopy;  Laterality: N/A;   CORRECTION OVERLAPPING TOES Right 01/15/2021   Procedure: CORRECTION OVERLAPPING TOES;  Surgeon: Ashley Soulier, DPM;  Location: [facility];  Service: Podiatry;  Laterality: Right;   CYSTO WITH HYDRODISTENSION  06/02/2011   Procedure: CYSTOSCOPY/HYDRODISTENSION;  Surgeon: Glendia DELENA Elizabeth, MD;  Location: [facility];  Service: Urology;  Laterality: N/A;  catheter placement   HAMMER TOE SURGERY Right 01/15/2021   Procedure: HAMMER TOE CORRECTION;  Surgeon: Ashley Soulier, DPM;  Location: [facility];  Service: Podiatry;   Laterality: Right;   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   JOINT REPLACEMENT     shoulder, left knee   KNEE ARTHROSCOPY     TRANSURETHRAL RESECTION OF PROSTATE  X3   LAST ONE MAY 2012   WEIL OSTEOTOMY Right 01/15/2021   Procedure: WEIL;  Surgeon: Ashley Soulier, DPM;  Location: [facility];  Service: Podiatry;  Laterality: Right;    Allergies: Allergies as of 04/01/2024   (No Known Allergies)    Medications: Outpatient Encounter Medications as of 04/01/2024  Medication Sig   acetaminophen  (TYLENOL ) 325 MG tablet Take 2 tablets (650 mg total) by mouth every 6 (six) hours as needed for mild pain (pain score 1-3) or fever (or Fever >/= 101).   amLODipine -benazepril  (LOTREL) 5-40 MG capsule TAKE 1 CAPSULE BY MOUTH EVERY DAY   aspirin  EC 81 MG tablet Take 81 mg by mouth daily. Swallow whole.   busPIRone  (BUSPAR ) 10 MG tablet TAKE 1 TABLET BY MOUTH THREE TIMES A DAY AS NEEDED   DULoxetine  (CYMBALTA ) 60 MG capsule TAKE 1 CAPSULE BY MOUTH EVERY DAY   gabapentin  (NEURONTIN ) 800 MG tablet Take 800 mg by mouth 2 (two) times daily.   hydrocortisone  2.5 % cream Apply topically to aa of face t- thur- sat nightly   hydrOXYzine  (ATARAX ) 25 MG tablet Take 50 mg by mouth every evening.   ketoconazole  (NIZORAL ) 2 % cream Apply to affected areas on face at bedtime on M W Fri   meloxicam  (MOBIC ) 15 MG tablet Take 15 mg by mouth in the morning.   Omega-3 Fatty Acids (OMEGA 3 PO) Take 1 capsule by mouth once a week.   pregabalin (LYRICA) 50 MG capsule Take 50 mg by mouth.   rosuvastatin  (CRESTOR ) 10 MG tablet Take 1 tablet (10 mg total) by mouth daily.   tiZANidine (ZANAFLEX) 4 MG tablet Take 4 mg by mouth at bedtime.   torsemide  (DEMADEX ) 10 MG tablet TAKE 1 TABLET BY MOUTH EVERY DAY   traZODone  (DESYREL ) 50 MG tablet TAKE 1 TABLET BY MOUTH NIGHTLY   [DISCONTINUED] fluticasone (FLONASE) 50 MCG/ACT nasal spray Place 1-2 sprays into both nostrils at bedtime. (Patient not taking: Reported on  01/15/2024)   No facility-administered encounter medications on file as of 04/01/2024.    Social History: Social History   Tobacco Use   Smoking status: Never   Smokeless tobacco: Never  Vaping Use   Vaping status: Never Used  Substance Use Topics   Alcohol use: Yes    Comment: occ   Drug use: No    Family Medical History: Family History  Problem Relation Age of Onset   Heart disease Mother     Physical Examination: Vitals:   04/01/24 1007  BP: 114/64    General: Patient is well developed, well nourished, calm, collected, and in no apparent distress. Attention to examination is appropriate.  Respiratory: Patient is breathing without any difficulty.   NEUROLOGICAL:     Awake, alert, oriented to person, place, and time.  Speech is clear and fluent. Fund of knowledge is appropriate.  Cranial Nerves: Pupils equal round and reactive to light.  Facial tone is symmetric.    Mild lower posterior lumbar tenderness.   No abnormal lesions on exposed skin.   Strength: Side Biceps Triceps Deltoid Interossei Grip Wrist Ext. Wrist Flex.  R 5 5 5 5 5 5 5   L 5 5 5 5 5 5 5    Side Iliopsoas Quads Hamstring PF DF EHL  R 5 5 5 5 5 5   L 5 5 5 5 5 5    Reflexes are 2+ and symmetric at the biceps, brachioradialis, patella and achilles.   Hoffman's is absent.  Clonus is not present.   Bilateral upper and lower extremity sensation is intact to light touch.     No pain with IR/ER of both hips.   Gait is slow.   Medical Decision Making  Imaging: Lumbar MRI 01/23/24:      I have personally reviewed the images and agree with the above interpretation.  Assessment and Plan: Mr. Zacharia has constant LBP that is better with laying down in bed. Some relief with sitting as well. Pain is worse with bending and walking. He has no radicular leg pain, but notes bilateral knee pain (history of bilateral TKA).   He has known lumbar spondylosis, DDD, and retrolisthesis L2-L4. 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   Per [facility] notes, EMG of bilateral lower extremities showed chronic L5 radiculopathies bilaterally- I don't see actual EMG report. He thinks EMG was done in September of 2025.   He is interested in surgery options for his back.   Treatment options discussed with patient and following plan made:   - PT orders to [facility] for lumbar spine. Discussed he would need to do at least 6 weeks of PT prior to any surgical discussion.  - He is scheduled to lumbar RFA with [facility]. Okay to proceed with this from my standpoint.  - Lumbar xrays with flex/ext done today on his way out of clinic. Will message him with results.  - Will message him in 4 weeks to check on progress with PT. If no improvement after 6 weeks, will have him see Dr. Claudene to discuss possible surgery options.  - He is concerned about possible osteoporosis if he needs a fusion. May need DEXA, but will discuss further with Dr. Claudene.   I spent a total of 45 minutes in face-to-face and non-face-to-face activities related to this patient's care today including review of outside records, review of imaging, review of symptoms, physical exam, discussion of differential diagnosis, discussion of treatment options, and documentation.   Thank you for involving me in the care of this patient.   Glade Boys PA-C Dept. of Neurosurgery

## 2024-03-29 ENCOUNTER — Ambulatory Visit: Admitting: Family

## 2024-03-29 ENCOUNTER — Encounter: Payer: Self-pay | Admitting: Family

## 2024-03-29 DIAGNOSIS — R350 Frequency of micturition: Secondary | ICD-10-CM | POA: Diagnosis not present

## 2024-03-29 LAB — POCT URINALYSIS DIPSTICK
Bilirubin, UA: NEGATIVE
Blood, UA: NEGATIVE
Glucose, UA: NEGATIVE
Ketones, UA: NEGATIVE
Leukocytes, UA: NEGATIVE
Nitrite, UA: NEGATIVE
Protein, UA: NEGATIVE
Spec Grav, UA: 1.02 (ref 1.010–1.025)
Urobilinogen, UA: 1 U/dL
pH, UA: 7 (ref 5.0–8.0)

## 2024-03-30 LAB — URINALYSIS, ROUTINE W REFLEX MICROSCOPIC
Bilirubin, UA: NEGATIVE
Glucose, UA: NEGATIVE
Ketones, UA: NEGATIVE
Leukocytes,UA: NEGATIVE
Nitrite, UA: NEGATIVE
Protein,UA: NEGATIVE
RBC, UA: NEGATIVE
Specific Gravity, UA: 1.021 (ref 1.005–1.030)
Urobilinogen, Ur: 1 mg/dL (ref 0.2–1.0)
pH, UA: 7 (ref 5.0–7.5)

## 2024-04-01 ENCOUNTER — Ambulatory Visit: Admitting: Orthopedic Surgery

## 2024-04-01 ENCOUNTER — Ambulatory Visit (INDEPENDENT_AMBULATORY_CARE_PROVIDER_SITE_OTHER)

## 2024-04-01 ENCOUNTER — Encounter: Payer: Self-pay | Admitting: Orthopedic Surgery

## 2024-04-01 VITALS — BP 114/64 | Ht 64.5 in | Wt 217.0 lb

## 2024-04-01 DIAGNOSIS — M47816 Spondylosis without myelopathy or radiculopathy, lumbar region: Secondary | ICD-10-CM

## 2024-04-01 DIAGNOSIS — M5136 Other intervertebral disc degeneration, lumbar region with discogenic back pain only: Secondary | ICD-10-CM

## 2024-04-01 DIAGNOSIS — M4726 Other spondylosis with radiculopathy, lumbar region: Secondary | ICD-10-CM | POA: Diagnosis not present

## 2024-04-01 DIAGNOSIS — M48061 Spinal stenosis, lumbar region without neurogenic claudication: Secondary | ICD-10-CM

## 2024-04-01 DIAGNOSIS — M545 Low back pain, unspecified: Secondary | ICD-10-CM

## 2024-04-01 DIAGNOSIS — M4310 Spondylolisthesis, site unspecified: Secondary | ICD-10-CM | POA: Diagnosis not present

## 2024-04-01 DIAGNOSIS — M5416 Radiculopathy, lumbar region: Secondary | ICD-10-CM

## 2024-04-01 NOTE — Patient Instructions (Signed)
 It was so nice to see you today. Thank you so much for coming in.    You have wear and tear in your lower back with degeneration of the discs and spinal stenosis (pressure on the spinal cord).   I ordered xrays of your lower back. I will send you a message with the results.   You may be a surgery candidate, but we need to do PT for at least 6 weeks prior to any discussion of surgery.   I sent physical therapy orders to [facility]. You can call them at [phone] to schedule your visit.   I will message you in 4 weeks to check on your progress with PT and then likely schedule you to see Dr. Claudene.   You can proceed with the injections with [facility] if you want to see if you can get some pain relief. This will not change anything regarding possible surgery.   Please do not hesitate to call if you have any questions or concerns. You can also message me in MyChart.   Glade Boys PA-C [phone]     The physicians and staff at [facility] at [facility] are committed to providing excellent care. You may receive a survey asking for feedback about your experience at our office. We value you your feedback and appreciate you taking the time to to fill it out. The [facility] leadership team is also available to discuss your experience in person, feel free to contact us  [phone].

## 2024-04-01 NOTE — Progress Notes (Signed)
   CHIEF COMPLAINT  UA/ only visit fot UTI     REASON FOR VISIT  Possible UTI, UA Visit Only      ASSESSMENT & PLAN Diagnoses and all orders for this visit:  Urinary frequency -     POCT Urinalysis Dipstick (18997) -     Urinalysis, Routine w reflex microscopic -     Urine Culture     Patient notified.  Total time spent: 5 minutes  ALAN CHRISTELLA ARRANT, FNP 03/29/2024

## 2024-04-03 ENCOUNTER — Encounter: Payer: Self-pay | Admitting: Orthopedic Surgery

## 2024-04-03 LAB — URINE CULTURE

## 2024-04-03 NOTE — Telephone Encounter (Signed)
 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 Mild dextroscoliosis. Straightening of lumbar lordosis. Vertebral body heights are maintained. Advanced disc space narrowing at L2-L3, L3-L4 and L4-L5 with endplate sclerosis and bulky osteophytes. Moderate disc space narrowing T12-L1 and L1-L2. No suspicious change in alignment with flexion or extension.   IMPRESSION: 1. 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 2. Mild scoliosis with straightening of lumbar lordosis. Advanced degenerative changes of the mid to lower lumbar spine.     Electronically Signed   By: Luke Bun M.D.   On: 04/03/2024 20:36   I have personally reviewed the images and agree with the above interpretation.

## 2024-04-08 ENCOUNTER — Other Ambulatory Visit: Payer: Self-pay

## 2024-04-08 ENCOUNTER — Ambulatory Visit: Payer: Self-pay

## 2024-04-08 MED ORDER — NITROFURANTOIN MONOHYD MACRO 100 MG PO CAPS: 100.0000 mg | ORAL_CAPSULE | Freq: Two times a day (BID) | ORAL | 0 refills | Status: AC

## 2024-05-27 ENCOUNTER — Other Ambulatory Visit: Payer: Self-pay | Admitting: Family

## 2024-06-07 ENCOUNTER — Ambulatory Visit: Admitting: Family

## 2024-06-07 ENCOUNTER — Encounter: Payer: Self-pay | Admitting: Family

## 2024-06-07 VITALS — BP 128/80 | HR 97 | Ht 64.5 in | Wt 223.0 lb

## 2024-06-07 DIAGNOSIS — E782 Mixed hyperlipidemia: Secondary | ICD-10-CM | POA: Diagnosis not present

## 2024-06-07 DIAGNOSIS — R7303 Prediabetes: Secondary | ICD-10-CM

## 2024-06-07 DIAGNOSIS — N401 Enlarged prostate with lower urinary tract symptoms: Secondary | ICD-10-CM

## 2024-06-07 DIAGNOSIS — I10 Essential (primary) hypertension: Secondary | ICD-10-CM

## 2024-06-07 DIAGNOSIS — R338 Other retention of urine: Secondary | ICD-10-CM

## 2024-06-07 DIAGNOSIS — E538 Deficiency of other specified B group vitamins: Secondary | ICD-10-CM | POA: Diagnosis not present

## 2024-06-07 DIAGNOSIS — R5383 Other fatigue: Secondary | ICD-10-CM

## 2024-06-07 DIAGNOSIS — E559 Vitamin D deficiency, unspecified: Secondary | ICD-10-CM | POA: Diagnosis not present

## 2024-06-07 MED ORDER — SEMAGLUTIDE-WEIGHT MANAGEMENT 0.25 MG/0.5ML ~~LOC~~ SOAJ: 0.2500 mg | SUBCUTANEOUS | 0 refills | Status: AC

## 2024-06-07 MED ORDER — SEMAGLUTIDE-WEIGHT MANAGEMENT 2.4 MG/0.75ML ~~LOC~~ SOAJ: 2.4000 mg | SUBCUTANEOUS | 1 refills | Status: AC

## 2024-06-07 MED ORDER — SEMAGLUTIDE-WEIGHT MANAGEMENT 1.7 MG/0.75ML ~~LOC~~ SOAJ: 1.7000 mg | SUBCUTANEOUS | 0 refills | Status: AC

## 2024-06-07 MED ORDER — SEMAGLUTIDE-WEIGHT MANAGEMENT 0.5 MG/0.5ML ~~LOC~~ SOAJ: 0.5000 mg | SUBCUTANEOUS | 0 refills | Status: AC

## 2024-06-07 MED ORDER — SEMAGLUTIDE-WEIGHT MANAGEMENT 1 MG/0.5ML ~~LOC~~ SOAJ: 1.0000 mg | SUBCUTANEOUS | 0 refills | Status: AC

## 2024-06-07 NOTE — Progress Notes (Signed)
 "  Established Patient Office Visit  Subjective:  Patient ID: Vincent Cole, male    DOB: 1950/11/22  Age: 74 y.o. MRN: [id number]  Chief Complaint  Patient presents with   Follow-up    Discuss weight loss meds    Patient is here today to discuss weight loss options.  He says that as he has been gaining more weight, his knees have begun bothering him more.  His daughter is currently on Wegovy  and he asks if we can try him on this too.   He also has been having some additional prostate symptoms, now is having urinary hesitancy/infrequency rather than frequency and nocturia.  He asks if we can figure out any reason for this change.  He is due for labs today.       No other concerns at this time.   Past Medical History:  Diagnosis Date   Abdominal pain 01/17/2011   Formatting of this note might be different from the original.  Note: Unchanged - chronic  suprapubic/lower abdominal area since TURP in 2005; urology has now diagnosed, after extensive evaluation, interstitial cystitis; has also had repeat TURP in June 2010, and a surgery on the neck of the bladder in July of 2011     Actinic keratosis    Arthritis    Benign bladder mass    Complete tear of left rotator cuff 04/19/2017   Complication of anesthesia    PONV x 1 in 2010   Deep venous thrombosis (HCC) 12/30/2023   Depression    Frequency of urination    GERD (gastroesophageal reflux disease) no meds   Heart murmur    was told many years ago   History of DVT of lower extremity 02/03/2020   History of recurrent UTIs    Hypertension    IC (interstitial cystitis)    Nocturia    OSA on CPAP nightly   Pelvic pain in male    PONV (postoperative nausea and vomiting)    Simple renal cyst    Urgency of urination    Urinary hesitancy     Past Surgical History:  Procedure Laterality Date   BUNIONECTOMY Right 01/15/2021   Procedure: ZELL HAI;  Surgeon: Ashley Soulier, DPM;  Location: [facility];  Service:  Podiatry;  Laterality: Right;   COLONOSCOPY     COLONOSCOPY WITH PROPOFOL  N/A 06/25/2021   Procedure: COLONOSCOPY WITH PROPOFOL ;  Surgeon: Maryruth Ole DASEN, MD;  Location: [facility];  Service: Endoscopy;  Laterality: N/A;   CORRECTION OVERLAPPING TOES Right 01/15/2021   Procedure: CORRECTION OVERLAPPING TOES;  Surgeon: Ashley Soulier, DPM;  Location: [facility];  Service: Podiatry;  Laterality: Right;   CYSTO WITH HYDRODISTENSION  06/02/2011   Procedure: CYSTOSCOPY/HYDRODISTENSION;  Surgeon: Glendia DELENA Elizabeth, MD;  Location: [facility];  Service: Urology;  Laterality: N/A;  catheter placement   HAMMER TOE SURGERY Right 01/15/2021   Procedure: HAMMER TOE CORRECTION;  Surgeon: Ashley Soulier, DPM;  Location: [facility];  Service: Podiatry;  Laterality: Right;   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   JOINT REPLACEMENT     shoulder, left knee   KNEE ARTHROSCOPY     TRANSURETHRAL RESECTION OF PROSTATE  X3   LAST ONE MAY 2012   WEIL OSTEOTOMY Right 01/15/2021   Procedure: WEIL;  Surgeon: Ashley Soulier, DPM;  Location: [facility];  Service: Podiatry;  Laterality: Right;    Social History   Socioeconomic History   Marital status: Married    Spouse name: Not on file  Number of children: 1   Years of education: Not on file   Highest education level: Not on file  Occupational History   Not on file  Tobacco Use   Smoking status: Never   Smokeless tobacco: Never  Vaping Use   Vaping status: Never Used  Substance and Sexual Activity   Alcohol use: Yes    Comment: occ   Drug use: No   Sexual activity: Not on file  Other Topics Concern   Not on file  Social History Narrative   Not on file   Social Drivers of Health   Tobacco Use: Low Risk (06/07/2024)   Patient History    Smoking Tobacco Use: Never    Smokeless Tobacco Use: Never    Passive Exposure: Not on file  Financial Resource Strain: Medium Risk (02/27/2024)   Received from [facility]   Overall Financial Resource Strain (CARDIA)    Difficulty of Paying Living Expenses: Somewhat hard  Food Insecurity: No Food Insecurity (02/27/2024)   Received from [facility]   Epic    Within the past 12 months, you worried that your food would run out before you got the money to buy more.: Never true    Within the past 12 months, the food you bought just didn't last and you didn't have money to get more.: Never true  Transportation Needs: No Transportation Needs (02/27/2024)   Received from [facility] - Transportation    In the past 12 months, has lack of transportation kept you from medical appointments or from getting medications?: No    Lack of Transportation (Non-Medical): No  Physical Activity: Not on file  Stress: No Stress Concern Present (05/22/2023)   [employer] of Occupational [employer] - Occupational Stress Questionnaire    Feeling of Stress : Only a little  Social Connections: Moderately Isolated (01/08/2024)   Social Connection and Isolation Panel    Frequency of Communication with Friends and Family: More than three times a week    Frequency of Social Gatherings with Friends and Family: More than three times a week    Attends Religious Services: Never    Database Administrator or Organizations: No    Attends Banker Meetings: Never    Marital Status: Married  Catering Manager Violence: Not At Risk (01/08/2024)   Epic    Fear of Current or Ex-Partner: No    Emotionally Abused: No    Physically Abused: No    Sexually Abused: No  Depression (PHQ2-9): High Risk (05/22/2023)   Depression (PHQ2-9)    PHQ-2 Score: 18  Alcohol Screen: Low Risk (05/22/2023)   Alcohol Screen    Last Alcohol Screening Score (AUDIT): 0  Housing: Low Risk  (06/04/2024)   Received from [facility]   Epic    In the last 12 months, was there a time when you were not able to pay the mortgage or rent on time?:  No    In the past 12 months, how many times have you moved where you were living?: 0    At any time in the past 12 months, were you homeless or living in a shelter (including now)?: No  Utilities: Not At Risk (02/27/2024)   Received from [facility]   Epic    In the past 12 months has the electric, gas, oil, or water  company threatened to shut off services in your home?: No  Health  Literacy: Adequate Health Literacy (05/22/2023)   B1300 Health Literacy    Frequency of need for help with medical instructions: Never    Family History  Problem Relation Age of Onset   Heart disease Mother     Allergies[1]  Review of Systems  All other systems reviewed and are negative.      Objective:   BP 128/80   Pulse 97   Ht 5' 4.5 (1.638 m)   Wt 223 lb (101.2 kg)   SpO2 96%   BMI 37.69 kg/m   Vitals:   06/07/24 1020  BP: 128/80  Pulse: 97  Height: 5' 4.5 (1.638 m)  Weight: 223 lb (101.2 kg)  SpO2: 96%  BMI (Calculated): 37.7    Physical Exam Vitals and nursing note reviewed.  Constitutional:      Appearance: Normal appearance. He is normal weight.  Eyes:     Pupils: Pupils are equal, round, and reactive to light.  Cardiovascular:     Rate and Rhythm: Normal rate and regular rhythm.     Pulses: Normal pulses.     Heart sounds: Normal heart sounds.  Pulmonary:     Effort: Pulmonary effort is normal.     Breath sounds: Normal breath sounds.  Neurological:     General: No focal deficit present.     Mental Status: He is alert and oriented to person, place, and time. Mental status is at baseline.  Psychiatric:        Mood and Affect: Mood normal.        Behavior: Behavior normal.        Thought Content: Thought content normal.        Judgment: Judgment normal.      No results found for any visits on 06/07/24.  Recent Results (from the past 2160 hours)  POCT Urinalysis Dipstick (18997)     Status: None   Collection Time: 03/29/24  1:47 PM  Result  Value Ref Range   Color, UA Yellow    Clarity, UA Clear    Glucose, UA Negative Negative   Bilirubin, UA Negative    Ketones, UA Negative    Spec Grav, UA 1.020 1.010 - 1.025   Blood, UA Negative    pH, UA 7.0 5.0 - 8.0   Protein, UA Negative Negative   Urobilinogen, UA 1.0 0.2 or 1.0 E.U./dL   Nitrite, UA Negative    Leukocytes, UA Negative Negative   Appearance Clear    Odor Yes   Urinalysis, Routine w reflex microscopic     Status: None   Collection Time: 03/29/24  3:39 PM  Result Value Ref Range   Specific Gravity, UA 1.021 1.005 - 1.030   pH, UA 7.0 5.0 - 7.5   Color, UA Yellow Yellow   Appearance Ur Clear Clear   Leukocytes,UA Negative Negative   Protein,UA Negative Negative/Trace   Glucose, UA Negative Negative   Ketones, UA Negative Negative   RBC, UA Negative Negative   Bilirubin, UA Negative Negative   Urobilinogen, Ur 1.0 0.2 - 1.0 mg/dL   Nitrite, UA Negative Negative   Microscopic Examination Comment     Comment: Microscopic not indicated and not performed.  Urine Culture     Status: Abnormal   Collection Time: 03/29/24  3:40 PM   Specimen: Urine, Clean Catch   UR  Result Value Ref Range   Urine Culture, Routine Final report (A)    Organism ID, Bacteria Enterococcus faecalis (A)     Comment: Enterococci  susceptible to penicillin are predictably susceptible to ampicillin, amoxicillin, ampicillin-sulbactam, amoxicillin-clavulanate, and piperacillin-tazobactam for non-beta-lactamase producing enterococci. (CLSI 2018) For Enterococcus species, aminoglycosides (except for high-level resistance screening), cephalosporins, clindamycin, and trimethoprim -sulfamethoxazole  are not effective clinically. (CLSI, M100-S26, 2016) 10,000-25,000 colony forming units per mL    Antimicrobial Susceptibility Comment     Comment:       ** S = Susceptible; I = Intermediate; R = Resistant **                    P = Positive; N = Negative             MICS are expressed in  micrograms per mL    Antibiotic                 RSLT#1    RSLT#2    RSLT#3    RSLT#4 Ciprofloxacin                   S Levofloxacin                   S Nitrofurantoin                  S Penicillin                     S Tetracycline                   R Vancomycin                     S        Assessment & Plan Benign prostatic hyperplasia with urinary retention Checking PSA today to evaluate new symptoms.  Will call with results and consider referral to urology if needed.   Mixed hyperlipidemia Checking labs today.  Continue current therapy for lipid control. Will modify as needed based on labwork results.   -CMP w/eGFR -Lipid Panel  Primary hypertension Blood pressure well controlled with current medications.  Continue current therapy.  Will reassess at follow up.   - CBC w/Diff - CMP w/eGFR  Prediabetes A1C Continues to be in prediabetic ranges.  Will reassess at follow up after next lab check.  Patient counseled on dietary choices and verbalized understanding.   -CBC w/Diff -CMP w/eGFR -Hemoglobin A1C  Vitamin D  deficiency, unspecified B12 deficiency due to diet Other fatigue Checking labs today.  Will continue supplements as needed.   - Vitamin D  - Vitamin B12 - TSH  Morbid obesity (HCC) Starting patient on Wegovy .  Will recheck his at his follow up for side effects.     Return in about 1 month (around 07/08/2024).   Total time spent: 20 minutes  ALAN CHRISTELLA ARRANT, FNP  06/07/2024   This document may have been prepared by [facility] Voice Recognition software and as such may include unintentional dictation errors.      [1] No Known Allergies  "

## 2024-06-08 ENCOUNTER — Encounter: Payer: Self-pay | Admitting: Family

## 2024-06-08 LAB — LIPID PANEL
Chol/HDL Ratio: 2 ratio (ref 0.0–5.0)
Cholesterol, Total: 168 mg/dL (ref 100–199)
HDL: 82 mg/dL
LDL Chol Calc (NIH): 71 mg/dL (ref 0–99)
Triglycerides: 84 mg/dL (ref 0–149)
VLDL Cholesterol Cal: 15 mg/dL (ref 5–40)

## 2024-06-08 LAB — CMP14+EGFR
ALT: 29 IU/L (ref 0–44)
AST: 37 IU/L (ref 0–40)
Albumin: 4.3 g/dL (ref 3.8–4.8)
Alkaline Phosphatase: 98 IU/L (ref 47–123)
BUN/Creatinine Ratio: 12 (ref 10–24)
BUN: 13 mg/dL (ref 8–27)
Bilirubin Total: 0.3 mg/dL (ref 0.0–1.2)
CO2: 24 mmol/L (ref 20–29)
Calcium: 9.4 mg/dL (ref 8.6–10.2)
Chloride: 98 mmol/L (ref 96–106)
Creatinine, Ser: 1.12 mg/dL (ref 0.76–1.27)
Globulin, Total: 2.5 g/dL (ref 1.5–4.5)
Glucose: 92 mg/dL (ref 70–99)
Potassium: 4.3 mmol/L (ref 3.5–5.2)
Sodium: 137 mmol/L (ref 134–144)
Total Protein: 6.8 g/dL (ref 6.0–8.5)
eGFR: 69 mL/min/1.73

## 2024-06-08 LAB — TSH: TSH: 1.38 u[IU]/mL (ref 0.450–4.500)

## 2024-06-08 LAB — VITAMIN D 25 HYDROXY (VIT D DEFICIENCY, FRACTURES): Vit D, 25-Hydroxy: 34.4 ng/mL (ref 30.0–100.0)

## 2024-06-08 LAB — HEMOGLOBIN A1C
Est. average glucose Bld gHb Est-mCnc: 105 mg/dL
Hgb A1c MFr Bld: 5.3 % (ref 4.8–5.6)

## 2024-06-08 LAB — PSA: Prostate Specific Ag, Serum: 0.8 ng/mL (ref 0.0–4.0)

## 2024-06-08 LAB — VITAMIN B12: Vitamin B-12: 1360 pg/mL — ABNORMAL HIGH (ref 232–1245)

## 2024-06-08 NOTE — Assessment & Plan Note (Signed)
 Blood pressure well controlled with current medications.  Continue current therapy.  Will reassess at follow up.   - CBC w/Diff - CMP w/eGFR

## 2024-06-08 NOTE — Assessment & Plan Note (Signed)
 Checking PSA today to evaluate new symptoms.  Will call with results and consider referral to urology if needed.

## 2024-06-08 NOTE — Assessment & Plan Note (Signed)
 Starting patient on Wegovy .  Will recheck his at his follow up for side effects.

## 2024-06-08 NOTE — Assessment & Plan Note (Signed)
 Checking labs today.  Continue current therapy for lipid control. Will modify as needed based on labwork results.   -CMP w/eGFR -Lipid Panel

## 2024-06-19 ENCOUNTER — Other Ambulatory Visit: Payer: Self-pay | Admitting: Family

## 2024-06-27 ENCOUNTER — Ambulatory Visit: Payer: Self-pay

## 2024-06-27 NOTE — Telephone Encounter (Signed)
 Please see if you can change his f/u appt. See mychart message.

## 2024-07-09 ENCOUNTER — Ambulatory Visit: Admitting: Family

## 2024-07-15 ENCOUNTER — Ambulatory Visit: Admitting: Orthopedic Surgery

## 2024-12-05 ENCOUNTER — Ambulatory Visit: Admitting: Dermatology
